# Patient Record
Sex: Female | Born: 1945 | Race: Black or African American | Hispanic: No | Marital: Married | Smoking: Former smoker
Health system: Southern US, Community
[De-identification: ages and names within clinical notes are randomized; demographics above are authoritative.]

## PROBLEM LIST (undated history)

## (undated) DIAGNOSIS — K2970 Gastritis, unspecified, without bleeding: Secondary | ICD-10-CM

## (undated) DIAGNOSIS — D649 Anemia, unspecified: Secondary | ICD-10-CM

## (undated) DIAGNOSIS — D573 Sickle-cell trait: Secondary | ICD-10-CM

## (undated) DIAGNOSIS — B029 Zoster without complications: Secondary | ICD-10-CM

## (undated) DIAGNOSIS — T7840XA Allergy, unspecified, initial encounter: Secondary | ICD-10-CM

## (undated) DIAGNOSIS — K449 Diaphragmatic hernia without obstruction or gangrene: Secondary | ICD-10-CM

## (undated) DIAGNOSIS — R519 Headache, unspecified: Secondary | ICD-10-CM

## (undated) DIAGNOSIS — Z8619 Personal history of other infectious and parasitic diseases: Secondary | ICD-10-CM

## (undated) DIAGNOSIS — N12 Tubulo-interstitial nephritis, not specified as acute or chronic: Secondary | ICD-10-CM

## (undated) DIAGNOSIS — N189 Chronic kidney disease, unspecified: Secondary | ICD-10-CM

## (undated) DIAGNOSIS — E785 Hyperlipidemia, unspecified: Secondary | ICD-10-CM

## (undated) DIAGNOSIS — R51 Headache: Secondary | ICD-10-CM

## (undated) DIAGNOSIS — Z87440 Personal history of urinary (tract) infections: Secondary | ICD-10-CM

## (undated) HISTORY — DX: Personal history of urinary (tract) infections: Z87.440

## (undated) HISTORY — DX: Hyperlipidemia, unspecified: E78.5

## (undated) HISTORY — DX: Allergy, unspecified, initial encounter: T78.40XA

## (undated) HISTORY — DX: Gastritis, unspecified, without bleeding: K29.70

## (undated) HISTORY — DX: Zoster without complications: B02.9

## (undated) HISTORY — DX: Personal history of other infectious and parasitic diseases: Z86.19

## (undated) HISTORY — DX: Chronic kidney disease, unspecified: N18.9

## (undated) HISTORY — PX: COLONOSCOPY: SHX174

## (undated) HISTORY — DX: Sickle-cell trait: D57.3

## (undated) HISTORY — PX: WISDOM TOOTH EXTRACTION: SHX21

## (undated) HISTORY — PX: UPPER GASTROINTESTINAL ENDOSCOPY: SHX188

## (undated) HISTORY — DX: Tubulo-interstitial nephritis, not specified as acute or chronic: N12

## (undated) HISTORY — DX: Diaphragmatic hernia without obstruction or gangrene: K44.9

## (undated) HISTORY — DX: Anemia, unspecified: D64.9

---

## 2002-12-11 ENCOUNTER — Emergency Department (HOSPITAL_COMMUNITY): Admission: EM | Admit: 2002-12-11 | Discharge: 2002-12-11 | Payer: Self-pay | Admitting: Emergency Medicine

## 2003-03-24 ENCOUNTER — Encounter: Admission: RE | Admit: 2003-03-24 | Discharge: 2003-03-24 | Payer: Self-pay | Admitting: Internal Medicine

## 2004-06-21 ENCOUNTER — Emergency Department (HOSPITAL_COMMUNITY): Admission: EM | Admit: 2004-06-21 | Discharge: 2004-06-21 | Payer: Self-pay | Admitting: Emergency Medicine

## 2004-10-12 ENCOUNTER — Encounter: Admission: RE | Admit: 2004-10-12 | Discharge: 2004-10-12 | Payer: Self-pay | Admitting: Internal Medicine

## 2005-12-05 ENCOUNTER — Ambulatory Visit: Payer: Self-pay | Admitting: Internal Medicine

## 2005-12-21 ENCOUNTER — Ambulatory Visit: Payer: Self-pay | Admitting: Internal Medicine

## 2005-12-21 LAB — CONVERTED CEMR LAB
Alkaline Phosphatase: 90 units/L (ref 39–117)
Basophils Relative: 0.1 % (ref 0.0–1.0)
CO2: 29 meq/L (ref 19–32)
Calcium: 9.8 mg/dL (ref 8.4–10.5)
Chol/HDL Ratio, serum: 2.9
Cholesterol: 185 mg/dL (ref 0–200)
Creatinine, Ser: 0.9 mg/dL (ref 0.4–1.2)
Eosinophil percent: 1 % (ref 0.0–5.0)
Glomerular Filtration Rate, Af Am: 82 mL/min/{1.73_m2}
Glucose, Bld: 88 mg/dL (ref 70–99)
LDL Cholesterol: 110 mg/dL — ABNORMAL HIGH (ref 0–99)
MCHC: 32.4 g/dL (ref 30.0–36.0)
MCV: 91.9 fL (ref 78.0–100.0)
Neutrophils Relative %: 57.1 % (ref 43.0–77.0)
Potassium: 3.8 meq/L (ref 3.5–5.1)
RBC: 4.21 M/uL (ref 3.87–5.11)
RDW: 12.5 % (ref 11.5–14.6)
Sodium: 146 meq/L — ABNORMAL HIGH (ref 135–145)
Triglyceride fasting, serum: 60 mg/dL (ref 0–149)
WBC: 5.5 10*3/uL (ref 4.5–10.5)

## 2005-12-29 ENCOUNTER — Encounter: Payer: Self-pay | Admitting: Internal Medicine

## 2005-12-29 ENCOUNTER — Ambulatory Visit: Payer: Self-pay | Admitting: Internal Medicine

## 2005-12-29 ENCOUNTER — Other Ambulatory Visit: Admission: RE | Admit: 2005-12-29 | Discharge: 2005-12-29 | Payer: Self-pay | Admitting: Internal Medicine

## 2007-02-11 ENCOUNTER — Ambulatory Visit: Payer: Self-pay | Admitting: Internal Medicine

## 2007-02-11 LAB — CONVERTED CEMR LAB
Glucose, Urine, Semiquant: NEGATIVE
Ketones, urine, test strip: NEGATIVE
Specific Gravity, Urine: 1.02
Urobilinogen, UA: 0.2
pH: 6

## 2007-02-12 LAB — CONVERTED CEMR LAB
ALT: 20 units/L (ref 0–35)
AST: 27 units/L (ref 0–37)
BUN: 8 mg/dL (ref 6–23)
Basophils Absolute: 0 10*3/uL (ref 0.0–0.1)
Bilirubin, Direct: 0.2 mg/dL (ref 0.0–0.3)
CO2: 27 meq/L (ref 19–32)
Calcium: 9.5 mg/dL (ref 8.4–10.5)
Creatinine, Ser: 1 mg/dL (ref 0.4–1.2)
Eosinophils Absolute: 0.1 10*3/uL (ref 0.0–0.6)
Eosinophils Relative: 1.6 % (ref 0.0–5.0)
Hemoglobin: 11.9 g/dL — ABNORMAL LOW (ref 12.0–15.0)
Monocytes Relative: 9.8 % (ref 3.0–11.0)
Neutrophils Relative %: 58.3 % (ref 43.0–77.0)
RDW: 13.3 % (ref 11.5–14.6)
Sodium: 138 meq/L (ref 135–145)
TSH: 1.93 microintl units/mL (ref 0.35–5.50)
Total CHOL/HDL Ratio: 2.5
Triglycerides: 43 mg/dL (ref 0–149)
VLDL: 9 mg/dL (ref 0–40)

## 2007-02-18 ENCOUNTER — Ambulatory Visit: Payer: Self-pay | Admitting: Internal Medicine

## 2007-02-18 ENCOUNTER — Other Ambulatory Visit: Admission: RE | Admit: 2007-02-18 | Discharge: 2007-02-18 | Payer: Self-pay | Admitting: Internal Medicine

## 2007-02-18 ENCOUNTER — Encounter: Payer: Self-pay | Admitting: Internal Medicine

## 2007-02-18 DIAGNOSIS — M25569 Pain in unspecified knee: Secondary | ICD-10-CM

## 2007-02-18 DIAGNOSIS — D649 Anemia, unspecified: Secondary | ICD-10-CM | POA: Insufficient documentation

## 2007-02-18 LAB — CONVERTED CEMR LAB: Pap Smear: NORMAL

## 2007-02-21 LAB — CONVERTED CEMR LAB
Basophils Absolute: 0 10*3/uL (ref 0.0–0.1)
Basophils Relative: 0.3 % (ref 0.0–1.0)
Eosinophils Absolute: 0.1 10*3/uL (ref 0.0–0.6)
HCT: 35.4 % — ABNORMAL LOW (ref 36.0–46.0)
Hemoglobin: 11.7 g/dL — ABNORMAL LOW (ref 12.0–15.0)
MCHC: 33.1 g/dL (ref 30.0–36.0)
Neutro Abs: 3.4 10*3/uL (ref 1.4–7.7)
Neutrophils Relative %: 60.3 % (ref 43.0–77.0)
RBC: 3.87 M/uL (ref 3.87–5.11)
WBC: 5.6 10*3/uL (ref 4.5–10.5)

## 2007-02-26 ENCOUNTER — Ambulatory Visit: Payer: Self-pay | Admitting: Gastroenterology

## 2007-03-07 ENCOUNTER — Encounter: Payer: Self-pay | Admitting: Internal Medicine

## 2007-03-21 ENCOUNTER — Encounter: Admission: RE | Admit: 2007-03-21 | Discharge: 2007-03-21 | Payer: Self-pay | Admitting: Internal Medicine

## 2007-03-21 LAB — HM MAMMOGRAPHY: HM Mammogram: NORMAL

## 2007-04-05 ENCOUNTER — Encounter: Payer: Self-pay | Admitting: Internal Medicine

## 2007-04-11 ENCOUNTER — Ambulatory Visit: Payer: Self-pay | Admitting: Gastroenterology

## 2007-04-11 ENCOUNTER — Encounter: Payer: Self-pay | Admitting: Internal Medicine

## 2007-04-11 LAB — HM COLONOSCOPY: HM Colonoscopy: NORMAL

## 2007-06-12 ENCOUNTER — Ambulatory Visit: Payer: Self-pay | Admitting: Internal Medicine

## 2009-04-05 ENCOUNTER — Ambulatory Visit: Payer: Self-pay | Admitting: Internal Medicine

## 2009-04-05 LAB — CONVERTED CEMR LAB
ALT: 12 units/L (ref 0–35)
AST: 21 units/L (ref 0–37)
Albumin: 3.6 g/dL (ref 3.5–5.2)
Bilirubin Urine: NEGATIVE
Blood in Urine, dipstick: NEGATIVE
Cholesterol: 197 mg/dL (ref 0–200)
Eosinophils Absolute: 0.1 10*3/uL (ref 0.0–0.7)
GFR calc non Af Amer: 93.26 mL/min (ref >60)
Glucose, Bld: 78 mg/dL (ref 70–99)
HCT: 34.2 % — ABNORMAL LOW (ref 36.0–46.0)
HDL: 76.6 mg/dL (ref >39.00)
Ketones, urine, test strip: NEGATIVE
Lymphocytes Relative: 32.3 % (ref 12.0–46.0)
MCHC: 32.7 g/dL (ref 30.0–36.0)
MCV: 94.8 fL (ref 78.0–100.0)
Monocytes Relative: 8.3 % (ref 3.0–12.0)
Nitrite: NEGATIVE
Platelets: 224 10*3/uL (ref 150.0–400.0)
RBC: 3.6 M/uL — ABNORMAL LOW (ref 3.87–5.11)
RDW: 13.2 % (ref 11.5–14.6)
Specific Gravity, Urine: 1.02
TSH: 1.26 microintl units/mL (ref 0.35–5.50)
Total CHOL/HDL Ratio: 3
Total Protein: 6.9 g/dL (ref 6.0–8.3)
Triglycerides: 55 mg/dL (ref 0.0–149.0)
Urobilinogen, UA: 0.2
WBC: 5.4 10*3/uL (ref 4.5–10.5)
pH: 5.5

## 2009-04-12 ENCOUNTER — Ambulatory Visit: Payer: Self-pay | Admitting: Internal Medicine

## 2009-04-12 ENCOUNTER — Other Ambulatory Visit
Admission: RE | Admit: 2009-04-12 | Discharge: 2009-04-12 | Payer: Self-pay | Source: Home / Self Care | Admitting: Internal Medicine

## 2009-04-12 DIAGNOSIS — J309 Allergic rhinitis, unspecified: Secondary | ICD-10-CM | POA: Insufficient documentation

## 2009-04-12 LAB — CONVERTED CEMR LAB
Bilirubin Urine: NEGATIVE
Blood in Urine, dipstick: NEGATIVE
Glucose, Urine, Semiquant: NEGATIVE
Ketones, urine, test strip: NEGATIVE
Protein, U semiquant: NEGATIVE

## 2009-04-12 LAB — HM PAP SMEAR

## 2009-04-13 ENCOUNTER — Telehealth: Payer: Self-pay | Admitting: Internal Medicine

## 2009-04-14 LAB — CONVERTED CEMR LAB: Pap Smear: NEGATIVE

## 2009-06-08 ENCOUNTER — Ambulatory Visit: Payer: Self-pay | Admitting: Internal Medicine

## 2009-06-08 DIAGNOSIS — D573 Sickle-cell trait: Secondary | ICD-10-CM

## 2009-06-08 LAB — CONVERTED CEMR LAB
Basophils Absolute: 0 10*3/uL (ref 0.0–0.1)
IgA: 183 mg/dL (ref 68–378)
Iron: 57 ug/dL (ref 42–145)
Lymphocytes Relative: 25.2 % (ref 12.0–46.0)
Lymphs Abs: 1.7 10*3/uL (ref 0.7–4.0)
MCV: 93 fL (ref 78.0–100.0)
Monocytes Absolute: 0.6 10*3/uL (ref 0.1–1.0)
RBC: 3.69 M/uL — ABNORMAL LOW (ref 3.87–5.11)
Transferrin: 245.8 mg/dL (ref 212.0–360.0)

## 2009-06-16 LAB — CONVERTED CEMR LAB
Hgb A2 Quant: 3.2 % (ref 2.2–3.2)
Hgb A: 60.1 % — ABNORMAL LOW (ref 96.8–97.8)

## 2009-06-25 ENCOUNTER — Encounter (INDEPENDENT_AMBULATORY_CARE_PROVIDER_SITE_OTHER): Payer: Self-pay | Admitting: *Deleted

## 2009-09-13 ENCOUNTER — Ambulatory Visit: Payer: Self-pay | Admitting: Gastroenterology

## 2009-09-13 DIAGNOSIS — D509 Iron deficiency anemia, unspecified: Secondary | ICD-10-CM

## 2009-10-06 ENCOUNTER — Ambulatory Visit: Payer: Self-pay | Admitting: Gastroenterology

## 2009-10-07 ENCOUNTER — Ambulatory Visit: Payer: Self-pay | Admitting: Gastroenterology

## 2009-10-08 ENCOUNTER — Encounter: Payer: Self-pay | Admitting: Gastroenterology

## 2009-10-08 LAB — CONVERTED CEMR LAB
Fecal Occult Blood: NEGATIVE
OCCULT 3: NEGATIVE

## 2010-02-13 ENCOUNTER — Encounter: Payer: Self-pay | Admitting: Internal Medicine

## 2010-02-22 NOTE — Assessment & Plan Note (Signed)
Summary: anemia...em   History of Present Illness Visit Type: Initial Consult Primary GI MD: Elie Goody MD [facility] Primary Provider: Berniece Andreas, MD Requesting Provider: Berniece Andreas, MD Chief Complaint: Iron Def Anemia, patient states that this has been an ongoing problem most of her adult life. History of Present Illness:   This is a 65 year old female who has a history of recurrent iron deficiency anemia and sickle cell trait. She states she was placed on iron earlier this year. Despite taking iron for several months, she remains iron deficient with a recent hemoglobin of 11.6. She underwent colonoscopy in March 2009, which was normal. She has no ongoing gastrointestinal complaints. Celiac antibodies were recently negative.  Iron                            57 ug/dL                    11-914 Transferrin               245.8 mg/dL                 782.9-562.1 Iron Saturation      [L]  16.6 %                      20.0-50.0   GI Review of Systems      Denies abdominal pain, acid reflux, belching, bloating, chest pain, dysphagia with liquids, dysphagia with solids, heartburn, loss of appetite, nausea, vomiting, vomiting blood, weight loss, and  weight gain.        Denies anal fissure, black tarry stools, change in bowel habit, constipation, diarrhea, diverticulosis, fecal incontinence, heme positive stool, hemorrhoids, irritable bowel syndrome, jaundice, light color stool, liver problems, rectal bleeding, and  rectal pain.   Current Medications (verified): 1)  Vitamin D 1000 Unit  Tabs (Cholecalciferol) 2)  Multivitamins   Tabs (Multiple Vitamin) 3)  Nasonex 50 Mcg/act Susp (Mometasone Furoate) .... 2 Sprays in Each Nostril Once Daily 4)  Iron .Marland Kitchen.. 65 Mg 1 X A Day  Allergies (verified): 1)  ! Penicillin  Past History:  Past Medical History: G3P3 chickenpox Allergic rhinitis Hyperlipidemia Urinary Tract Infection Sickle cell trait Fe def anemia  Past Surgical  History: Reviewed history from 02/18/2007 and no changes required. Denies surgical history  Family History: Reviewed history from 06/08/2009 and no changes required. Family History Hypertension mom chf   Raised  not by biologic     Social History: Reviewed history from 04/12/2009 and no changes required. divorced  Former Smoker Alcohol use-yes Drug use-no Regular exercise-yes Occupation:cma   night shift.   Household of 1 no pets [place] Raised by family member not mom. Divorced  Review of Systems       The patient complains of allergy/sinus, change in vision, and headaches-new.  The patient denies anemia, anxiety-new, arthritis/joint pain, back pain, blood in urine, breast changes/lumps, confusion, cough, coughing up blood, depression-new, fainting, fatigue, fever, hearing problems, heart murmur, heart rhythm changes, itching, menstrual pain, muscle pains/cramps, night sweats, nosebleeds, pregnancy symptoms, shortness of breath, skin rash, sleeping problems, sore throat, swelling of feet/legs, swollen lymph glands, thirst - excessive, urination - excessive, urination changes/pain, urine leakage, vision changes, and voice change.    Vital Signs:  Patient profile:   65 year old female Menstrual status:  postmenopausal Height:      66 inches Weight:  182.25 pounds BMI:     29.52 Pulse rate:   64 / minute Pulse rhythm:   regular BP sitting:   110 / 68  (left arm) Cuff size:   regular  Vitals Entered By: June McMurray [name] Duncan Dull) (September 13, 2009 9:47 AM)  Physical Exam  General:  Well developed, well nourished, no acute distress. Head:  Normocephalic and atraumatic. Eyes:  PERRLA, no icterus. Ears:  Normal auditory acuity. Mouth:  No deformity or lesions, dentition normal. Neck:  Supple; no masses or thyromegaly. Lungs:  Clear throughout to auscultation. Heart:  Regular rate and rhythm; no murmurs, rubs,  or bruits. Abdomen:  Soft, nontender and nondistended. No  masses, hepatosplenomegaly or hernias noted. Normal bowel sounds. Rectal:  deferred until time of colonoscopy.   Msk:  Symmetrical with no gross deformities. Normal posture. Pulses:  Normal pulses noted. Extremities:  No clubbing, cyanosis, edema or deformities noted. Neurologic:  Alert and  oriented x4;  grossly normal neurologically. Cervical Nodes:  No significant cervical adenopathy. Inguinal Nodes:  No significant inguinal adenopathy. Psych:  Alert and cooperative. Normal mood and affect.  Impression & Recommendations:  Problem # 1:  ANEMIA, IRON DEFICIENCY (ICD-280.9) Persistent iron deficiency anemia. Increased iron to TID with meals. Obtain stool Hemoccults. Rule out occult gastrointestinal losses. The risks, benefits and alternatives to endoscopy with possible biopsy and possible dilation were discussed with the patient and they consent to proceed. The procedure will be scheduled electively. Orders: EGD (EGD) [facility] Hemoccult Cards #3 (take home) (Hem cards #3)  Patient Instructions: 1)  Follow instructions on Hemoccult cards off iron x 5 days and mail them back to [place] when finished.  2)  Restart iron after you have finished the Hemoccult cards, taking one tablet by mouth three times a day with meals.  3)  Upper Endoscopy brochure given.  4)  Copy sent to : Berniece Andreas, MD 5)  The medication list was reviewed and reconciled.  All changed / newly prescribed medications were explained.  A complete medication list was provided to the patient / caregiver.

## 2010-02-22 NOTE — Letter (Signed)
Summary: EGD Instructions  [facility]  [address] [place], [place] [address]   Phone: [phone]  Fax: [phone]       Cathy Meyers    30-Aug-1945    MRN: [id number]       Procedure Day /Date: Thursday September 19th, 2011     Arrival Time:  9:30am     Procedure Time: 10:30am     Location of Procedure:                    _x  _ [facility] (4th Floor)    PREPARATION FOR ENDOSCOPY   On 10/07/09 THE DAY OF THE PROCEDURE:  1.   No solid foods, milk or milk products are allowed after midnight the night before your procedure.  2.   Do not drink anything colored red or purple.  Avoid juices with pulp.  No orange juice.  3.  You may drink clear liquids until 8:30am, which is 2 hours before your procedure.                                                                                                CLEAR LIQUIDS INCLUDE: Water Jello Ice Popsicles Tea (sugar ok, no milk/cream) Powdered fruit flavored drinks Coffee (sugar ok, no milk/cream) Gatorade Juice: apple, white grape, white cranberry  Lemonade Clear bullion, consomm, broth Carbonated beverages (any kind) Strained chicken noodle soup Hard Candy   MEDICATION INSTRUCTIONS  Unless otherwise instructed, you should take regular prescription medications with a small sip of water as early as possible the morning of your procedure.        OTHER INSTRUCTIONS  You will need a responsible adult at least 64 years of age to accompany you and drive you home.   This person must remain in the waiting room during your procedure.  Wear loose fitting clothing that is easily removed.  Leave jewelry and other valuables at home.  However, you may wish to bring a book to read or an iPod/MP3 player to listen to music as you wait for your procedure to start.  Remove all body piercing jewelry and leave at home.  Total time from sign-in until discharge is approximately 2-3 hours.  You should go home  directly after your procedure and rest.  You can resume normal activities the day after your procedure.  The day of your procedure you should not:   Drive   Make legal decisions   Operate machinery   Drink alcohol   Return to work  You will receive specific instructions about eating, activities and medications before you leave.    The above instructions have been reviewed and explained to me by   _______________________    I fully understand and can verbalize these instructions _____________________________ Date _________

## 2010-02-22 NOTE — Miscellaneous (Signed)
Summary: rx for omeprazole  Clinical Lists Changes  Medications: Added new medication of OMEPRAZOLE 20 MG  CPDR (OMEPRAZOLE) 1 each day 30 minutes before meal - Signed Rx of OMEPRAZOLE 20 MG  CPDR (OMEPRAZOLE) 1 each day 30 minutes before meal;  #30 x 2;  Signed;  Entered by: Oda Cogan RN;  Authorized by: Meryl Dare MD [facility];  Method used: Electronically to [id number] [address], [place], [place]  [address], Ph: [phone] or [phone], Fax: [phone]    Prescriptions: OMEPRAZOLE 20 MG  CPDR (OMEPRAZOLE) 1 each day 30 minutes before meal  #30 x 2   Entered by:   Oda Cogan RN   Authorized by:   Meryl Dare MD [facility]   Signed by:   Oda Cogan RN on 10/07/2009   Method used:   Electronically to        [id number] (retail)       [address]       [place], [place]  [address]       Ph: [phone] or [phone]       Fax: [phone]   RxID:   [id number]

## 2010-02-22 NOTE — Letter (Signed)
Summary: New Patient letter  [facility]  [address] [place], [place] [address]   Phone: [phone]  Fax: [phone]       06/25/2009 MRN: [id number]  [facility] [address] [place], [place]  [address]  Dear [name],  Welcome to the Gastroenterology Division at [facility].    You are scheduled to see Dr.  Claudette Head on September 13, 2009 at 10:00am on the 3rd floor at [facility], [address]  We ask that you try to arrive at our office 15 minutes prior to your appointment time to allow for check-in.  We would like you to complete the enclosed self-administered evaluation form prior to your visit and bring it with you on the day of your appointment.  We will review it with you.  Also, please bring a complete list of all your medications or, if you prefer, bring the medication bottles and we will list them.  Please bring your insurance card so that we may make a copy of it.  If your insurance requires a referral to see a specialist, please bring your referral form from your primary care physician.  Co-payments are due at the time of your visit and may be paid by cash, check or credit card.     Your office visit will consist of a consult with your physician (includes a physical exam), any laboratory testing he/she may order, scheduling of any necessary diagnostic testing (e.g. x-ray, ultrasound, CT-scan), and scheduling of a procedure (e.g. Endoscopy, Colonoscopy) if required.  Please allow enough time on your schedule to allow for any/all of these possibilities.    If you cannot keep your appointment, please call [phone] to cancel or reschedule prior to your appointment date.  This allows [place] the opportunity to schedule an appointment for another patient in need of care.  If you do not cancel or reschedule by 5 p.m. the business day prior to your appointment date, you will be charged a $50.00 late cancellation/no-show fee.    Thank  you for choosing [facility] for your medical needs.  We appreciate the opportunity to care for you.  Please visit [place] at our website  to learn more about our practice.                     Sincerely,                                                             The Gastroenterology Division

## 2010-02-22 NOTE — Assessment & Plan Note (Signed)
Summary: 2 month fup//ccm   Vital Signs:  Patient profile:   65 year old female Menstrual status:  postmenopausal Height:      63.5 inches Weight:      179 pounds BMI:     31.32 Temp:     98.2 degrees F oral Pulse rate:   80 / minute BP sitting:   120 / 80  (right arm) CC: ReCheck hemo globin   History of Present Illness: Cathy Meyers comes  in comes in today  for   follow up of anemia  taking 1 iron tab per day 325 ferrous sulfate and no se 65 elemental iron.  NO bleeding and no other change in status   .     No fam hx of anemia . says had sickel trait   and anemia when younger but levels would get to normal when took iron. No easy bleeding and had colonoscopy by report  in past 2 years . No bleeding and heels ok otherwise.   No fam hx of sig gi disease. no transfusion. No blood donation and ois post menopausal  Preventive Screening-Counseling & Management  Alcohol-Tobacco     Alcohol drinks/day: <1     Alcohol type: mixed drinks     Smoking Status: quit     Year Quit: 25 years ago  Caffeine-Diet-Exercise     Caffeine use/day: 1 large      Does Patient Exercise: yes     Type of exercise: treadmill and leg lifts, wts     Times/week: 1  Current Medications (verified): 1)  Vitamin D 1000 Unit  Tabs (Cholecalciferol) 2)  Multivitamins   Tabs (Multiple Vitamin) 3)  Nasonex 50 Mcg/act Susp (Mometasone Furoate) .... 2 Sprays in Each Nostril Once Daily 4)  Iron .Marland Kitchen.. 65 Mg 1 X A Day  Allergies (verified): 1)  ! Penicillin  Past History:  Past medical, surgical, family and social histories (including risk factors) reviewed for relevance to current acute and chronic problems.  Past Medical History: Unremarkable G3P3 chickenpox Allergic rhinitis Hx of anemia  has Sickle trait  ? if iron deficint in the past  Past Surgical History: Reviewed history from 02/18/2007 and no changes required. Denies surgical history  Family History: Reviewed history from 02/18/2007 and no  changes required. Family History Hypertension mom chf   Raised  not by biologic     Social History: Reviewed history from 04/12/2009 and no changes required. divorced  Former Smoker Alcohol use-yes Drug use-no Regular exercise-yes Occupation:cma   night shift.   Household of 1 no pets [place] Raised by family member not mom. Divorced  Review of Systems  The patient denies anorexia, fever, weight loss, weight gain, melena, hematochezia, abnormal bleeding, enlarged lymph nodes, chest pain, syncope, dyspnea on exertion, abdominal pain, hematuria, difficulty walking, and angioedema.    Physical Exam  General:  alert, well-developed, well-nourished, and well-hydrated.   Skin:  turgor normal, color normal, no ecchymoses, and no petechiae.     Impression & Recommendations:  Problem # 1:  UNSPECIFIED ANEMIA (ICD-285.9) hx of anemia iron in past ?    also says she has sickle trait .    she is  post menopausal     and will follow up . says had colon in 2009 at [facility]  no report in EMR.  She has a hx of sickle trait  .  No other familial  anemia . consider celiac  malabsorption etc.  Orders: T- * Misc. Laboratory test [phone])  T-Celiac Disease Ab Evaluation (8002) T-Hemoglobin Electrophoresis ([address]) Venipuncture ([address]) TLB-CBC Platelet - w/Differential (85025-CBCD) TLB-IBC Pnl (Iron/FE;Transferrin) (83550-IBC) TLB-IgA (Immunoglobulin A) (82784-IGA)  Complete Medication List: 1)  Vitamin D 1000 Unit Tabs (Cholecalciferol) 2)  Multivitamins Tabs (Multiple vitamin) 3)  Nasonex 50 Mcg/act Susp (Mometasone furoate) .... 2 sprays in each nostril once daily 4)  Iron  .Marland Kitchen.. 65 mg 1 x a day  Patient Instructions: 1)  continue the iron for now 2)  You will be informed of lab results when available. and then plan follow up . 3)  Call in meantime if you have concerns.  Prevention & Chronic Care Immunizations   Influenza vaccine: Not documented    Tetanus booster: 01/24/2004:  Historical    Pneumococcal vaccine: Not documented    H. zoster vaccine: Not documented  Colorectal Screening   Hemoccult: Not documented    Colonoscopy: normal  (04/11/2007)  Other Screening   Pap smear: NEGATIVE FOR INTRAEPITHELIAL LESIONS OR MALIGNANCY.  (04/12/2009)    Mammogram: normal  (03/21/2007)    DXA bone density scan: Not documented   Smoking status: quit  (06/08/2009)  Lipids   Total Cholesterol: 197  (04/05/2009)   LDL: 109  (04/05/2009)   LDL Direct: 104.4  (02/11/2007)   HDL: 76.60  (04/05/2009)   Triglycerides: 55.0  (04/05/2009)   Preventive Care Screening  Prior Values:    Pap Smear:  NEGATIVE FOR INTRAEPITHELIAL LESIONS OR MALIGNANCY. (04/12/2009)    Mammogram:  normal (03/21/2007)    Colonoscopy:  normal (04/11/2007)    Last Tetanus Booster:  Historical (01/24/2004)  review of records .

## 2010-02-22 NOTE — Assessment & Plan Note (Signed)
Summary: cpx Cira Servant see other mrn for history  pt rsc/njr   Vital Signs:  Patient profile:   65 year old female Menstrual status:  postmenopausal Height:      63.5 inches Weight:      179 pounds BMI:     31.32 Pulse rate:   72 / minute BP sitting:   130 / 80  (right arm) Cuff size:   regular  Vitals Entered By: Romualdo Bolk, [name] Duncan Dull) (April 12, 2009 9:35 AM) CC: CPX-Discuss doing a pap     Menstrual Status postmenopausal Last PAP Result normal   History of Present Illness: Cathy Meyers comes in today for   for preventive visit  her last visit here was at the end of 2009  since that time  no major change in health status . Needs refill for allergy med. Nose spray helps.   Preventive Care Screening  Colonoscopy:    Date:  04/11/2007    Results:  normal   Mammogram:    Date:  03/21/2007    Results:  normal   Pap Smear:    Date:  02/18/2007    Results:  normal   Last Tetanus Booster:    Date:  01/24/2004    Results:  Historical    Preventive Screening-Counseling & Management  Alcohol-Tobacco     Alcohol drinks/day: <1     Alcohol type: mixed drinks     Smoking Status: quit     Year Quit: 25 years ago  Caffeine-Diet-Exercise     Caffeine use/day: 1 large      Does Patient Exercise: yes     Type of exercise: treadmill and leg lifts, wts     Times/week: 1  Hep-HIV-STD-Contraception     Dental Visit-last 6 months yes  Safety-Violence-Falls     Seat Belt Use: yes     Firearms in the Home: no firearms in the home     Smoke Detectors: yes      Blood Transfusions:  no.    Current Medications (verified): 1)  Vitamin D 1000 Unit  Tabs (Cholecalciferol) 2)  Multivitamins   Tabs (Multiple Vitamin)  Allergies (verified): 1)  ! Penicillin  Past History:  Past medical, surgical, family and social histories (including risk factors) reviewed, and no changes noted (except as noted below).  Past Medical History: Unremarkable G3P3 chickenpox Allergic  rhinitis Hx of anemia  Past Surgical History: Reviewed history from 02/18/2007 and no changes required. Denies surgical history  Past History:  Care Management: Orthopedics: Unsure of name  Family History: Reviewed history from 02/18/2007 and no changes required. Family History Hypertension mom chf   Social History: Reviewed history from 02/18/2007 and no changes required. divorced  Former Smoker Alcohol use-yes Drug use-no Regular exercise-yes Occupation:cma   night shift.   Household of 1 no pets [place] Raised by family member not mom. Divorced Caffeine use/day:  1 large  Seat Belt Use:  yes Dental Care w/in 6 mos.:  yes Blood Transfusions:  no  Review of Systems  The patient denies anorexia, fever, weight loss, weight gain, vision loss, decreased hearing, hoarseness, chest pain, syncope, dyspnea on exertion, peripheral edema, prolonged cough, hemoptysis, abdominal pain, melena, hematochezia, severe indigestion/heartburn, hematuria, incontinence, genital sores, muscle weakness, transient blindness, difficulty walking, depression, unusual weight change, abnormal bleeding, enlarged lymph nodes, angioedema, and breast masses.   Physical Exam General Appearance: well developed, well nourished, no acute distress Eyes: conjunctiva and lids normal, PERRLA, EOMI, wnl.  Ears, Nose, Mouth, Throat:  TM clear, nares clear, oral exam WNL Neck: supple, no lymphadenopathy, no thyromegaly, no JVD Respiratory: clear to auscultation and percussion, respiratory effort normal Cardiovascular: regular rate and rhythm, S1-S2, no murmur, rub or gallop, no bruits, peripheral pulses normal and symmetric, no cyanosis, clubbing, edema or varicosities Chest: no scars, masses, tenderness; no asymmetry, skin changes, nipple discharge   Gastrointestinal: soft, non-tender; no hepatosplenomegaly, masses; active bowel sounds all quadrants, guaiac negative stool; no masses, tenderness, hemorrhoids    Genitourinary: no vaginal discharge, lesions; no masses or tenderness   Lymphatic: no cervical, axillary or inguinal adenopathy Musculoskeletal: gait normal, muscle tone and strength WNL, no joint swelling, effusions, discoloration, crepitus  Skin: clear, good turgor, color WNL, no rashes, lesions, or ulcerations Neurologic: normal mental status, normal reflexes, normal strength, sensation, and motion Psychiatric: alert; oriented to person, place and time Other Exam: see labs nol excep hg 11.2     Impression & Recommendations:  Problem # 1:  PREVENTIVE HEALTH CARE (ICD-V70.0) Discussed nutrition,exercise,diet,healthy weight, vitamin D and calcium.   Problem # 2:  ROUTINE GYNECOLOGICAL EXAMINATION (ICD-V72.31)  Orders: Pap Smear, Thin Prep ( Collection of) (Z6109)  Problem # 3:  ALLERGIC RHINITIS (ICD-477.9) rx nasacort but insurance   may have her change med. Her updated medication list for this problem includes:    Nasonex 50 Mcg/act Susp (Mometasone furoate) .Marland Kitchen... 2 sprays in each nostril once daily  Orders: Prescription Created Electronically [phone])  Problem # 4:  UNSPECIFIED ANEMIA (ICD-285.9) will review your record   take vitamins with iron and  recheck ov   to check hg in   patient says she has alway had this problem  since a youngster but is not aware of familial anemia.  Had Gi consult  a year ago and colonoscopy    but dont see consult in the record.       Patient Instructions: 1)  try  nasal steroid  in season.   2)  Some "sinus HAs " are   migraines.  3)  Get eye  exam.  4)  take Iron pill  once daily  and then rov in 2 months so we can recheck your hemoglobin. Prescriptions: NASACORT AQ 55 MCG/ACT AERS (TRIAMCINOLONE ACETONIDE(NASAL)) 2 spray s each nostril once a day for allergy control  #1 x 12   Entered and Authorized by:   Madelin Headings MD   Signed by:   Madelin Headings MD on 04/12/2009   Method used:   Electronically to        [facility]ail)       [address]       [place], [place]  [address]       Ph: [phone] or [phone]       Fax: [phone]   RxID:   [id number]   Laboratory Results   Urine Tests    Routine Urinalysis   Color: yellow Appearance: Clear Glucose: negative   (Normal Range: Negative) Bilirubin: negative   (Normal Range: Negative) Ketone: negative   (Normal Range: Negative) Spec. Gravity: 1.015   (Normal Range: 1.003-1.035) Blood: negative   (Normal Range: Negative) pH: 6.0   (Normal Range: 5.0-8.0) Protein: negative   (Normal Range: Negative) Urobilinogen: 0.2   (Normal Range: 0-1) Nitrite: negative   (Normal Range: Negative) Leukocyte Esterace: negative   (Normal Range: Negative)    Comments: Rita Ohara  April 12, 2009 11:58 AM     reveiwed record and colon nl .

## 2010-02-22 NOTE — Progress Notes (Signed)
Summary: prior auth  Phone Note Outgoing Call   Call placed by: Romualdo Bolk, [name] Duncan Dull),  April 13, 2009 3:10 PM Call placed to: Patient Summary of Call: Pt needs a prior auth on nasacort aq. Per Dr. Fabian Sharp- have pt try nasonex 2 sprays in each nostril daily since ins will not pay for nasacort aq Initial call taken by: Romualdo Bolk, [name] Duncan Dull),  April 13, 2009 3:13 PM  Follow-up for Phone Call        Pt aware and rx sent to pharmacy. Follow-up by: Romualdo Bolk, [name] (AAMA),  April 13, 2009 4:30 PM    New/Updated Medications: NASONEX 50 MCG/ACT SUSP (MOMETASONE FUROATE) 2 sprays in each nostril once daily Prescriptions: NASONEX 50 MCG/ACT SUSP (MOMETASONE FUROATE) 2 sprays in each nostril once daily  #1 x 12   Entered by:   Romualdo Bolk, [name] (AAMA)   Authorized by:   Madelin Headings MD   Signed by:   Romualdo Bolk, [name] (AAMA) on 04/13/2009   Method used:   Electronically to        [facility] (retail[facility]       [address]       [place], [place]  [address]       Ph: [phone] or [phone]       Fax: [phone]   RxID:   [id number]

## 2010-02-22 NOTE — Procedures (Signed)
Summary: Upper Endoscopy  Patient: Cathy Meyers Note: All result statuses are Final unless otherwise noted.  Tests: (1) Upper Endoscopy (EGD)   EGD Upper Endoscopy       DONE     [facility]     [address]     [place], [place]  [address]           ENDOSCOPY PROCEDURE REPORT     PATIENT:  Cathy, Meyers  MR#:  [id number]     BIRTHDATE:  1945-04-24, 64 yrs. old  GENDER:  female     ENDOSCOPIST:  Judie Petit T. Russella Dar, MD, [facility]           PROCEDURE DATE:  10/07/2009     PROCEDURE:  EGD with biopsy     ASA CLASS:  Class II     INDICATIONS:  iron deficiency anemia     MEDICATIONS:  Fentanyl 50 mcg IV, Versed 8 mg IV     TOPICAL ANESTHETIC:  Exactacain Spray     DESCRIPTION OF PROCEDURE:   After the risks benefits and     alternatives of the procedure were thoroughly explained, informed     consent was obtained.  The LB GIF-H180 [id number] endoscope was     introduced through the mouth and advanced to the second portion of     the duodenum, without limitations.  The instrument was slowly     withdrawn as the mucosa was fully examined.     <<PROCEDUREIMAGES>>     Moderate gastritis was found in the antrum. It was erosive.     Multiple biopsies were obtained and sent to pathology. The     remainder of the stomach was normal in appearance. The esophagus     and gastroesophageal junction were completely normal in     appearance.  The duodenal bulb was normal in appearance, as was     the postbulbar duodenum. Multiple biopsies were obtained and sent     to pathology.   Retroflexed views revealed a hiatal hernia, small.     The scope was then withdrawn from the patient and the procedure     completed.           COMPLICATIONS:  None           ENDOSCOPIC IMPRESSION:     1) Moderate gastritis in the antrum     2) Small hiatal hernia           RECOMMENDATIONS:     1) avoid ASA/NSAIDS     2) omeprazole 20mg  po qam, #30, 2 refills     3) await pathology     4) F/U with Dr. Fabian Sharp as  planned           Venita Lick. Russella Dar, MD, [facility]           CC:  Madelin Headings, MD           n.     Rosalie DoctorVenita Lick. Stark at 10/07/2009 11:23 AM           Aron Baba, [id number]  Note: An exclamation mark (!) indicates a result that was not dispersed into the flowsheet. Document Creation Date: 10/07/2009 11:23 AM _______________________________________________________________________  (1) Order result status: Final Collection or observation date-time: 10/07/2009 11:10 Requested date-time:  Receipt date-time:  Reported date-time:  Referring Physician:   Ordering Physician: Claudette Head [phone]) Specimen Source:  Source: Launa Grill Order Number: [id number] Lab site:   Appended Document: Upper  Endoscopy reviewed endo  she is du for check up with labs in Feb  march 2012 and we can check her  blood count then with cpx labs.   add  ibc panel dx anemia.  Appended Document: Upper Endoscopy Pt aware and appts made

## 2010-02-22 NOTE — Letter (Signed)
Summary: Patient [facility] Biopsy Results  [facility]  [address] [place], [place] [address]   Phone: [phone]  Fax: [phone]        October 08, 2009 MRN: [id number]    [facility] [address] [place], [place]  [address]    Dear Ms. HENRY,  I am pleased to inform you that the biopsies taken during your recent endoscopic examination did not show any evidence of cancer upon pathologic examination. The biopsies showed gastritis.  Continue with the treatment plan as outlined on the day of your      exam.  Please call us if you are having persistent problems or have questions about your condition that have not been fully answered at this time.  Sincerely,  Meryl Dare MD [facility]  This letter has been electronically signed by your physician.  Appended Document: Patient Notice-Endo Biopsy Results letter mailed

## 2010-04-08 ENCOUNTER — Other Ambulatory Visit: Payer: PRIVATE HEALTH INSURANCE | Admitting: Internal Medicine

## 2010-04-08 ENCOUNTER — Encounter: Payer: Self-pay | Admitting: Internal Medicine

## 2010-04-08 DIAGNOSIS — Z0000 Encounter for general adult medical examination without abnormal findings: Secondary | ICD-10-CM

## 2010-04-08 DIAGNOSIS — E785 Hyperlipidemia, unspecified: Secondary | ICD-10-CM

## 2010-04-08 DIAGNOSIS — D6489 Other specified anemias: Secondary | ICD-10-CM

## 2010-04-08 LAB — CBC WITH DIFFERENTIAL/PLATELET
Basophils Relative: 0.4 % (ref 0.0–3.0)
Eosinophils Relative: 1.4 % (ref 0.0–5.0)
Lymphs Abs: 2 10*3/uL (ref 0.7–4.0)
Neutrophils Relative %: 63.7 % (ref 43.0–77.0)
Platelets: 284 10*3/uL (ref 150.0–400.0)
RBC: 3.82 Mil/uL — ABNORMAL LOW (ref 3.87–5.11)
RDW: 14 % (ref 11.5–14.6)
WBC: 7.8 10*3/uL (ref 4.5–10.5)

## 2010-04-08 LAB — POCT URINALYSIS DIPSTICK
Bilirubin, UA: NEGATIVE
Glucose, UA: NEGATIVE
Ketones, UA: NEGATIVE
Spec Grav, UA: 1.01

## 2010-04-08 LAB — BASIC METABOLIC PANEL
BUN: 15 mg/dL (ref 6–23)
CO2: 29 mEq/L (ref 19–32)
Calcium: 9.7 mg/dL (ref 8.4–10.5)
Chloride: 109 mEq/L (ref 96–112)
GFR: 87.87 mL/min (ref >60.00)
Sodium: 145 mEq/L (ref 135–145)

## 2010-04-08 LAB — HEPATIC FUNCTION PANEL
Alkaline Phosphatase: 99 U/L (ref 39–117)
Total Bilirubin: 0.7 mg/dL (ref 0.3–1.2)
Total Protein: 7.3 g/dL (ref 6.0–8.3)

## 2010-04-08 LAB — LDL CHOLESTEROL, DIRECT: Direct LDL: 120.2 mg/dL

## 2010-04-08 LAB — LIPID PANEL
Cholesterol: 205 mg/dL — ABNORMAL HIGH (ref 0–200)
Triglycerides: 67 mg/dL (ref 0.0–149.0)
VLDL: 13.4 mg/dL (ref 0.0–40.0)

## 2010-04-08 LAB — IBC PANEL
Saturation Ratios: 20.8 % (ref 20.0–50.0)
Transferrin: 257.7 mg/dL (ref 212.0–360.0)

## 2010-04-15 ENCOUNTER — Ambulatory Visit: Payer: Self-pay | Admitting: Internal Medicine

## 2010-04-27 ENCOUNTER — Other Ambulatory Visit (HOSPITAL_COMMUNITY)
Admission: RE | Admit: 2010-04-27 | Discharge: 2010-04-27 | Disposition: A | Discharge status: Home / Self Care | Payer: PRIVATE HEALTH INSURANCE | Source: Ambulatory Visit | Attending: Internal Medicine | Admitting: Internal Medicine

## 2010-04-27 ENCOUNTER — Ambulatory Visit (INDEPENDENT_AMBULATORY_CARE_PROVIDER_SITE_OTHER): Payer: PRIVATE HEALTH INSURANCE | Admitting: Internal Medicine

## 2010-04-27 ENCOUNTER — Encounter: Payer: Self-pay | Admitting: Internal Medicine

## 2010-04-27 VITALS — BP 120/80 | HR 66 | Ht 63.5 in | Wt 187.0 lb

## 2010-04-27 DIAGNOSIS — J309 Allergic rhinitis, unspecified: Secondary | ICD-10-CM

## 2010-04-27 DIAGNOSIS — D509 Iron deficiency anemia, unspecified: Secondary | ICD-10-CM

## 2010-04-27 DIAGNOSIS — K2970 Gastritis, unspecified, without bleeding: Secondary | ICD-10-CM

## 2010-04-27 DIAGNOSIS — Z0000 Encounter for general adult medical examination without abnormal findings: Secondary | ICD-10-CM

## 2010-04-27 DIAGNOSIS — Z136 Encounter for screening for cardiovascular disorders: Secondary | ICD-10-CM

## 2010-04-27 DIAGNOSIS — K2990 Gastroduodenitis, unspecified, without bleeding: Secondary | ICD-10-CM

## 2010-04-27 DIAGNOSIS — Z01419 Encounter for gynecological examination (general) (routine) without abnormal findings: Secondary | ICD-10-CM

## 2010-04-27 DIAGNOSIS — K449 Diaphragmatic hernia without obstruction or gangrene: Secondary | ICD-10-CM | POA: Insufficient documentation

## 2010-04-27 MED ORDER — MOMETASONE FUROATE 50 MCG/ACT NA SUSP: 2.0000 | Freq: Every day | NASAL | Status: DC

## 2010-04-27 MED ORDER — OMEPRAZOLE 20 MG PO CPDR: DELAYED_RELEASE_CAPSULE | ORAL | Status: DC

## 2010-04-27 NOTE — Assessment & Plan Note (Signed)
Anemia improved but not totally normal   we'll use Prilosec as needed and decrease the iron to once a day for about a month and recheck her CBC iron studies in about 6 months.  If she drifts back down again will make a different treatment plan.

## 2010-04-27 NOTE — Patient Instructions (Addendum)
Continue iron once a day for the next months and then can stop. Take prilosec  When or if you nee to take advil or aleve. If having to take medication a lot then call for Office visit. Recheck cbcdiff and IC in 6 months  Then OV  Will let you know about pap results.

## 2010-04-27 NOTE — Progress Notes (Signed)
Subjective:    Patient ID: Cathy Meyers, female    DOB: [birth year], 65 y.o.   MRN: [id number]  HPI Patient comes in today for wellness visit. Since her last visit she has had no major changes in her health status.  In the process of her evaluation for her mild iron deficiency anemia she had seen Dr. Russella Dar who did an endoscopy which showed mild antral gastritis negative biopsies. Small HH She was put on Prilosec every day for 3 months she is now out of the medication.  No history of bleeding nausea vomiting change in bowel habits.   Review of Systems Negative for chest pain shortness of breath  no UTI symptoms. She is taking iron twice a day. She works night shifts seems to sleep okay during the day. No major changes in vision and hearing she does wear glasses rest of ROS negative or noncontributory. No history of abnormal Pap smears. She is menopausal.   has occasional headaches and has taken Tylenol doesn't work as well as Advil Aleve. No current heartburn nausea vomiting.  Past Medical History  Diagnosis Date  . Allergy   . Hyperlipidemia   . Anemia   . Sickle cell trait   . History of varicella   . Gastritis     EGD neg bx 2011   . Hiatal hernia     small on egd   History reviewed. No pertinent past surgical history.  reports that she has quit smoking. She does not have any smokeless tobacco history on file. She reports that she drinks alcohol. She reports that she does not use illicit drugs. family history includes Hypertension in an unspecified family member.  She is adopted. Allergies  Allergen Reactions  . Penicillins     Objective:   Physical Exam Physical Exam: Vital signs reviewed XLK:GMWN is a well-developed well-nourished alert cooperative A A female who appears her stated age in no acute distress.  HEENT: normocephalic  traumatic , Eyes: PERRL EOM's full, conjunctiva clear, Nares: paten,t no deformity discharge or tenderness., Ears: no deformity EAC's clear TMs  with normal landmarks. Mouth: clear OP, no lesions, edema.  Moist mucous membranes. Dentition in adequate repair. NECK: supple without masses, thyromegaly or bruits. CHEST/PULM:  Clear to auscultation and percussion breath sounds equal no wheeze , rales or rhonchi. No chest wall deformities or tenderness. Breast: normal by inspection . No dimpling, discharge, masses, tenderness or discharge . LN: no cervical axillary inguinal adenopathy  CV: PMI is nondisplaced, S1 S2 no gallops, murmurs, rubs. Peripheral pulses are full without delay.No JVD .  ABDOMEN: Bowel sounds normal nontender  No guard or rebound, no hepato splenomegal no CVA tenderness.  No hernia. Pelvic: NL ext GU, labia clear without lesions or rash . Vagina no lesions .Cervix: clear  UTERUS: Neg CMT Adnexa:  clear no masses . PAP done Rectal no mass heme negative  Extremtities:  No clubbing cyanosis or edema, no acute joint swelling or redness no focal atrophy NEURO:  Oriented x3, cranial nerves 3-12 appear to be intact, no obvious focal weakness,gait within normal limits no abnormal reflexes or asymmetrical SKIN: No acute rashes normal turgor, color, no bruising or petechiae. PSYCH: Oriented, good eye contact, no obvious depression anxiety, cognition and judgment appear normal.  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 Gastritis HH small  Disc  Results of endo . She has no sx currently. Anemia better  Iron defic eval done  If has to take nsaid take with prilosec.    Allergic rhinitis    Refill meds

## 2010-05-02 ENCOUNTER — Encounter: Payer: Self-pay | Admitting: *Deleted

## 2010-05-31 ENCOUNTER — Encounter: Payer: Self-pay | Admitting: Internal Medicine

## 2010-05-31 ENCOUNTER — Ambulatory Visit (INDEPENDENT_AMBULATORY_CARE_PROVIDER_SITE_OTHER): Payer: PRIVATE HEALTH INSURANCE | Admitting: Internal Medicine

## 2010-05-31 VITALS — BP 110/70 | HR 72 | Temp 98.1°F | Wt 186.0 lb

## 2010-05-31 DIAGNOSIS — R300 Dysuria: Secondary | ICD-10-CM

## 2010-05-31 DIAGNOSIS — R509 Fever, unspecified: Secondary | ICD-10-CM

## 2010-05-31 DIAGNOSIS — D573 Sickle-cell trait: Secondary | ICD-10-CM

## 2010-05-31 DIAGNOSIS — N12 Tubulo-interstitial nephritis, not specified as acute or chronic: Secondary | ICD-10-CM

## 2010-05-31 LAB — POCT URINALYSIS DIPSTICK
Glucose, UA: NEGATIVE
Spec Grav, UA: 1.02

## 2010-05-31 MED ORDER — CIPROFLOXACIN HCL 500 MG PO TABS: 500.0000 mg | ORAL_TABLET | Freq: Two times a day (BID) | ORAL | Status: AC

## 2010-05-31 NOTE — Patient Instructions (Signed)
I think you may have a kidney infection Take antibiotic and call if fever chills not gone by Thursday of Friday. Can go back t work on Monday. Avoid sodas caffeine for now .Ok to take tylenol if needed.

## 2010-05-31 NOTE — Progress Notes (Signed)
  Subjective:    Patient ID: Cathy Meyers, female    DOB: 07-01-1945, 65 y.o.   MRN: [id number]  HPI Patient comes in today For acute visit.  She had onset  about two days ago of low back And  body aches and then  Developed Chills fever of over 101 ; took tylenol  .   Had chill last pm and then burning urination.   She denies any significant nausea vomiting costs headache unusual rashes tick bite or joint swelling.. Pattern is persistent and contiuous. Works in [employer] but no exposures. Last Kidney infection.   About 10 Years ago.   Review of Systems No NVD or cough.  No hematuria no significant abdominal pain. No change in bowel habits.     Objective:   Physical Exam Well developed well-nourished in no acute distress looks mildly ill HEENT: Normocephalic ;atraumatic , Eyes;  PERRL, EOMs  Full, lids and conjunctiva clear,,Ears: no deformities, canals nl, TM landmarks normal, Nose: no deformity or discharge  Mouth : OP clear without lesion or edema . Neck SUpple without adenopathy Chest:  Clear to A&P without wheezes rales or rhonchi CV:  S1-S2 no gallops or murmurs peripheral perfusion is normal Abdomen:  Sof,t normal bowel sounds without hepatosplenomegaly, no guarding rebound or masses no CVA tenderness But does have some low back pain bilaterally no point tenderness MS no acute joint swelling redness or limitation Neuro: nonfocal oriented time person place gait within normal limits Skin: normal capillary refill no acute rashes or petechiae UA with leukocytes and blood      Assessment & Plan:  Fever chills back pain most likely pyelonephritis. No history of complications or obstruction or kidney stones.  Discussion about treatment plan will do culture begin on antibiotics ensure hydration and rest and then plan follow-up depending on clinical status and results .

## 2010-06-03 ENCOUNTER — Telehealth: Payer: Self-pay | Admitting: Internal Medicine

## 2010-06-03 LAB — URINE CULTURE

## 2010-06-03 NOTE — Telephone Encounter (Signed)
Pt aware of results 

## 2010-06-03 NOTE — Telephone Encounter (Signed)
Her urinary tract infection is Escherichia coli and Cipro should kill the germs. I want her to continue on the medication through the weekend. Schedule followup appointment in the middle of next week. Call if worse in the meantime.

## 2010-06-03 NOTE — Telephone Encounter (Signed)
Pt needs urine results and pt is still having back pain she is somewhat better. Pt did not have a fever last night however she had a fever ?101.3 on wed night.

## 2010-06-04 ENCOUNTER — Encounter: Payer: Self-pay | Admitting: Internal Medicine

## 2010-06-04 DIAGNOSIS — D573 Sickle-cell trait: Secondary | ICD-10-CM | POA: Insufficient documentation

## 2010-06-04 DIAGNOSIS — N12 Tubulo-interstitial nephritis, not specified as acute or chronic: Secondary | ICD-10-CM | POA: Insufficient documentation

## 2010-06-04 HISTORY — DX: Tubulo-interstitial nephritis, not specified as acute or chronic: N12

## 2010-06-07 ENCOUNTER — Telehealth: Payer: Self-pay | Admitting: *Deleted

## 2010-06-07 DIAGNOSIS — R300 Dysuria: Secondary | ICD-10-CM

## 2010-06-07 NOTE — Telephone Encounter (Signed)
Per Dr. Fabian Sharp- If okay have pt come back in for a repeat ua in 2-3 days after being off the antibiotic.  Spoke to pt and she states that she is doing better. She will call back to make a ua appt. Order placed in EPIC.

## 2010-06-10 NOTE — Assessment & Plan Note (Signed)
[facility]                            [facility] OFFICE NOTE   NAME:HENRYRenea Meyers                         MRN:          [id number]  DATE:12/05/2005                            DOB:          January 10, 1946    CHIEF COMPLAINT:  New patient to get established.   HISTORY OF PRESENT ILLNESS:  [name] is a 65 year old nonsmoking,  divorced African American female who comes in today for a first time  visit.  Previous care was through [facility]_ [facility] and previously [facility] 4-5 years ago.  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  She has had some  problems with migraine headaches and allergies with rhinorrhea and  stuffy nose since she moved from [facility] a few years ago.  At one  point, she was on a sample of Nasacort AQ which was significantly  helpful but has had to be on antibiotics for sinus headaches.  No recent  flare.  At one point she had gone to the emergency room and had had  vomiting with one of these episodes.  She does have a history of  headaches in the remote past.   PAST MEDICAL HISTORY:  See database.  Chickenpox as a child.   PAST SURGICAL HISTORY:  None.   Gravida 3, para 3.  Last Pap September 2006.  Menopausal.  Mammogram  September 2006.  Tetanus shot 2006.  Has not had a colonoscopy, was  planning on it but for various reasons did not go through.  Dexascan was  in 2005.   MEDICATIONS:  None.   ALLERGIES:  PENICILLIN.   FAMILY HISTORY:  Mom died of complications of CHF and had hypertension.  She does not know her father, does not know that history.  No family  history of diabetes that she knows of.   SOCIAL HISTORY:  Works as a Lawyer, divorced.  Rare to occasional alcohol.  No tobacco.  Tries to exercise.  Eight hours of sleep when she is at  home.  Lives with her daughter and herself.  No pets.  For the rest of  social history, see data base.   REVIEW OF  SYSTEMS:  Negative for chest pain, shortness of breath.  She  is concerned about her weight and is trying to lose six pounds already  just by cutting back.  MUSCULOSKELETAL:  Noncontributory.  Rest of  review of systems negative.   OBJECTIVE:  GENERAL APPEARANCE:  Well-developed, well-nourished, healthy-  appearing, middle-aged lady in no acute distress.  VITAL SIGNS:  Height 5 feet 3-1/2, weight 181 pounds, pulse 60 and  regular, blood pressure 130/82.  HEENT:  Grossly normal.  NECK:  Supple without masses.  Thyroid is easily palpable with no  obvious nodules.  CHEST:  Clear to auscultation and percussion, breath sounds equal.  CARDIOVASCULAR:  S1 and S2, no gallops or murmurs.  Peripheral pulses  present without delay.  ABDOMEN:  Soft without organomegaly, guarding or rebound.  NEUROLOGIC:  Grossly intact.  IMPRESSION:  1. History of seasonal rhinitis and secondary headaches which sound      like migraine as opposed to sinus headaches.  Will try Nasacort      Aqua samples two sprays each nares every day plus prescription for      same and will follow up if having recurrent problems there.  2. Family history of congestive heart failure.  3. Shift work, although appears to be doing well.  4. Slightly elevated body mass index.  Will schedule CPX, lab and 30-      minute check-up as needed.  I did discuss dietary lifestyle      approach.  Suggest joining Weight Watcher or something similar to      such in regard to healthy lifestyles and weight control.     Neta Mends. Panosh, MD  Electronically Signed    WKP/MedQ  DD: 12/05/2005  DT: 12/06/2005  Job #: [id number]

## 2010-06-13 ENCOUNTER — Encounter: Payer: Self-pay | Admitting: Internal Medicine

## 2010-06-13 ENCOUNTER — Ambulatory Visit (INDEPENDENT_AMBULATORY_CARE_PROVIDER_SITE_OTHER): Payer: PRIVATE HEALTH INSURANCE | Admitting: Internal Medicine

## 2010-06-13 VITALS — BP 100/62 | HR 78 | Wt 188.0 lb

## 2010-06-13 DIAGNOSIS — R8290 Unspecified abnormal findings in urine: Secondary | ICD-10-CM

## 2010-06-13 DIAGNOSIS — N12 Tubulo-interstitial nephritis, not specified as acute or chronic: Secondary | ICD-10-CM

## 2010-06-13 DIAGNOSIS — R82998 Other abnormal findings in urine: Secondary | ICD-10-CM

## 2010-06-13 LAB — POCT URINALYSIS DIPSTICK
Bilirubin, UA: NEGATIVE
Blood, UA: NEGATIVE
Glucose, UA: NEGATIVE
Nitrite, UA: NEGATIVE
Urobilinogen, UA: 0.2

## 2010-06-13 NOTE — Patient Instructions (Signed)
Avoid as much caffiene and carbonation as you can for reasons discussed. If  Any bladder sx of back pain comes back call early for [place] to recheck you for infection. You had a kidney infection  that caused fever and back pain.

## 2010-06-13 NOTE — Progress Notes (Signed)
  Subjective:    Patient ID: Cathy Meyers, female    DOB: 1945-11-02, 65 y.o.   MRN: [id number]  HPI Patient comes in today for followup as directed after being treated for pyelonephritis on the right. She has completed her 10 days of Cipro as of 4 days ago. She states she feels back to baseline with no back pain fever chills nausea vomiting or fatigue.    She has backed off on the caffeine and carbonation in the meantime. Her last history of a significant UTI with fever and kidney infection was in 2002 or thereabouts. She does not have frequent UTIs otherwise. She does have questions about the diagnosis and prevention.   Review of Systems Negative for chest pain shortness of breath current flank pain radiating back pain leg pain or dysuria.    Objective:   Physical Exam Well-developed well-nourished in no acute distress looks well today. Abdomen:  Sof,t normal bowel sounds without hepatosplenomegaly, no guarding rebound or masses no CVA tenderness Chest:  Clear to A&P without wheezes rales or rhonchi CV:  S1-S2 no gallops or murmurs peripheral perfusion is normal UA 1+ leuk on dip rest negative .        Assessment & Plan:  S/p Pyleonephritis    E coli  pansensitive    1+ leuk prob insig and feels  Well and no relapsing sx.  Counseled. And disc about above  .    Call with returning sx early  If needed.   Otherwise prn or when due for reg check  Total visit > 50% spent counseling and coordinating care

## 2010-10-25 ENCOUNTER — Other Ambulatory Visit (INDEPENDENT_AMBULATORY_CARE_PROVIDER_SITE_OTHER): Payer: PRIVATE HEALTH INSURANCE

## 2010-10-25 DIAGNOSIS — D649 Anemia, unspecified: Secondary | ICD-10-CM

## 2010-10-25 LAB — CBC WITH DIFFERENTIAL/PLATELET
Basophils Relative: 0.7 % (ref 0.0–3.0)
Eosinophils Relative: 1.9 % (ref 0.0–5.0)
Hemoglobin: 11.7 g/dL — ABNORMAL LOW (ref 12.0–15.0)
Lymphocytes Relative: 29.1 % (ref 12.0–46.0)
MCHC: 33.1 g/dL (ref 30.0–36.0)
Monocytes Relative: 8.6 % (ref 3.0–12.0)
Neutro Abs: 3.7 10*3/uL (ref 1.4–7.7)
Neutrophils Relative %: 59.7 % (ref 43.0–77.0)
RBC: 3.8 Mil/uL — ABNORMAL LOW (ref 3.87–5.11)
WBC: 6.2 10*3/uL (ref 4.5–10.5)

## 2010-10-25 LAB — IBC PANEL
Saturation Ratios: 20.1 % (ref 20.0–50.0)
Transferrin: 242.1 mg/dL (ref 212.0–360.0)

## 2010-11-01 ENCOUNTER — Ambulatory Visit (INDEPENDENT_AMBULATORY_CARE_PROVIDER_SITE_OTHER): Payer: PRIVATE HEALTH INSURANCE | Admitting: Internal Medicine

## 2010-11-01 ENCOUNTER — Encounter: Payer: Self-pay | Admitting: Internal Medicine

## 2010-11-01 VITALS — BP 120/80 | HR 72 | Wt 196.0 lb

## 2010-11-01 DIAGNOSIS — D649 Anemia, unspecified: Secondary | ICD-10-CM

## 2010-11-01 DIAGNOSIS — M25559 Pain in unspecified hip: Secondary | ICD-10-CM

## 2010-11-01 DIAGNOSIS — E669 Obesity, unspecified: Secondary | ICD-10-CM

## 2010-11-01 DIAGNOSIS — D509 Iron deficiency anemia, unspecified: Secondary | ICD-10-CM

## 2010-11-01 DIAGNOSIS — D573 Sickle-cell trait: Secondary | ICD-10-CM

## 2010-11-01 NOTE — Progress Notes (Signed)
  Subjective:    Patient ID: Cathy Meyers, female    DOB: [birth year], 65 y.o.   MRN: [id number]  HPI Pt comes in today  fo fu of anemia. And other medical concerns Since last visit no bleeding. Taking iron some GI on prilosec now with some continuing MS pain Want to lose weight asks advice.  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  Taking  ocass ha also No fever visioin change vomiting bleeding  Falls  Past Medical History  Diagnosis Date  . Allergy   . Hyperlipidemia   . Anemia   . Sickle cell trait   . History of varicella   . Gastritis     EGD neg bx 2011   . Hiatal hernia     small on egd  . Hx: UTI (urinary tract infection)     pyelo   No past surgical history on file.  reports that she has quit smoking. She does not have any smokeless tobacco history on file. She reports that she drinks alcohol. She reports that she does not use illicit drugs. family history includes Heart failure in her mother and Hypertension in her mother.  She is adopted. Allergies  Allergen Reactions  . Penicillins        Objective:   Physical Exam WDWN in nad Neck: Supple without adenopathy or masses or bruits Chest:  Clear to A&P without wheezes rales or rhonchi CV:  S1-S2 no gallops or murmurs peripheral perfusion is normal Abdomen:  Sof,t normal bowel sounds without hepatosplenomegaly, no guarding rebound or masses no CVA tenderness Skin: normal capillary refill ,turgor , color: No acute rashes ,petechiae or bruising Lab Results  Component Value Date   WBC 6.2 10/25/2010   HGB 11.7* 10/25/2010   HCT 35.3* 10/25/2010   PLT 266.0 10/25/2010   GLUCOSE 75 04/08/2010   CHOL 205* 04/08/2010   TRIG 67.0 04/08/2010   HDL 72.60 04/08/2010   LDLDIRECT 120.2 04/08/2010   LDLCALC 109* 04/05/2009   ALT 18 04/08/2010   AST 25 04/08/2010   NA 145 04/08/2010   K 4.7 04/08/2010   CL 109 04/08/2010   CREATININE 0.8 04/08/2010   BUN 15 04/08/2010   CO2 29 04/08/2010   TSH  2.74 04/08/2010        Assessment & Plan:  Anemia  About the same  Hx of trait not sig .  MS hip pain  Need gi protection with  Taking nsaid HH  Hx of gastritis. Weight concerns Counseled.   Total visit > 50% spent counseling and coordinating care

## 2010-11-01 NOTE — Patient Instructions (Signed)
At this point stay on the iron.  Suggest weight watchers . Take prilosec  When you take ibuprofen   For headache or hip pain.  Check up and labs in 6 months.  3500 calories is the energy content of a pound of body weight .Must have a 3500 cal deficit to lose one pound . Thus decrease 500 calorie equivalent per day in food or drink intake / or exercise  for 7 days to lose one pound.

## 2010-11-14 ENCOUNTER — Telehealth: Payer: Self-pay | Admitting: Internal Medicine

## 2010-11-14 MED ORDER — OMEPRAZOLE 20 MG PO CPDR: DELAYED_RELEASE_CAPSULE | ORAL | Status: DC

## 2010-11-14 NOTE — Telephone Encounter (Signed)
rx sent into pharmacy

## 2010-11-14 NOTE — Telephone Encounter (Signed)
Pt requesting refill on    omeprazole (PRILOSEC) 20 MG capsule   [facility] [address]

## 2010-11-27 DIAGNOSIS — E669 Obesity, unspecified: Secondary | ICD-10-CM | POA: Insufficient documentation

## 2010-11-27 DIAGNOSIS — M25559 Pain in unspecified hip: Secondary | ICD-10-CM | POA: Insufficient documentation

## 2011-04-25 ENCOUNTER — Other Ambulatory Visit (INDEPENDENT_AMBULATORY_CARE_PROVIDER_SITE_OTHER): Payer: PRIVATE HEALTH INSURANCE

## 2011-04-25 DIAGNOSIS — Z0000 Encounter for general adult medical examination without abnormal findings: Secondary | ICD-10-CM

## 2011-04-25 LAB — POCT URINALYSIS DIPSTICK
Bilirubin, UA: NEGATIVE
Blood, UA: NEGATIVE
Glucose, UA: NEGATIVE
Ketones, UA: NEGATIVE
Nitrite, UA: NEGATIVE

## 2011-04-25 LAB — LIPID PANEL
HDL: 77.9 mg/dL (ref >39.00)
Triglycerides: 82 mg/dL (ref 0.0–149.0)
VLDL: 16.4 mg/dL (ref 0.0–40.0)

## 2011-04-25 LAB — CBC WITH DIFFERENTIAL/PLATELET
Basophils Relative: 0.6 % (ref 0.0–3.0)
Eosinophils Absolute: 0.4 10*3/uL (ref 0.0–0.7)
Eosinophils Relative: 4.8 % (ref 0.0–5.0)
Hemoglobin: 12.1 g/dL (ref 12.0–15.0)
Lymphocytes Relative: 25 % (ref 12.0–46.0)
MCHC: 33 g/dL (ref 30.0–36.0)
MCV: 93.2 fl (ref 78.0–100.0)
Monocytes Absolute: 0.6 10*3/uL (ref 0.1–1.0)
Neutro Abs: 4.5 10*3/uL (ref 1.4–7.7)
Neutrophils Relative %: 60.9 % (ref 43.0–77.0)
RBC: 3.92 Mil/uL (ref 3.87–5.11)
WBC: 7.4 10*3/uL (ref 4.5–10.5)

## 2011-04-25 LAB — BASIC METABOLIC PANEL
CO2: 27 mEq/L (ref 19–32)
Chloride: 107 mEq/L (ref 96–112)
Creatinine, Ser: 0.8 mg/dL (ref 0.4–1.2)
Potassium: 4.3 mEq/L (ref 3.5–5.1)
Sodium: 144 mEq/L (ref 135–145)

## 2011-04-25 LAB — HEPATIC FUNCTION PANEL
ALT: 11 U/L (ref 0–35)
AST: 18 U/L (ref 0–37)
Albumin: 4 g/dL (ref 3.5–5.2)
Alkaline Phosphatase: 93 U/L (ref 39–117)

## 2011-04-25 LAB — TSH: TSH: 2.07 u[IU]/mL (ref 0.35–5.50)

## 2011-05-02 ENCOUNTER — Encounter: Payer: Self-pay | Admitting: Internal Medicine

## 2011-05-02 ENCOUNTER — Ambulatory Visit (INDEPENDENT_AMBULATORY_CARE_PROVIDER_SITE_OTHER): Payer: PRIVATE HEALTH INSURANCE | Admitting: Internal Medicine

## 2011-05-02 VITALS — BP 108/70 | HR 78 | Temp 98.9°F | Ht 63.0 in | Wt 178.0 lb

## 2011-05-02 DIAGNOSIS — D509 Iron deficiency anemia, unspecified: Secondary | ICD-10-CM

## 2011-05-02 DIAGNOSIS — N941 Unspecified dyspareunia: Secondary | ICD-10-CM

## 2011-05-02 DIAGNOSIS — E611 Iron deficiency: Secondary | ICD-10-CM

## 2011-05-02 DIAGNOSIS — Z23 Encounter for immunization: Secondary | ICD-10-CM

## 2011-05-02 DIAGNOSIS — Z0000 Encounter for general adult medical examination without abnormal findings: Secondary | ICD-10-CM

## 2011-05-02 DIAGNOSIS — Z2911 Encounter for prophylactic immunotherapy for respiratory syncytial virus (RSV): Secondary | ICD-10-CM

## 2011-05-02 DIAGNOSIS — N952 Postmenopausal atrophic vaginitis: Secondary | ICD-10-CM

## 2011-05-02 DIAGNOSIS — K2970 Gastritis, unspecified, without bleeding: Secondary | ICD-10-CM

## 2011-05-02 MED ORDER — ESTROGENS, CONJUGATED 0.625 MG/GM VA CREA: TOPICAL_CREAM | Freq: Every day | VAGINAL | Status: DC

## 2011-05-02 NOTE — Progress Notes (Signed)
Subjective:    Patient ID: Cathy Meyers, female    DOB: 17-Dec-1945, 66 y.o.   MRN: [id number]  HPI Patient comes in today for preventive visit and follow-up of medical issues. Update  history since  last visit: Still taking iron ;no bleeding ;had eval per gi dx with gastritis  New concern:   Having vagina dryness some dyspareunia  with ic for months otcs not that helpful. no dc and no uti sx.   No injuries  New dc  Works shifts 4-6 hours    Review of Systems ROS:  GEN/ HEENT: No fever, significant weight changes sweats headaches vision problems hearing changes, CV/ PULM; No chest pain shortness of breath cough, syncope,edema  change in exercise tolerance. GI /GU: No adominal pain, vomiting, change in bowel habits. No blood in the stool. No significant GU symptoms. SKIN/HEME: ,no acute skin rashes suspicious lesions or bleeding. No lymphadenopathy, nodules, masses.  NEURO/ PSYCH:  No neurologic signs such as weakness numbness. No depression anxiety. IMM/ Allergy: No unusual infections.  Allergy .   REST of 12 system review negative except as per HPI Past history family history social history reviewed in the electronic medical record. Outpatient Prescriptions Prior to Visit  Medication Sig Dispense Refill  . Cholecalciferol (VITAMIN D) 1000 UNITS capsule Take 1,000 Units by mouth daily.        . ferrous sulfate 325 (65 FE) MG tablet Take 325 mg by mouth daily with breakfast.        . mometasone (NASONEX) 50 MCG/ACT nasal spray 2 sprays by Nasal route daily.  17 g  12  . Multiple Vitamin (MULTIVITAMIN) tablet Take 1 tablet by mouth daily.        Marland Kitchen omeprazole (PRILOSEC) 20 MG capsule As directed one a day  30 capsule  1        Objective:   Physical Exam  BP 108/70  Pulse 78  Temp(Src) 98.9 F (37.2 C) (Oral)  Ht 5\' 3"  (1.6 m)  Wt 178 lb (80.74 kg)  BMI 31.53 kg/m2  SpO2 98%  Physical Exam: Vital signs reviewed [id number] is a well-developed well-nourished alert cooperative   white female who appears her stated age in no acute distress.  HEENT: normocephalic atraumatic , Eyes: PERRL EOM's full, conjunctiva clear, Nares: paten,t no deformity discharge or tenderness., Ears: no deformity EAC's clear TMs with normal landmarks. Mouth: clear OP, no lesions, edema.  Moist mucous membranes. Dentition in adequate repair. NECK: supple without masses, thyromegaly or bruits. CHEST/PULM:  Clear to auscultation and percussion breath sounds equal no wheeze , rales or rhonchi. No chest wall deformities or tenderness. Breast: normal by inspection . No dimpling, discharge, masses, tenderness or discharge .  CV: PMI is nondisplaced, S1 S2 no gallops, murmurs, rubs. Peripheral pulses are full without delay.No JVD .  ABDOMEN: Bowel sounds normal nontender  No guard or rebound, no hepato splenomegal no CVA tenderness.  No hernia. Extremtities:  No clubbing cyanosis or edema, no acute joint swelling or redness no focal atrophy NEURO:  Oriented x3, cranial nerves 3-12 appear to be intact, no obvious focal weakness,gait within normal limits no abnormal reflexes or asymmetrical SKIN: No acute rashes normal turgor, color, no bruising or petechiae. PSYCH: Oriented, good eye contact, no obvious depression anxiety, cognition and judgment appear normal. LN: no cervical axillary inguinal adenopathy GU mild irritation no abnormality ureterocele minimal no masses on bimanual or dc       Lab Results  Component Value Date  WBC 7.4 04/25/2011   HGB 12.1 04/25/2011   HCT 36.6 04/25/2011   PLT 258.0 04/25/2011   GLUCOSE 77 04/25/2011   CHOL 202* 04/25/2011   TRIG 82.0 04/25/2011   HDL 77.90 04/25/2011   LDLDIRECT 122.4 04/25/2011   LDLCALC 109* 04/05/2009   ALT 11 04/25/2011   AST 18 04/25/2011   NA 144 04/25/2011   K 4.3 04/25/2011   CL 107 04/25/2011   CREATININE 0.8 04/25/2011   BUN 10 04/25/2011   CO2 27 04/25/2011   TSH 2.07 04/25/2011        Assessment & Plan:  Preventive Health Care Counseled regarding healthy  nutrition, exercise, sleep, injury prevention, calcium vit d and healthy weight . zostavax today Atrophic vaginitis   Disc      Risk benefit of medication discussed. Add estrogen creams.  Monitor sx .   Iron.  defic  Better     But still slightly low ibc  Cont iron for a few months and then dc   Allergy stable

## 2011-05-02 NOTE — Patient Instructions (Addendum)
Continue lifestyle intervention healthy eating and exercise . Attend to sleep needs . Your anemia is better but still low iron. Take the iron another month or so and then can stop.   Can begin estrogen cream as we discussed   Due for pap in 2015   Recheck in a year or as needed  For check up.   Atrophic Vaginitis Atrophic vaginitis is a problem of low levels of estrogen in women. This problem can happen at any age. It is most common in women who have gone through menopause ("the change").  HOW WILL I KNOW IF I HAVE THIS PROBLEM? You may have:  Trouble with peeing (urinating), such as:   Going to the bathroom often.   A hard time holding your pee until you reach a bathroom.   Leaking pee.   Having pain when you pee.   Itching or a burning feeling.   Vaginal bleeding and spotting.   Pain during sex.   Dryness of the vagina.   A yellow, bad-smelling fluid (discharge) coming from the vagina.  HOW WILL MY DOCTOR CHECK FOR THIS PROBLEM?  During your exam, your doctor will likely find the problem.   If there is a vaginal fluid, it may be checked for infection.  HOW WILL THIS PROBLEM BE TREATED? Keep the vulvar skin as clean as possible. Moisturizers and lubricants can help with some of the symptoms. Estrogen replacement can help. There are 2 ways to take estrogen:  Systemic estrogen gets estrogen to your whole body. It takes many weeks or months before the symptoms get better.   You take an estrogen pill.   You use a skin patch. This is a patch that you put on your skin.   If you still have your uterus, your doctor may ask you to take a hormone. Talk to your doctor about the right medicine for you.   Estrogen cream.  This puts estrogen only at the part of your body where you apply it. The cream is put into the vagina or put on the vulvar skin. For some women, estrogen cream works faster than pills or the patch.  CAN ALL WOMEN WITH THIS PROBLEM USE ESTROGEN? No. Women with  certain types of cancer, liver problems, or problems with blood clots should not take estrogen. Your doctor can help you decide the best treatment for your symptoms. Document Released: 06/28/2007 Document Revised: 12/29/2010 Document Reviewed: 06/28/2007 [facility] Patient Information 2012 [place], [place].

## 2011-05-07 ENCOUNTER — Encounter: Payer: Self-pay | Admitting: Internal Medicine

## 2011-05-11 ENCOUNTER — Other Ambulatory Visit: Payer: Self-pay | Admitting: Internal Medicine

## 2011-05-11 DIAGNOSIS — Z1231 Encounter for screening mammogram for malignant neoplasm of breast: Secondary | ICD-10-CM

## 2011-06-09 ENCOUNTER — Ambulatory Visit: Payer: PRIVATE HEALTH INSURANCE

## 2011-10-04 ENCOUNTER — Ambulatory Visit: Payer: PRIVATE HEALTH INSURANCE | Admitting: Internal Medicine

## 2011-11-21 ENCOUNTER — Ambulatory Visit (INDEPENDENT_AMBULATORY_CARE_PROVIDER_SITE_OTHER): Payer: Medicare Other | Admitting: Internal Medicine

## 2011-11-21 ENCOUNTER — Encounter: Payer: Self-pay | Admitting: Internal Medicine

## 2011-11-21 ENCOUNTER — Ambulatory Visit: Payer: PRIVATE HEALTH INSURANCE

## 2011-11-21 VITALS — BP 116/78 | HR 97 | Temp 98.5°F | Wt 181.0 lb

## 2011-11-21 DIAGNOSIS — Z7189 Other specified counseling: Secondary | ICD-10-CM

## 2011-11-21 DIAGNOSIS — B029 Zoster without complications: Secondary | ICD-10-CM

## 2011-11-21 DIAGNOSIS — Z23 Encounter for immunization: Secondary | ICD-10-CM

## 2011-11-21 MED ORDER — VALACYCLOVIR HCL 1 G PO TABS: 1000.0000 mg | ORAL_TABLET | Freq: Three times a day (TID) | ORAL | Status: DC

## 2011-11-21 NOTE — Progress Notes (Signed)
Chief Complaint  Patient presents with  . Rash    Itching and burning on her back.  Noticed the rash on Thursday.    HPI: Patient comes in today for an acute visit because of an itchy burning rash onset 4-5 days ago and is becoming more and more uncomfortable. and small bumps then began to burn over the weekend. Called yesterday. It got appointment for today.No fever  . No specific treatment has never had shingles but did have the vaccine. Has ? About risk and exposures to others Seems to be progressive and more sensitive on skin not to wear a bra.   ROS: See pertinent positives and negatives per HPI. No unusual fever respiratory problem chest pain joint swelling. New rashes  . Vision change  NVD.  Past Medical History  Diagnosis Date  . Allergy   . Hyperlipidemia   . Anemia   . Sickle cell trait   . History of varicella   . Gastritis     EGD neg bx 2011   . Hiatal hernia     small on egd  . Hx: UTI (urinary tract infection)     pyelo    Family History  Problem Relation Age of Onset  . Adopted: Yes  . Hypertension Mother     family hx  . Heart failure Mother     History   Social History  . Marital Status: Married    Spouse Name: N/A    Number of Children: N/A  . Years of Education: N/A   Social History Main Topics  . Smoking status: Former Games developer  . Smokeless tobacco: None  . Alcohol Use: Yes     socially  . Drug Use: No  . Sexually Active:    Other Topics Concern  . 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  , Disp: , Rfl: ;  conjugated estrogens (PREMARIN) vaginal cream, Place vaginally daily. 0. 5- 1 gram intravaginally for 2 week .then decrease to . 5 gm 2-3 x per week, Disp: 42.5 g, Rfl:  5;  mometasone (NASONEX) 50 MCG/ACT nasal spray, 2 sprays by Nasal route daily., Disp: 17 g, Rfl: 12 Multiple Vitamin (MULTIVITAMIN) tablet, Take 1 tablet by mouth daily.  , Disp: , Rfl: ;  omeprazole (PRILOSEC) 20 MG capsule, As directed one a day, Disp: 30 capsule, Rfl: 1;  valACYclovir (VALTREX) 1000 MG tablet, Take 1 tablet (1,000 mg total) by mouth 3 (three) times daily., Disp: 21 tablet, Rfl: 0  EXAM: BP 116/78  Pulse 97  Temp 98.5 F (36.9 C) (Oral)  Wt 181 lb (82.101 kg)  SpO2 98% GENERAL: vitals reviewed and listed above, alert, oriented, appears well hydrated and in no acute distress HEENT: atraumatic, conjunttiva clear, no obvious abnormalities on inspection of external nose and ears  NECK: no obvious masses on inspection palpation  LUNGS: nl respirations  SKIN: Linear vesicular crops left thorax lower dermatome with some redness underneath her left breast. No pustules noted. No significant adenopathy. Does not go past midline. CV: HRRR, no clubbing cyanosis or  peripheral edema nl cap refill  MS: moves all extremities without noticeable focal  abnormality  PSYCH: pleasant and cooperative, no obvious depression or anxiety  ASSESSMENT AND PLAN:  Discussed the following assessment and plan:  1. Shingles    Classic typical symptoms expectant management and treatment begin antiviral. Discussed pain control options; had zostavax   2. Need for prophylactic vaccination and inoculation against influenza   3. Counseling and coordination of care    disc about vaccine and risk benefit today .   pt asks for rx to be printed out .  Discussed flu vaccine at this time because it is not a live virus there is no contraindication to this. So given today  -Patient advised to return or notify a doctor immediately if symptoms worsen or persist or new concerns arise.  Patient Instructions  This is  Shingles  I agree Begin antiviral medication asap.  Can add lyrica  o neurontin for nerve  pain if becomes severe.  Can make our drowsi.  Shingles Shingles is caused by the same virus that causes chickenpox (varicella zoster virus or VZV). Shingles often occurs many years or decades after having chickenpox. That is why it is more common in adults older than 50 years. The virus reactivates and breaks out as an infection in a nerve root. SYMPTOMS   The initial feeling (sensations) may be pain. This pain is usually described as:  Burning.  Stabbing.  Throbbing.  Tingling in the nerve root.  A red rash will follow in a couple days. The rash may occur in any area of the body and is usually on one side (unilateral) of the body in a band or belt-like pattern. The rash usually starts out as very small blisters (vesicles). They will dry up after 7 to 10 days. This is not usually a significant problem except for the pain it causes.  Long-lasting (chronic) pain is more likely in an elderly person. It can last months to years. This condition is called postherpetic neuralgia. Shingles can be an extremely severe infection in someone with AIDS, a weakened immune system, or with forms of leukemia. It can also be severe if you are taking transplant medicines or other medicines that weaken the immune system. TREATMENT  Your caregiver will often treat you with:  Antiviral drugs.  Anti-inflammatory drugs.  Pain medicines. Bed rest is very important in preventing the pain associated with herpes zoster (postherpetic neuralgia). Application of heat in the form of a hot water bottle or electric heating pad or gentle pressure with the hand is recommended to help with the pain or discomfort. PREVENTION  A varicella zoster vaccine is available to help protect against the virus. The Food and Drug Administration approved the varicella zoster vaccine for individuals 76 years of age and older. HOME CARE INSTRUCTIONS   Cool compresses to the area of rash may be helpful.  Only take over-the-counter or  prescription medicines for pain, discomfort, or fever as directed by your caregiver.  Avoid contact with:  Babies.  Pregnant women.  Children with eczema.  Elderly people with transplants.  People with chronic illnesses, such as leukemia and AIDS.  If the area involved is on your face, you may receive a referral for follow-up to a specialist. It is very important to keep all follow-up appointments. This will help avoid eye complications, chronic pain, or disability. SEEK IMMEDIATE MEDICAL CARE IF:   You develop any pain (headache) in the area of the face or eye. This must be followed carefully by your caregiver or ophthalmologist. An infection in part of your eye (cornea) can be very serious. It could lead to blindness.  You do not have pain relief from prescribed medicines.  Your redness or swelling spreads.  The area involved becomes very swollen and painful.  You have a fever.  You notice any red or painful lines extending away from the affected area toward your heart (lymphangitis).  Your condition is worsening or has changed. Document Released: 01/09/2005 Document Revised: 04/03/2011 Document Reviewed: 12/14/2008 [facility] Patient Information 2013 [place], [place].      Lorretta Harp

## 2011-11-21 NOTE — Patient Instructions (Signed)
This is  Shingles  I agree Begin antiviral medication asap.  Can add lyrica  o neurontin for nerve pain if becomes severe.  Can make our drowsi.  Shingles Shingles is caused by the same virus that causes chickenpox (varicella zoster virus or VZV). Shingles often occurs many years or decades after having chickenpox. That is why it is more common in adults older than 50 years. The virus reactivates and breaks out as an infection in a nerve root. SYMPTOMS   The initial feeling (sensations) may be pain. This pain is usually described as:  Burning.  Stabbing.  Throbbing.  Tingling in the nerve root.  A red rash will follow in a couple days. The rash may occur in any area of the body and is usually on one side (unilateral) of the body in a band or belt-like pattern. The rash usually starts out as very small blisters (vesicles). They will dry up after 7 to 10 days. This is not usually a significant problem except for the pain it causes.  Long-lasting (chronic) pain is more likely in an elderly person. It can last months to years. This condition is called postherpetic neuralgia. Shingles can be an extremely severe infection in someone with AIDS, a weakened immune system, or with forms of leukemia. It can also be severe if you are taking transplant medicines or other medicines that weaken the immune system. TREATMENT  Your caregiver will often treat you with:  Antiviral drugs.  Anti-inflammatory drugs.  Pain medicines. Bed rest is very important in preventing the pain associated with herpes zoster (postherpetic neuralgia). Application of heat in the form of a hot water bottle or electric heating pad or gentle pressure with the hand is recommended to help with the pain or discomfort. PREVENTION  A varicella zoster vaccine is available to help protect against the virus. The Food and Drug Administration approved the varicella zoster vaccine for individuals 11 years of age and older. HOME CARE  INSTRUCTIONS   Cool compresses to the area of rash may be helpful.  Only take over-the-counter or prescription medicines for pain, discomfort, or fever as directed by your caregiver.  Avoid contact with:  Babies.  Pregnant women.  Children with eczema.  Elderly people with transplants.  People with chronic illnesses, such as leukemia and AIDS.  If the area involved is on your face, you may receive a referral for follow-up to a specialist. It is very important to keep all follow-up appointments. This will help avoid eye complications, chronic pain, or disability. SEEK IMMEDIATE MEDICAL CARE IF:   You develop any pain (headache) in the area of the face or eye. This must be followed carefully by your caregiver or ophthalmologist. An infection in part of your eye (cornea) can be very serious. It could lead to blindness.  You do not have pain relief from prescribed medicines.  Your redness or swelling spreads.  The area involved becomes very swollen and painful.  You have a fever.  You notice any red or painful lines extending away from the affected area toward your heart (lymphangitis).  Your condition is worsening or has changed. Document Released: 01/09/2005 Document Revised: 04/03/2011 Document Reviewed: 12/14/2008 [facility] Patient Information 2013 [place], [place].

## 2011-11-24 ENCOUNTER — Encounter: Payer: Self-pay | Admitting: Internal Medicine

## 2011-11-24 DIAGNOSIS — B029 Zoster without complications: Secondary | ICD-10-CM

## 2011-11-24 DIAGNOSIS — Z7189 Other specified counseling: Secondary | ICD-10-CM | POA: Insufficient documentation

## 2011-11-24 HISTORY — DX: Zoster without complications: B02.9

## 2011-11-29 ENCOUNTER — Other Ambulatory Visit: Payer: Self-pay | Admitting: Family Medicine

## 2011-11-29 ENCOUNTER — Telehealth: Payer: Self-pay | Admitting: Internal Medicine

## 2011-11-29 ENCOUNTER — Other Ambulatory Visit: Payer: Self-pay | Admitting: Internal Medicine

## 2011-11-29 MED ORDER — VALACYCLOVIR HCL 1 G PO TABS: 1000.0000 mg | ORAL_TABLET | Freq: Three times a day (TID) | ORAL | Status: DC

## 2011-11-29 NOTE — Telephone Encounter (Signed)
Pt called and is req refill of valACYclovir (VALTREX) 1000 MG tablet to [facility] on [address]. Pt still having some symptoms of shingles. Pt req to be notified when done.

## 2011-11-29 NOTE — Telephone Encounter (Signed)
Sent to the pharmacy by e-scribe. 

## 2011-11-30 ENCOUNTER — Other Ambulatory Visit: Payer: Self-pay | Admitting: Internal Medicine

## 2012-05-31 ENCOUNTER — Other Ambulatory Visit: Payer: Self-pay

## 2012-05-31 DIAGNOSIS — Z1231 Encounter for screening mammogram for malignant neoplasm of breast: Secondary | ICD-10-CM

## 2012-06-26 ENCOUNTER — Other Ambulatory Visit (INDEPENDENT_AMBULATORY_CARE_PROVIDER_SITE_OTHER): Payer: Medicare Other

## 2012-06-26 DIAGNOSIS — D573 Sickle-cell trait: Secondary | ICD-10-CM

## 2012-06-26 DIAGNOSIS — Z0000 Encounter for general adult medical examination without abnormal findings: Secondary | ICD-10-CM

## 2012-06-26 DIAGNOSIS — D649 Anemia, unspecified: Secondary | ICD-10-CM

## 2012-06-26 LAB — CBC WITH DIFFERENTIAL/PLATELET
Basophils Relative: 0.4 % (ref 0.0–3.0)
Eosinophils Absolute: 0.1 10*3/uL (ref 0.0–0.7)
MCHC: 33.7 g/dL (ref 30.0–36.0)
MCV: 91.2 fl (ref 78.0–100.0)
Monocytes Absolute: 0.6 10*3/uL (ref 0.1–1.0)
Neutro Abs: 4 10*3/uL (ref 1.4–7.7)
Neutrophils Relative %: 62.5 % (ref 43.0–77.0)
RBC: 3.97 Mil/uL (ref 3.87–5.11)

## 2012-06-26 LAB — BASIC METABOLIC PANEL
BUN: 10 mg/dL (ref 6–23)
CO2: 25 mEq/L (ref 19–32)
Chloride: 111 mEq/L (ref 96–112)
Creatinine, Ser: 0.8 mg/dL (ref 0.4–1.2)

## 2012-06-26 LAB — LIPID PANEL
Cholesterol: 187 mg/dL (ref 0–200)
Triglycerides: 54 mg/dL (ref 0.0–149.0)

## 2012-06-26 LAB — TSH: TSH: 2.74 u[IU]/mL (ref 0.35–5.50)

## 2012-06-26 LAB — HEPATIC FUNCTION PANEL
Albumin: 3.5 g/dL (ref 3.5–5.2)
Bilirubin, Direct: 0.1 mg/dL (ref 0.0–0.3)
Total Protein: 7.2 g/dL (ref 6.0–8.3)

## 2012-07-03 ENCOUNTER — Encounter: Payer: Medicare Other | Admitting: Internal Medicine

## 2012-07-22 ENCOUNTER — Ambulatory Visit
Admission: RE | Admit: 2012-07-22 | Discharge: 2012-07-22 | Disposition: A | Discharge status: Home / Self Care | Payer: Medicare Other | Source: Ambulatory Visit

## 2012-07-22 ENCOUNTER — Ambulatory Visit: Payer: Medicare Other

## 2012-07-22 DIAGNOSIS — Z1231 Encounter for screening mammogram for malignant neoplasm of breast: Secondary | ICD-10-CM

## 2012-07-25 ENCOUNTER — Other Ambulatory Visit: Payer: Self-pay | Admitting: Internal Medicine

## 2012-07-25 DIAGNOSIS — R928 Other abnormal and inconclusive findings on diagnostic imaging of breast: Secondary | ICD-10-CM

## 2012-08-09 ENCOUNTER — Ambulatory Visit
Admission: RE | Admit: 2012-08-09 | Discharge: 2012-08-09 | Disposition: A | Discharge status: Home / Self Care | Payer: Medicare Other | Source: Ambulatory Visit | Attending: Internal Medicine | Admitting: Internal Medicine

## 2012-08-09 DIAGNOSIS — R928 Other abnormal and inconclusive findings on diagnostic imaging of breast: Secondary | ICD-10-CM

## 2012-08-14 ENCOUNTER — Ambulatory Visit (INDEPENDENT_AMBULATORY_CARE_PROVIDER_SITE_OTHER): Payer: Medicare Other | Admitting: Internal Medicine

## 2012-08-14 ENCOUNTER — Encounter: Payer: Self-pay | Admitting: Internal Medicine

## 2012-08-14 VITALS — BP 112/80 | HR 64 | Temp 97.8°F | Ht 63.5 in | Wt 186.0 lb

## 2012-08-14 DIAGNOSIS — Z0000 Encounter for general adult medical examination without abnormal findings: Secondary | ICD-10-CM

## 2012-08-14 DIAGNOSIS — M25569 Pain in unspecified knee: Secondary | ICD-10-CM

## 2012-08-14 DIAGNOSIS — Z23 Encounter for immunization: Secondary | ICD-10-CM

## 2012-08-14 DIAGNOSIS — J309 Allergic rhinitis, unspecified: Secondary | ICD-10-CM

## 2012-08-14 DIAGNOSIS — M25561 Pain in right knee: Secondary | ICD-10-CM

## 2012-08-14 DIAGNOSIS — E2839 Other primary ovarian failure: Secondary | ICD-10-CM

## 2012-08-14 MED ORDER — OMEPRAZOLE 20 MG PO CPDR: DELAYED_RELEASE_CAPSULE | ORAL | Status: DC

## 2012-08-14 MED ORDER — MOMETASONE FUROATE 50 MCG/ACT NA SUSP: NASAL | Status: DC

## 2012-08-14 NOTE — Progress Notes (Signed)
Chief Complaint  Patient presents with  . Medicare Wellness    HPI: Patient comes in today for Preventive Medicare wellness visit . No major injuries, ed visits ,hospitalizations , new medications since last visit. Exercise 2-3 x per week.    Allergy  Ok nasonsex helps.   ocass burning   For ha  prilosec   With HA  Pain meds     Hearing:  Ok   Vision:  No limitations at present . Last eye check UTD glasses  Has cataracts  ? Removal in the next few years.   Safety:  Has smoke detector and wears seat belts.  No firearms. No excess sun exposure. Sees dentist regularly.  Falls:  no  Advance directive :  Reviewed  Has one.  Daughter .  Melina Modena?  Memory: Felt to be good  , no concern from her or her family.  Depression: No anhedonia unusual crying or depressive symptoms  Nutrition: Eats well balanced diet; adequate calcium and vitamin D. No swallowing chewing problems. Hard to lose weight.   Injury: no major injuries in the last six months.  Other healthcare providers:  Reviewed today .  Social:  Lives with spouse married.    Preventive parameters: up-to-date  Reviewed   ADLS:   There are no problems or need for assistance  driving, feeding, obtaining food, dressing, toileting and bathing, managing money using phone. She is independent.  EXERCISE/ HABITS  Per week  When can    No tobacco    no etoh ocass coffee     ROS:  GEN/ HEENT: No fever, significant weight changes sweats headaches vision problems hearing changes, CV/ PULM; No chest pain shortness of breath cough, syncope,edema  change in exercise tolerance. GI /GU: No adominal pain, vomiting, change in bowel habits. No blood in the stool. No significant GU symptoms. SKIN/HEME: ,no acute skin rashes suspicious lesions or bleeding. No lymphadenopathy, nodules, masses.  NEURO/ PSYCH:  No neurologic signs such as weakness numbness. No depression anxiety. IMM/ Allergy: No unusual infections.  Allergy .   REST of 12 system  review negative except as per HPI   Past Medical History  Diagnosis Date  . Allergy   . Hyperlipidemia   . Anemia   . Sickle cell trait   . History of varicella   . Gastritis     EGD neg bx 2011   . Hiatal hernia     small on egd  . Hx: UTI (urinary tract infection)     pyelo  . Pyelonephritis 06/04/2010    Family History  Problem Relation Age of Onset  . Adopted: Yes  . Hypertension Mother     family hx  . Heart failure Mother     History   Social History  . Marital Status: Married    Spouse Name: N/A    Number of Children: N/A  . Years of Education: N/A   Social History Main Topics  . Smoking status: Former Games developer  . Smokeless tobacco: None  . Alcohol Use: Yes     Comment: socially  . Drug Use: No  . Sexually Active:    Other Topics Concern  . 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  At age 28    4.5 - 6 hours of sleep     Outpatient Encounter Prescriptions as of 08/14/2012  Medication Sig Dispense Refill  . Cholecalciferol (VITAMIN D) 1000 UNITS capsule Take 1,000 Units by mouth daily.        Marland Kitchen conjugated estrogens (PREMARIN) vaginal cream Place vaginally daily. 0. 5- 1 gram intravaginally for 2 week .then decrease to . 5 gm 2-3 x per week  42.5 g  5  . mometasone (NASONEX) 50 MCG/ACT nasal spray 2 spray each nostril each day.  17 g  12  . Multiple Vitamin (MULTIVITAMIN) tablet Take 1 tablet by mouth daily.        Marland Kitchen omeprazole (PRILOSEC) 20 MG capsule As directed one a day  30 capsule  3  . valACYclovir (VALTREX) 1000 MG tablet TAKE 1 TABLET 3 TIMES A DAY  21 tablet  2  . [DISCONTINUED] mometasone (NASONEX) 50 MCG/ACT nasal spray 2 sprays by Nasal route daily.  17 g  12  . [DISCONTINUED] omeprazole (PRILOSEC) 20 MG capsule As directed one a day  30 capsule  1   No facility-administered  encounter medications on file as of 08/14/2012.    EXAM:  BP 112/80  Pulse 64  Temp(Src) 97.8 F (36.6 C) (Oral)  Ht 5' 3.5" (1.613 m)  Wt 186 lb (84.369 kg)  BMI 32.43 kg/m2  SpO2 98%  Body mass index is 32.43 kg/(m^2).  Physical Exam: Vital signs reviewed WUJ:WJXB is a well-developed well-nourished alert cooperative   who appears stated age in no acute distress.  HEENT: normocephalic atraumatic , Eyes: PERRL EOM's full, conjunctiva clear, Nares: paten,t no deformity discharge or tenderness., Ears: no deformity EAC's clear TMs with normal landmarks. Mouth: clear OP, no lesions, edema.  Moist mucous membranes. Dentition in adequate repair. NECK: supple without masses, thyromegaly or bruits. CHEST/PULM:  Clear to auscultation and percussion breath sounds equal no wheeze , rales or rhonchi. No chest wall deformities or tenderness. CV: PMI is nondisplaced, S1 S2 no gallops, murmurs, rubs. Peripheral pulses are full without delay.No JVD .  Breast: normal by inspection . No dimpling, discharge, masses, tenderness or discharge .  ABDOMEN: Bowel sounds normal nontender  No guard or rebound, no hepato splenomegal no CVA tenderness.  No hernia. Extremtities:  No clubbing cyanosis or edema, no acute joint swelling or redness no focal atrophy NEURO:  Oriented x3, cranial nerves 3-12 appear to be intact, no obvious focal weakness,gait within normal limits no abnormal reflexes or asymmetrical SKIN: No acute rashes normal turgor, color, no bruising or petechiae. PSYCH: Oriented, good eye contact, no obvious depression anxiety, cognition and judgment appear normal. LN: no cervical axillary inguinal adenopathy No noted deficits in memory, attention, and speech.   Lab Results  Component Value Date   WBC 6.4 06/26/2012   HGB 12.2 06/26/2012   HCT 36.2 06/26/2012   PLT 279.0 06/26/2012   GLUCOSE 89 06/26/2012   CHOL 187 06/26/2012   TRIG 54.0 06/26/2012   HDL 63.90 06/26/2012   LDLDIRECT 122.4 04/25/2011    LDLCALC 112* 06/26/2012   ALT 19 06/26/2012   AST 27 06/26/2012   NA 144 06/26/2012   K 4.4 06/26/2012   CL 111 06/26/2012   CREATININE 0.8 06/26/2012   BUN 10 06/26/2012   CO2 25 06/26/2012   TSH 2.74 06/26/2012    ASSESSMENT AND PLAN:  Discussed the following assessment and plan:  Medicare annual wellness visit, subsequent - Plan: Pneumococcal polysaccharide vaccine 23-valent greater than  or equal to 2yo subcutaneous/IM  Estrogen deficiency - Plan: DG Bone Density  Need for prophylactic vaccination against Streptococcus pneumoniae (pneumococcus) - Plan: Pneumococcal polysaccharide vaccine 23-valent greater than or equal to 2yo subcutaneous/IM  ALLERGIC RHINITIS  Pain in joint, lower leg, right Takes  prilosec  When takes HA med to protect from gastritis and gerd.  Takes nasosnex when nasal and sinus sx flare up.  Ok to   Refill. each of them  Patient Care Team: Madelin Headings, MD as PCP - General Meryl Dare, MD (Gastroenterology)  Patient Instructions  Continue lifestyle intervention healthy eating and exercise . Schedule dexa at [address]  [phone] Wellness exam in 1 year or as needed   Preventive Care for Adults, Female A healthy lifestyle and preventive care can promote health and wellness. Preventive health guidelines for women include the following key practices.  A routine yearly physical is a good way to check with your caregiver about your health and preventive screening. It is a chance to share any concerns and updates on your health, and to receive a thorough exam.  Visit your dentist for a routine exam and preventive care every 6 months. Brush your teeth twice a day and floss once a day. Good oral hygiene prevents tooth decay and gum disease.  The frequency of eye exams is based on your age, health, family medical history, use of contact lenses, and other factors. Follow your caregiver's recommendations for frequency of eye exams.  Eat a healthy diet. Foods like  vegetables, fruits, whole grains, low-fat dairy products, and lean protein foods contain the nutrients you need without too many calories. Decrease your intake of foods high in solid fats, added sugars, and salt. Eat the right amount of calories for you.Get information about a proper diet from your caregiver, if necessary.  Regular physical exercise is one of the most important things you can do for your health. Most adults should get at least 150 minutes of moderate-intensity exercise (any activity that increases your heart rate and causes you to sweat) each week. In addition, most adults need muscle-strengthening exercises on 2 or more days a week.  Maintain a healthy weight. The body mass index (BMI) is a screening tool to identify possible weight problems. It provides an estimate of body fat based on height and weight. Your caregiver can help determine your BMI, and can help you achieve or maintain a healthy weight.For adults 20 years and older:  A BMI below 18.5 is considered underweight.  A BMI of 18.5 to 24.9 is normal.  A BMI of 25 to 29.9 is considered overweight.  A BMI of 30 and above is considered obese.  Maintain normal blood lipids and cholesterol levels by exercising and minimizing your intake of saturated fat. Eat a balanced diet with plenty of fruit and vegetables. Blood tests for lipids and cholesterol should begin at age 11 and be repeated every 5 years. If your lipid or cholesterol levels are high, you are over 50, or you are at high risk for heart disease, you may need your cholesterol levels checked more frequently.Ongoing high lipid and cholesterol levels should be treated with medicines if diet and exercise are not effective.  If you smoke, find out from your caregiver how to quit. If you do not use tobacco, do not start.  If you are pregnant, do not drink alcohol. If you are breastfeeding, be very cautious about drinking alcohol. If you are not pregnant and choose to  drink alcohol, do not exceed 1 drink per day. One drink is considered to be 12 ounces (355 mL) of beer, 5 ounces (148 mL) of wine, or 1.5 ounces (44 mL) of liquor.  Avoid use of street drugs. Do not share needles with anyone. Ask for help if you need support or instructions about stopping the use of drugs.  High blood pressure causes heart disease and increases the risk of stroke. Your blood pressure should be checked at least every 1 to 2 years. Ongoing high blood pressure should be treated with medicines if weight loss and exercise are not effective.  If you are 36 to 66 years old, ask your caregiver if you should take aspirin to prevent strokes.  Diabetes screening involves taking a blood sample to check your fasting blood sugar level. This should be done once every 3 years, after age 46, if you are within normal weight and without risk factors for diabetes. Testing should be considered at a younger age or be carried out more frequently if you are overweight and have at least 1 risk factor for diabetes.  Breast cancer screening is essential preventive care for women. You should practice "breast self-awareness." This means understanding the normal appearance and feel of your breasts and may include breast self-examination. Any changes detected, no matter how small, should be reported to a caregiver. Women in their 14s and 30s should have a clinical breast exam (CBE) by a caregiver as part of a regular health exam every 1 to 3 years. After age 9, women should have a CBE every year. Starting at age 66, women should consider having a mammography (breast X-ray test) every year. Women who have a family history of breast cancer should talk to their caregiver about genetic screening. Women at a high risk of breast cancer should talk to their caregivers about having magnetic resonance imaging (MRI) and a mammography every year.  The Pap test is a screening test for cervical cancer. A Pap test can show cell  changes on the cervix that might become cervical cancer if left untreated. A Pap test is a procedure in which cells are obtained and examined from the lower end of the uterus (cervix).  Women should have a Pap test starting at age 71.  Between ages 32 and 65, Pap tests should be repeated every 2 years.  Beginning at age 70, you should have a Pap test every 3 years as long as the past 3 Pap tests have been normal.  Some women have medical problems that increase the chance of getting cervical cancer. Talk to your caregiver about these problems. It is especially important to talk to your caregiver if a new problem develops soon after your last Pap test. In these cases, your caregiver may recommend more frequent screening and Pap tests.  The above recommendations are the same for women who have or have not gotten the vaccine for human papillomavirus (HPV).  If you had a hysterectomy for a problem that was not cancer or a condition that could lead to cancer, then you no longer need Pap tests. Even if you no longer need a Pap test, a regular exam is a good idea to make sure no other problems are starting.  If you are between ages 35 and 17, and you have had normal Pap tests going back 10 years, you no longer need Pap tests. Even if you no longer need a Pap test, a regular exam is a good idea to make sure  no other problems are starting.  If you have had past treatment for cervical cancer or a condition that could lead to cancer, you need Pap tests and screening for cancer for at least 20 years after your treatment.  If Pap tests have been discontinued, risk factors (such as a new sexual partner) need to be reassessed to determine if screening should be resumed.  The HPV test is an additional test that may be used for cervical cancer screening. The HPV test looks for the virus that can cause the cell changes on the cervix. The cells collected during the Pap test can be tested for HPV. The HPV test could  be used to screen women aged 22 years and older, and should be used in women of any age who have unclear Pap test results. After the age of 35, women should have HPV testing at the same frequency as a Pap test.  Colorectal cancer can be detected and often prevented. Most routine colorectal cancer screening begins at the age of 37 and continues through age 52. However, your caregiver may recommend screening at an earlier age if you have risk factors for colon cancer. On a yearly basis, your caregiver may provide home test kits to check for hidden blood in the stool. Use of a small camera at the end of a tube, to directly examine the colon (sigmoidoscopy or colonoscopy), can detect the earliest forms of colorectal cancer. Talk to your caregiver about this at age 78, when routine screening begins. Direct examination of the colon should be repeated every 5 to 10 years through age 75, unless early forms of pre-cancerous polyps or small growths are found.  Hepatitis C blood testing is recommended for all people born from [birth year] through 1964 and any individual with known risks for hepatitis C.  Practice safe sex. Use condoms and avoid high-risk sexual practices to reduce the spread of sexually transmitted infections (STIs). STIs include gonorrhea, chlamydia, syphilis, trichomonas, herpes, HPV, and human immunodeficiency virus (HIV). Herpes, HIV, and HPV are viral illnesses that have no cure. They can result in disability, cancer, and death. Sexually active women aged 32 and younger should be checked for chlamydia. Older women with new or multiple partners should also be tested for chlamydia. Testing for other STIs is recommended if you are sexually active and at increased risk.  Osteoporosis is a disease in which the bones lose minerals and strength with aging. This can result in serious bone fractures. The risk of osteoporosis can be identified using a bone density scan. Women ages 3 and over and women at risk for  fractures or osteoporosis should discuss screening with their caregivers. Ask your caregiver whether you should take a calcium supplement or vitamin D to reduce the rate of osteoporosis.  Menopause can be associated with physical symptoms and risks. Hormone replacement therapy is available to decrease symptoms and risks. You should talk to your caregiver about whether hormone replacement therapy is right for you.  Use sunscreen with sun protection factor (SPF) of 30 or more. Apply sunscreen liberally and repeatedly throughout the day. You should seek shade when your shadow is shorter than you. Protect yourself by wearing long sleeves, pants, a wide-brimmed hat, and sunglasses year round, whenever you are outdoors.  Once a month, do a whole body skin exam, using a mirror to look at the skin on your back. Notify your caregiver of new moles, moles that have irregular borders, moles that are larger than a pencil eraser, or  moles that have changed in shape or color.  Stay current with required immunizations.  Influenza. You need a dose every fall (or winter). The composition of the flu vaccine changes each year, so being vaccinated once is not enough.  Pneumococcal polysaccharide. You need 1 to 2 doses if you smoke cigarettes or if you have certain chronic medical conditions. You need 1 dose at age 63 (or older) if you have never been vaccinated.  Tetanus, diphtheria, pertussis (Tdap, Td). Get 1 dose of Tdap vaccine if you are younger than age 31, are over 45 and have contact with an infant, are a Research scientist (physical sciences), are pregnant, or simply want to be protected from whooping cough. After that, you need a Td booster dose every 10 years. Consult your caregiver if you have not had at least 3 tetanus and diphtheria-containing shots sometime in your life or have a deep or dirty wound.  HPV. You need this vaccine if you are a woman age 30 or younger. The vaccine is given in 3 doses over 6 months.  Measles,  mumps, rubella (MMR). You need at least 1 dose of MMR if you were born in 1957 or later. You may also need a second dose.  Meningococcal. If you are age 69 to 34 and a first-year college student living in a residence hall, or have one of several medical conditions, you need to get vaccinated against meningococcal disease. You may also need additional booster doses.  Zoster (shingles). If you are age 22 or older, you should get this vaccine.  Varicella (chickenpox). If you have never had chickenpox or you were vaccinated but received only 1 dose, talk to your caregiver to find out if you need this vaccine.  Hepatitis A. You need this vaccine if you have a specific risk factor for hepatitis A virus infection or you simply wish to be protected from this disease. The vaccine is usually given as 2 doses, 6 to 18 months apart.  Hepatitis B. You need this vaccine if you have a specific risk factor for hepatitis B virus infection or you simply wish to be protected from this disease. The vaccine is given in 3 doses, usually over 6 months.     Neta Mends. Panosh M.D.  Health Maintenance  Topic Date Due  . Influenza Vaccine  09/23/2012  . Tetanus/tdap  01/23/2014  . Mammogram  08/10/2014  . Colonoscopy  04/10/2017  . Pneumococcal Polysaccharide Vaccine Age 65 And Over  Completed  . Zostavax  Completed   Health Maintenance Review

## 2012-08-14 NOTE — Patient Instructions (Signed)
Continue lifestyle intervention healthy eating and exercise . Schedule dexa at [address]  [phone] Wellness exam in 1 year or as needed   Preventive Care for Adults, Female A healthy lifestyle and preventive care can promote health and wellness. Preventive health guidelines for women include the following key practices.  A routine yearly physical is a good way to check with your caregiver about your health and preventive screening. It is a chance to share any concerns and updates on your health, and to receive a thorough exam.  Visit your dentist for a routine exam and preventive care every 6 months. Brush your teeth twice a day and floss once a day. Good oral hygiene prevents tooth decay and gum disease.  The frequency of eye exams is based on your age, health, family medical history, use of contact lenses, and other factors. Follow your caregiver's recommendations for frequency of eye exams.  Eat a healthy diet. Foods like vegetables, fruits, whole grains, low-fat dairy products, and lean protein foods contain the nutrients you need without too many calories. Decrease your intake of foods high in solid fats, added sugars, and salt. Eat the right amount of calories for you.Get information about a proper diet from your caregiver, if necessary.  Regular physical exercise is one of the most important things you can do for your health. Most adults should get at least 150 minutes of moderate-intensity exercise (any activity that increases your heart rate and causes you to sweat) each week. In addition, most adults need muscle-strengthening exercises on 2 or more days a week.  Maintain a healthy weight. The body mass index (BMI) is a screening tool to identify possible weight problems. It provides an estimate of body fat based on height and weight. Your caregiver can help determine your BMI, and can help you achieve or maintain a healthy weight.For adults 20 years and older:  A BMI below  18.5 is considered underweight.  A BMI of 18.5 to 24.9 is normal.  A BMI of 25 to 29.9 is considered overweight.  A BMI of 30 and above is considered obese.  Maintain normal blood lipids and cholesterol levels by exercising and minimizing your intake of saturated fat. Eat a balanced diet with plenty of fruit and vegetables. Blood tests for lipids and cholesterol should begin at age 15 and be repeated every 5 years. If your lipid or cholesterol levels are high, you are over 50, or you are at high risk for heart disease, you may need your cholesterol levels checked more frequently.Ongoing high lipid and cholesterol levels should be treated with medicines if diet and exercise are not effective.  If you smoke, find out from your caregiver how to quit. If you do not use tobacco, do not start.  If you are pregnant, do not drink alcohol. If you are breastfeeding, be very cautious about drinking alcohol. If you are not pregnant and choose to drink alcohol, do not exceed 1 drink per day. One drink is considered to be 12 ounces (355 mL) of beer, 5 ounces (148 mL) of wine, or 1.5 ounces (44 mL) of liquor.  Avoid use of street drugs. Do not share needles with anyone. Ask for help if you need support or instructions about stopping the use of drugs.  High blood pressure causes heart disease and increases the risk of stroke. Your blood pressure should be checked at least every 1 to 2 years. Ongoing high blood pressure should be treated with medicines if weight loss  and exercise are not effective.  If you are 38 to 66 years old, ask your caregiver if you should take aspirin to prevent strokes.  Diabetes screening involves taking a blood sample to check your fasting blood sugar level. This should be done once every 3 years, after age 58, if you are within normal weight and without risk factors for diabetes. Testing should be considered at a younger age or be carried out more frequently if you are overweight and  have at least 1 risk factor for diabetes.  Breast cancer screening is essential preventive care for women. You should practice "breast self-awareness." This means understanding the normal appearance and feel of your breasts and may include breast self-examination. Any changes detected, no matter how small, should be reported to a caregiver. Women in their 76s and 30s should have a clinical breast exam (CBE) by a caregiver as part of a regular health exam every 1 to 3 years. After age 38, women should have a CBE every year. Starting at age 52, women should consider having a mammography (breast X-ray test) every year. Women who have a family history of breast cancer should talk to their caregiver about genetic screening. Women at a high risk of breast cancer should talk to their caregivers about having magnetic resonance imaging (MRI) and a mammography every year.  The Pap test is a screening test for cervical cancer. A Pap test can show cell changes on the cervix that might become cervical cancer if left untreated. A Pap test is a procedure in which cells are obtained and examined from the lower end of the uterus (cervix).  Women should have a Pap test starting at age 84.  Between ages 97 and 44, Pap tests should be repeated every 2 years.  Beginning at age 105, you should have a Pap test every 3 years as long as the past 3 Pap tests have been normal.  Some women have medical problems that increase the chance of getting cervical cancer. Talk to your caregiver about these problems. It is especially important to talk to your caregiver if a new problem develops soon after your last Pap test. In these cases, your caregiver may recommend more frequent screening and Pap tests.  The above recommendations are the same for women who have or have not gotten the vaccine for human papillomavirus (HPV).  If you had a hysterectomy for a problem that was not cancer or a condition that could lead to cancer, then you  no longer need Pap tests. Even if you no longer need a Pap test, a regular exam is a good idea to make sure no other problems are starting.  If you are between ages 29 and 32, and you have had normal Pap tests going back 10 years, you no longer need Pap tests. Even if you no longer need a Pap test, a regular exam is a good idea to make sure no other problems are starting.  If you have had past treatment for cervical cancer or a condition that could lead to cancer, you need Pap tests and screening for cancer for at least 20 years after your treatment.  If Pap tests have been discontinued, risk factors (such as a new sexual partner) need to be reassessed to determine if screening should be resumed.  The HPV test is an additional test that may be used for cervical cancer screening. The HPV test looks for the virus that can cause the cell changes on the cervix. The  cells collected during the Pap test can be tested for HPV. The HPV test could be used to screen women aged 50 years and older, and should be used in women of any age who have unclear Pap test results. After the age of 52, women should have HPV testing at the same frequency as a Pap test.  Colorectal cancer can be detected and often prevented. Most routine colorectal cancer screening begins at the age of 42 and continues through age 20. However, your caregiver may recommend screening at an earlier age if you have risk factors for colon cancer. On a yearly basis, your caregiver may provide home test kits to check for hidden blood in the stool. Use of a small camera at the end of a tube, to directly examine the colon (sigmoidoscopy or colonoscopy), can detect the earliest forms of colorectal cancer. Talk to your caregiver about this at age 12, when routine screening begins. Direct examination of the colon should be repeated every 5 to 10 years through age 27, unless early forms of pre-cancerous polyps or small growths are found.  Hepatitis C blood  testing is recommended for all people born from [birth year] through 1964 and any individual with known risks for hepatitis C.  Practice safe sex. Use condoms and avoid high-risk sexual practices to reduce the spread of sexually transmitted infections (STIs). STIs include gonorrhea, chlamydia, syphilis, trichomonas, herpes, HPV, and human immunodeficiency virus (HIV). Herpes, HIV, and HPV are viral illnesses that have no cure. They can result in disability, cancer, and death. Sexually active women aged 85 and younger should be checked for chlamydia. Older women with new or multiple partners should also be tested for chlamydia. Testing for other STIs is recommended if you are sexually active and at increased risk.  Osteoporosis is a disease in which the bones lose minerals and strength with aging. This can result in serious bone fractures. The risk of osteoporosis can be identified using a bone density scan. Women ages 57 and over and women at risk for fractures or osteoporosis should discuss screening with their caregivers. Ask your caregiver whether you should take a calcium supplement or vitamin D to reduce the rate of osteoporosis.  Menopause can be associated with physical symptoms and risks. Hormone replacement therapy is available to decrease symptoms and risks. You should talk to your caregiver about whether hormone replacement therapy is right for you.  Use sunscreen with sun protection factor (SPF) of 30 or more. Apply sunscreen liberally and repeatedly throughout the day. You should seek shade when your shadow is shorter than you. Protect yourself by wearing long sleeves, pants, a wide-brimmed hat, and sunglasses year round, whenever you are outdoors.  Once a month, do a whole body skin exam, using a mirror to look at the skin on your back. Notify your caregiver of new moles, moles that have irregular borders, moles that are larger than a pencil eraser, or moles that have changed in shape or  color.  Stay current with required immunizations.  Influenza. You need a dose every fall (or winter). The composition of the flu vaccine changes each year, so being vaccinated once is not enough.  Pneumococcal polysaccharide. You need 1 to 2 doses if you smoke cigarettes or if you have certain chronic medical conditions. You need 1 dose at age 63 (or older) if you have never been vaccinated.  Tetanus, diphtheria, pertussis (Tdap, Td). Get 1 dose of Tdap vaccine if you are younger than age 56, are over 50  and have contact with an infant, are a Research scientist (physical sciences), are pregnant, or simply want to be protected from whooping cough. After that, you need a Td booster dose every 10 years. Consult your caregiver if you have not had at least 3 tetanus and diphtheria-containing shots sometime in your life or have a deep or dirty wound.  HPV. You need this vaccine if you are a woman age 38 or younger. The vaccine is given in 3 doses over 6 months.  Measles, mumps, rubella (MMR). You need at least 1 dose of MMR if you were born in 1957 or later. You may also need a second dose.  Meningococcal. If you are age 98 to 41 and a first-year college student living in a residence hall, or have one of several medical conditions, you need to get vaccinated against meningococcal disease. You may also need additional booster doses.  Zoster (shingles). If you are age 23 or older, you should get this vaccine.  Varicella (chickenpox). If you have never had chickenpox or you were vaccinated but received only 1 dose, talk to your caregiver to find out if you need this vaccine.  Hepatitis A. You need this vaccine if you have a specific risk factor for hepatitis A virus infection or you simply wish to be protected from this disease. The vaccine is usually given as 2 doses, 6 to 18 months apart.  Hepatitis B. You need this vaccine if you have a specific risk factor for hepatitis B virus infection or you simply wish to be  protected from this disease. The vaccine is given in 3 doses, usually over 6 months.

## 2012-08-21 ENCOUNTER — Ambulatory Visit (INDEPENDENT_AMBULATORY_CARE_PROVIDER_SITE_OTHER)
Admission: RE | Admit: 2012-08-21 | Discharge: 2012-08-21 | Disposition: A | Discharge status: Home / Self Care | Payer: Medicare Other | Source: Ambulatory Visit | Attending: Internal Medicine | Admitting: Internal Medicine

## 2012-08-21 DIAGNOSIS — E2839 Other primary ovarian failure: Secondary | ICD-10-CM

## 2012-08-26 ENCOUNTER — Encounter: Payer: Self-pay | Admitting: Family Medicine

## 2012-10-25 ENCOUNTER — Ambulatory Visit (INDEPENDENT_AMBULATORY_CARE_PROVIDER_SITE_OTHER): Payer: Medicare Other

## 2012-10-25 DIAGNOSIS — Z23 Encounter for immunization: Secondary | ICD-10-CM

## 2013-01-10 ENCOUNTER — Telehealth: Payer: Self-pay | Admitting: Family Medicine

## 2013-01-10 NOTE — Telephone Encounter (Signed)
Received a fax from [employer].  Starting January 23, 2013, Nasonex will no longer be covered.  It will need to be changed to flunisolide, fluticasone, azelastine, patanase or levocetirizine. I spoke to the pt.  She is fine with any choice.  Please advise.  Thanks!

## 2013-01-13 NOTE — Telephone Encounter (Signed)
cahnge to fluticasone 2 sprays each nostril qd

## 2013-01-15 ENCOUNTER — Other Ambulatory Visit: Payer: Self-pay | Admitting: *Deleted

## 2013-01-15 NOTE — Telephone Encounter (Signed)
Left message on machine for pt that it had bveen changed to fluticason and if she needs it refilled to let us know

## 2013-06-30 ENCOUNTER — Other Ambulatory Visit: Payer: Self-pay

## 2013-06-30 DIAGNOSIS — Z1231 Encounter for screening mammogram for malignant neoplasm of breast: Secondary | ICD-10-CM

## 2013-07-30 ENCOUNTER — Encounter (INDEPENDENT_AMBULATORY_CARE_PROVIDER_SITE_OTHER): Payer: Self-pay

## 2013-07-30 ENCOUNTER — Ambulatory Visit
Admission: RE | Admit: 2013-07-30 | Discharge: 2013-07-30 | Disposition: A | Discharge status: Home / Self Care | Payer: Medicare Other | Source: Ambulatory Visit

## 2013-07-30 DIAGNOSIS — Z1231 Encounter for screening mammogram for malignant neoplasm of breast: Secondary | ICD-10-CM

## 2013-07-31 ENCOUNTER — Other Ambulatory Visit: Payer: Self-pay | Admitting: Internal Medicine

## 2013-07-31 DIAGNOSIS — R928 Other abnormal and inconclusive findings on diagnostic imaging of breast: Secondary | ICD-10-CM

## 2013-08-05 ENCOUNTER — Other Ambulatory Visit: Payer: Self-pay | Admitting: Internal Medicine

## 2013-08-05 ENCOUNTER — Other Ambulatory Visit: Payer: Self-pay

## 2013-08-05 DIAGNOSIS — R928 Other abnormal and inconclusive findings on diagnostic imaging of breast: Secondary | ICD-10-CM

## 2013-08-11 ENCOUNTER — Ambulatory Visit
Admission: RE | Admit: 2013-08-11 | Discharge: 2013-08-11 | Disposition: A | Discharge status: Home / Self Care | Payer: Medicare Other | Source: Ambulatory Visit | Attending: Internal Medicine | Admitting: Internal Medicine

## 2013-08-11 DIAGNOSIS — R928 Other abnormal and inconclusive findings on diagnostic imaging of breast: Secondary | ICD-10-CM

## 2013-08-22 ENCOUNTER — Other Ambulatory Visit: Payer: Medicare Other

## 2013-08-26 ENCOUNTER — Encounter: Payer: Medicare Other | Admitting: Internal Medicine

## 2013-10-03 ENCOUNTER — Other Ambulatory Visit (INDEPENDENT_AMBULATORY_CARE_PROVIDER_SITE_OTHER): Payer: Medicare Other

## 2013-10-03 DIAGNOSIS — E785 Hyperlipidemia, unspecified: Secondary | ICD-10-CM

## 2013-10-03 DIAGNOSIS — Z0000 Encounter for general adult medical examination without abnormal findings: Secondary | ICD-10-CM

## 2013-10-03 LAB — CBC WITH DIFFERENTIAL/PLATELET
BASOS ABS: 0 10*3/uL (ref 0.0–0.1)
Basophils Relative: 0.3 % (ref 0.0–3.0)
EOS PCT: 1.9 % (ref 0.0–5.0)
Eosinophils Absolute: 0.1 10*3/uL (ref 0.0–0.7)
HEMATOCRIT: 35.5 % — AB (ref 36.0–46.0)
Hemoglobin: 11.7 g/dL — ABNORMAL LOW (ref 12.0–15.0)
LYMPHS ABS: 1.6 10*3/uL (ref 0.7–4.0)
Lymphocytes Relative: 25.4 % (ref 12.0–46.0)
MCHC: 33 g/dL (ref 30.0–36.0)
MCV: 92 fl (ref 78.0–100.0)
MONO ABS: 0.6 10*3/uL (ref 0.1–1.0)
Monocytes Relative: 9.1 % (ref 3.0–12.0)
Neutro Abs: 4.1 10*3/uL (ref 1.4–7.7)
Neutrophils Relative %: 63.3 % (ref 43.0–77.0)
PLATELETS: 268 10*3/uL (ref 150.0–400.0)
RBC: 3.87 Mil/uL (ref 3.87–5.11)
RDW: 13.7 % (ref 11.5–15.5)
WBC: 6.4 10*3/uL (ref 4.0–10.5)

## 2013-10-03 LAB — HEPATIC FUNCTION PANEL
ALT: 13 U/L (ref 0–35)
AST: 22 U/L (ref 0–37)
Albumin: 3.4 g/dL — ABNORMAL LOW (ref 3.5–5.2)
Alkaline Phosphatase: 90 U/L (ref 39–117)
BILIRUBIN TOTAL: 0.6 mg/dL (ref 0.2–1.2)
Bilirubin, Direct: 0.1 mg/dL (ref 0.0–0.3)
Total Protein: 7.2 g/dL (ref 6.0–8.3)

## 2013-10-03 LAB — BASIC METABOLIC PANEL
BUN: 13 mg/dL (ref 6–23)
CHLORIDE: 110 meq/L (ref 96–112)
CO2: 23 mEq/L (ref 19–32)
Calcium: 9.1 mg/dL (ref 8.4–10.5)
Creatinine, Ser: 0.9 mg/dL (ref 0.4–1.2)
GFR: 85.74 mL/min (ref >60.00)
GLUCOSE: 87 mg/dL (ref 70–99)
POTASSIUM: 3.7 meq/L (ref 3.5–5.1)
Sodium: 141 mEq/L (ref 135–145)

## 2013-10-03 LAB — LIPID PANEL
CHOLESTEROL: 192 mg/dL (ref 0–200)
HDL: 61.2 mg/dL (ref >39.00)
LDL Cholesterol: 121 mg/dL — ABNORMAL HIGH (ref 0–99)
NonHDL: 130.8
Total CHOL/HDL Ratio: 3
Triglycerides: 47 mg/dL (ref 0.0–149.0)
VLDL: 9.4 mg/dL (ref 0.0–40.0)

## 2013-10-03 LAB — TSH: TSH: 1.97 u[IU]/mL (ref 0.35–4.50)

## 2013-10-08 ENCOUNTER — Encounter: Payer: Self-pay | Admitting: Internal Medicine

## 2013-10-08 ENCOUNTER — Ambulatory Visit (INDEPENDENT_AMBULATORY_CARE_PROVIDER_SITE_OTHER): Payer: Medicare Other | Admitting: Internal Medicine

## 2013-10-08 VITALS — BP 136/86 | Temp 98.0°F | Ht 63.0 in | Wt 176.0 lb

## 2013-10-08 DIAGNOSIS — Z23 Encounter for immunization: Secondary | ICD-10-CM

## 2013-10-08 DIAGNOSIS — D509 Iron deficiency anemia, unspecified: Secondary | ICD-10-CM

## 2013-10-08 DIAGNOSIS — J309 Allergic rhinitis, unspecified: Secondary | ICD-10-CM

## 2013-10-08 DIAGNOSIS — Z0000 Encounter for general adult medical examination without abnormal findings: Secondary | ICD-10-CM

## 2013-10-08 MED ORDER — FLUTICASONE FUROATE 27.5 MCG/SPRAY NA SUSP: 2.0000 | Freq: Every day | NASAL | Status: DC

## 2013-10-08 NOTE — Progress Notes (Signed)
Pre visit review using our clinic review tool, if applicable. No additional management support is needed unless otherwise documented below in the visit note.  Chief Complaint  Patient presents with  . Medicare Wellness    HPI: Patient comes in today for Preventive Medicare wellness visit . No major injuries, ed visits ,hospitalizations , new medications since last visit.  Needs refill for allergy nose med No bleeding not on iron at this time no blood donation had anemia ain past and evaluation n No cp sob gi bleeding   Health Maintenance  Topic Date Due  . Influenza Vaccine  08/23/2013  . Tetanus/tdap  01/23/2014  . Mammogram  08/12/2015  . Colonoscopy  04/10/2017  . Pneumococcal Polysaccharide Vaccine Age 20 And Over  Completed  . Zostavax  Completed   Health Maintenance Review LIFESTYLE:  Exercise:  Walking   Tobacco/ETS: no Alcohol: ocass  Sugar beverages: sugar sub  Husband  Diabetic  Sleep:  6 hours   Drug use: no Bone density:  dexa nl 2014    Colonoscopy: ? 2009 [facility]    Hearing: ok  Vision:  No limitations at present . Last eye check UTDglasses  Safety:  Has smoke detector and wears seat belts.  No firearms. No excess sun exposure. Sees dentist regularly.  Falls: no  Advance directive :  Reviewed    Memory: Felt to be good  , no concern from her or her family.  Depression: No anhedonia unusual crying or depressive symptoms  Nutrition: Eats well balanced diet; adequate calcium and vitamin D. No swallowing chewing problems.  Injury: no major injuries in the last six months.  Other healthcare providers:  Reviewed today .  Social:  Lives with spouse married. No pets.   Preventive parameters: up-to-date  Reviewed   ADLS:   There are no problems or need for assistance  driving, feeding, obtaining food, dressing, toileting and bathing, managing money using phone. She is independent.  EXERCISE/ HABITS  Per week   No tobacco     etoh   ROS:  GEN/ HEENT: No fever, significant weight changes sweats headaches vision problems hearing changes, CV/ PULM; No chest pain shortness of breath cough, syncope,edema  change in exercise tolerance. GI /GU: No adominal pain, vomiting, change in bowel habits. No blood in the stool. No significant GU symptoms. SKIN/HEME: ,no acute skin rashes suspicious lesions or bleeding. No lymphadenopathy, nodules, masses.  NEURO/ PSYCH:  No neurologic signs such as weakness numbness. No depression anxiety. IMM/ Allergy: No unusual infections.  Allergy .   REST of 12 system review negative except as per HPI   Past Medical History  Diagnosis Date  . Allergy   . Hyperlipidemia   . Anemia   . Sickle cell trait   . History of varicella   . Gastritis     EGD neg bx 2011   . Hiatal hernia     small on egd  . Hx: UTI (urinary tract infection)     pyelo  . Pyelonephritis 06/04/2010    Family History  Problem Relation Age of Onset  . Adopted: Yes  . Hypertension Mother     family hx  . Heart failure Mother     History   Social History  . Marital Status: Married    Spouse Name: N/A    Number of Children: N/A  . Years of Education: N/A   Social History Main Topics  . Smoking status: Former Games developer  . Smokeless  tobacco: None  . Alcohol Use: Yes     Comment: socially  . Drug Use: No  . Sexual Activity:    Other Topics Concern  . 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 Cholecalciferol (VITAMIN D) 1000 UNITS capsule Take 1,000 Units by mouth daily.    . fluticasone (VERAMYST) 27.5 MCG/SPRAY nasal spray Place 2 sprays into the nose daily.  . Multiple Vitamin (MULTIVITAMIN) tablet Take 1 tablet by mouth daily.      . [DISCONTINUED] fluticasone (VERAMYST) 27.5 MCG/SPRAY nasal spray Place 2 sprays into the nose daily.  . [DISCONTINUED] conjugated estrogens (PREMARIN) vaginal cream Place vaginally daily. 0. 5- 1 gram intravaginally for 2 week .then decrease to . 5 gm 2-3 x per week  . [DISCONTINUED] omeprazole (PRILOSEC) 20 MG capsule As directed one a day  . [DISCONTINUED] valACYclovir (VALTREX) 1000 MG tablet TAKE 1 TABLET 3 TIMES A DAY    EXAM:  BP 136/86  Temp(Src) 98 F (36.7 C) (Oral)  Ht  (1.6 m)  Wt 176 lb (79.833 kg)  BMI 31.18 kg/m2  Body mass index is 31.18 kg/(m^2).  Physical Exam: Vital signs reviewed UYQ:IHKV is a well-developed well-nourished alert cooperative   who appears stated age in no acute distress.  HEENT: normocephalic atraumatic , Eyes: PERRL EOM's full, conjunctiva clear, Nares: paten,t no deformity discharge or tenderness., Ears: no deformity EAC's clear TMs with normal landmarks. Mouth: clear OP, no lesions, edema.  Moist mucous membranes. Dentition in adequate repair. NECK: supple without masses, thyromegaly or bruits. CHEST/PULM:  Clear to auscultation and percussion breath sounds equal no wheeze , rales or rhonchi. No chest wall deformities or tenderness. CV: PMI is nondisplaced, S1 S2 no gallops, murmurs, rubs. Peripheral pulses are full without delay.No JVD .  Breast: normal by inspection . No dimpling, discharge, masses, tenderness or discharge . ABDOMEN: Bowel sounds normal nontender  No guard or rebound, no hepato splenomegal no CVA tenderness.  No hernia. Extremtities:  No clubbing cyanosis or edema, no acute joint swelling or redness no focal atrophy NEURO:  Oriented x3, cranial nerves 3-12 appear to be intact, no obvious focal weakness,gait within normal limits no abnormal reflexes or asymmetrical SKIN: No acute rashes normal turgor, color, no bruising or petechiae. PSYCH: Oriented, good eye contact, no obvious depression anxiety, cognition and judgment  appear normal. LN: no cervical axillary inguinal adenopathy No noted deficits in memory, attention, and speech.   Lab Results  Component Value Date   WBC 6.4 10/03/2013   HGB 11.7* 10/03/2013   HCT 35.5* 10/03/2013   PLT 268.0 10/03/2013   GLUCOSE 87 10/03/2013   CHOL 192 10/03/2013   TRIG 47.0 10/03/2013   HDL 61.20 10/03/2013   LDLDIRECT 122.4 04/25/2011   LDLCALC 121* 10/03/2013   ALT 13 10/03/2013   AST 22 10/03/2013   NA 141 10/03/2013   K 3.7 10/03/2013   CL 110 10/03/2013   CREATININE 0.9 10/03/2013   BUN 13 10/03/2013   CO2 23 10/03/2013   TSH 1.97 10/03/2013    ASSESSMENT AND PLAN:  Discussed the following assessment and plan:  Routine general medical examination at a health care facility - We'll give flu vaccine 1 high-dose is available otherwise up to date. Bone density in 3-5 years. Last one normal  ANEMIA, IRON DEFICIENCY - iron dutdies not done with lab as planned after last visit do stool cards fu and record review with labs   Medicare annual wellness visit, subsequent  Need for vaccination with 13-polyvalent pneumococcal conjugate vaccine - Plan: Pneumococcal conjugate vaccine 13-valent  Allergic rhinitis, cause unspecified - refill medication Stool cards given  Past had antral gastritis 2012 egd dr start and neg bx small h and rx with prilosec and iron  Says she tends to be anemia  When child and when pregnant also  Responds well to oral iron last time Patient Care Team: Madelin Headings, MD as PCP - General Meryl Dare, MD (Gastroenterology)  Patient Instructions  Continue lifestyle intervention healthy eating and exercise . Plan dexa scan in 3-4 years . Will review record about the borderline anemia. Plan stool tests for blood . Recheck cbc diff and ibc ferritin in 3 months and then ROV .  Healthy lifestyle includes : At least 150 minutes of exercise weeks  , weight at healthy levels, which is usually   BMI 19-25. Avoid trans fats and processed foods;   Increase fresh fruits and veges to 5 servings per day. And avoid sweet beverages including tea and juice. Mediterranean diet with olive oil and nuts have been noted to be heart and brain healthy . Avoid tobacco products . Limit  alcohol to  7 per week for women and 14 servings for men.  Get adequate sleep . Wear seat belts . Don't text and drive .       Neta Mends. Rigby Leonhardt M.D.

## 2013-10-08 NOTE — Patient Instructions (Signed)
Continue lifestyle intervention healthy eating and exercise . Plan dexa scan in 3-4 years . Will review record about the borderline anemia. Plan stool tests for blood . Recheck cbc diff and ibc ferritin in 3 months and then ROV .  Healthy lifestyle includes : At least 150 minutes of exercise weeks  , weight at healthy levels, which is usually   BMI 19-25. Avoid trans fats and processed foods;  Increase fresh fruits and veges to 5 servings per day. And avoid sweet beverages including tea and juice. Mediterranean diet with olive oil and nuts have been noted to be heart and brain healthy . Avoid tobacco products . Limit  alcohol to  7 per week for women and 14 servings for men.  Get adequate sleep . Wear seat belts . Don't text and drive .

## 2013-10-17 ENCOUNTER — Other Ambulatory Visit: Payer: Medicare Other

## 2013-10-23 ENCOUNTER — Other Ambulatory Visit: Payer: Self-pay | Admitting: Family Medicine

## 2013-10-23 MED ORDER — FLUTICASONE PROPIONATE 50 MCG/ACT NA SUSP: 2.0000 | Freq: Every day | NASAL | Status: DC

## 2013-10-29 ENCOUNTER — Encounter: Payer: Self-pay | Admitting: Gastroenterology

## 2014-01-07 ENCOUNTER — Other Ambulatory Visit (INDEPENDENT_AMBULATORY_CARE_PROVIDER_SITE_OTHER): Payer: Medicare Other

## 2014-01-07 ENCOUNTER — Other Ambulatory Visit: Payer: Self-pay | Admitting: Internal Medicine

## 2014-01-07 ENCOUNTER — Other Ambulatory Visit: Payer: Self-pay | Admitting: Family Medicine

## 2014-01-07 ENCOUNTER — Other Ambulatory Visit: Payer: Self-pay

## 2014-01-07 DIAGNOSIS — D649 Anemia, unspecified: Secondary | ICD-10-CM

## 2014-01-07 DIAGNOSIS — R921 Mammographic calcification found on diagnostic imaging of breast: Secondary | ICD-10-CM

## 2014-01-07 LAB — CBC WITH DIFFERENTIAL/PLATELET
BASOS PCT: 0.5 % (ref 0.0–3.0)
Basophils Absolute: 0 10*3/uL (ref 0.0–0.1)
EOS PCT: 1.7 % (ref 0.0–5.0)
Eosinophils Absolute: 0.1 10*3/uL (ref 0.0–0.7)
HEMATOCRIT: 37.3 % (ref 36.0–46.0)
Hemoglobin: 12.3 g/dL (ref 12.0–15.0)
LYMPHS ABS: 1.7 10*3/uL (ref 0.7–4.0)
Lymphocytes Relative: 27.5 % (ref 12.0–46.0)
MCHC: 32.9 g/dL (ref 30.0–36.0)
MCV: 91.3 fl (ref 78.0–100.0)
MONO ABS: 0.7 10*3/uL (ref 0.1–1.0)
Monocytes Relative: 11.9 % (ref 3.0–12.0)
Neutro Abs: 3.5 10*3/uL (ref 1.4–7.7)
Neutrophils Relative %: 58.4 % (ref 43.0–77.0)
PLATELETS: 291 10*3/uL (ref 150.0–400.0)
RBC: 4.09 Mil/uL (ref 3.87–5.11)
RDW: 13.7 % (ref 11.5–15.5)
WBC: 6 10*3/uL (ref 4.0–10.5)

## 2014-01-07 LAB — IBC PANEL
IRON: 73 ug/dL (ref 42–145)
Saturation Ratios: 23.4 % (ref 20.0–50.0)
Transferrin: 222.7 mg/dL (ref 212.0–360.0)

## 2014-01-07 LAB — FERRITIN: Ferritin: 76.1 ng/mL (ref 10.0–291.0)

## 2014-02-11 ENCOUNTER — Ambulatory Visit
Admission: RE | Admit: 2014-02-11 | Discharge: 2014-02-11 | Disposition: A | Discharge status: Home / Self Care | Payer: PPO | Source: Ambulatory Visit | Attending: Internal Medicine | Admitting: Internal Medicine

## 2014-02-11 DIAGNOSIS — R921 Mammographic calcification found on diagnostic imaging of breast: Secondary | ICD-10-CM

## 2014-07-29 ENCOUNTER — Other Ambulatory Visit: Payer: Self-pay

## 2014-07-29 ENCOUNTER — Other Ambulatory Visit: Payer: Self-pay | Admitting: Internal Medicine

## 2014-07-29 DIAGNOSIS — R921 Mammographic calcification found on diagnostic imaging of breast: Secondary | ICD-10-CM

## 2014-09-03 ENCOUNTER — Ambulatory Visit
Admission: RE | Admit: 2014-09-03 | Discharge: 2014-09-03 | Disposition: A | Discharge status: Home / Self Care | Payer: PPO | Source: Ambulatory Visit | Attending: Internal Medicine | Admitting: Internal Medicine

## 2014-09-03 DIAGNOSIS — R921 Mammographic calcification found on diagnostic imaging of breast: Secondary | ICD-10-CM

## 2014-10-09 ENCOUNTER — Ambulatory Visit (INDEPENDENT_AMBULATORY_CARE_PROVIDER_SITE_OTHER): Payer: PPO | Admitting: Family Medicine

## 2014-10-09 DIAGNOSIS — Z23 Encounter for immunization: Secondary | ICD-10-CM | POA: Diagnosis not present

## 2015-02-23 ENCOUNTER — Other Ambulatory Visit (INDEPENDENT_AMBULATORY_CARE_PROVIDER_SITE_OTHER): Payer: PPO

## 2015-02-23 DIAGNOSIS — Z0000 Encounter for general adult medical examination without abnormal findings: Secondary | ICD-10-CM

## 2015-02-23 LAB — CBC WITH DIFFERENTIAL/PLATELET
BASOS PCT: 0.4 % (ref 0.0–3.0)
Basophils Absolute: 0 10*3/uL (ref 0.0–0.1)
EOS ABS: 0.2 10*3/uL (ref 0.0–0.7)
EOS PCT: 2.3 % (ref 0.0–5.0)
HCT: 38.1 % (ref 36.0–46.0)
HEMOGLOBIN: 12.3 g/dL (ref 12.0–15.0)
Lymphocytes Relative: 22.9 % (ref 12.0–46.0)
Lymphs Abs: 1.6 10*3/uL (ref 0.7–4.0)
MCHC: 32.3 g/dL (ref 30.0–36.0)
MCV: 91.9 fl (ref 78.0–100.0)
MONO ABS: 0.7 10*3/uL (ref 0.1–1.0)
Monocytes Relative: 9.6 % (ref 3.0–12.0)
NEUTROS ABS: 4.6 10*3/uL (ref 1.4–7.7)
Neutrophils Relative %: 64.8 % (ref 43.0–77.0)
PLATELETS: 281 10*3/uL (ref 150.0–400.0)
RBC: 4.15 Mil/uL (ref 3.87–5.11)
RDW: 13.3 % (ref 11.5–15.5)
WBC: 7.1 10*3/uL (ref 4.0–10.5)

## 2015-02-23 LAB — HEPATIC FUNCTION PANEL
ALT: 12 U/L (ref 0–35)
AST: 17 U/L (ref 0–37)
Albumin: 4 g/dL (ref 3.5–5.2)
Alkaline Phosphatase: 99 U/L (ref 39–117)
BILIRUBIN TOTAL: 0.6 mg/dL (ref 0.2–1.2)
Bilirubin, Direct: 0.1 mg/dL (ref 0.0–0.3)
TOTAL PROTEIN: 7.4 g/dL (ref 6.0–8.3)

## 2015-02-23 LAB — LIPID PANEL
CHOL/HDL RATIO: 3
Cholesterol: 186 mg/dL (ref 0–200)
HDL: 63.5 mg/dL (ref >39.00)
LDL CALC: 109 mg/dL — AB (ref 0–99)
NonHDL: 122.55
TRIGLYCERIDES: 66 mg/dL (ref 0.0–149.0)
VLDL: 13.2 mg/dL (ref 0.0–40.0)

## 2015-02-23 LAB — BASIC METABOLIC PANEL
BUN: 10 mg/dL (ref 6–23)
CHLORIDE: 109 meq/L (ref 96–112)
CO2: 26 meq/L (ref 19–32)
CREATININE: 0.85 mg/dL (ref 0.40–1.20)
Calcium: 9.4 mg/dL (ref 8.4–10.5)
GFR: 85.39 mL/min (ref >60.00)
GLUCOSE: 96 mg/dL (ref 70–99)
POTASSIUM: 4.5 meq/L (ref 3.5–5.1)
Sodium: 143 mEq/L (ref 135–145)

## 2015-02-23 LAB — TSH: TSH: 2.85 u[IU]/mL (ref 0.35–4.50)

## 2015-03-02 ENCOUNTER — Ambulatory Visit (INDEPENDENT_AMBULATORY_CARE_PROVIDER_SITE_OTHER): Payer: PPO | Admitting: Internal Medicine

## 2015-03-02 ENCOUNTER — Encounter: Payer: Self-pay | Admitting: Internal Medicine

## 2015-03-02 VITALS — BP 130/84 | Temp 98.3°F | Ht 63.0 in | Wt 191.2 lb

## 2015-03-02 DIAGNOSIS — Z0000 Encounter for general adult medical examination without abnormal findings: Secondary | ICD-10-CM

## 2015-03-02 NOTE — Progress Notes (Signed)
Pre visit review using our clinic review tool, if applicable. No additional management support is needed unless otherwise documented below in the visit note.  Chief Complaint  Patient presents with  . Medicare Wellness    yearly exam labs    HPI: Cathy Meyers 70 y.o. comes in today for Preventive Medicare /wellness visit .Since last visit. Doing ok no change in health  Battling weight trying to get healthier  Health Maintenance  Topic Date Due  . Hepatitis C Screening  09-Jul-1945  . TETANUS/TDAP  01/23/2014  . INFLUENZA VACCINE  08/24/2015  . MAMMOGRAM  09/02/2016  . COLONOSCOPY  04/10/2017  . DEXA SCAN  Completed  . ZOSTAVAX  Completed  . PNA vac Low Risk Adult  Completed   Health Maintenance Review LIFESTYLE:  TAD:  No etoh seldom ,  Sugar beverages: green tea  Sleep:  6 hours  Exercise walks 6 miles      Hearing:  Good   Vision:  No limitations at present . Last eye check UTD  Safety:  Has smoke detector and wears seat belts.  No firearms. No excess sun exposure. Sees dentist regularly.  Falls: n  Memory: Felt to be good  , no concern from her or her family.  Depression: No anhedonia unusual crying or depressive symptoms  Nutrition: Eats well balanced diet; adequate calcium and vitamin D. No swallowing chewing problems.  Injury: no major injuries in the last six months.  Other healthcare providers:  Reviewed today .  Social:  Lives with spouse married. No pets.   Preventive parameters: up-to-date  Reviewed   ADLS:   There are no problems or need for assistance  driving, feeding, obtaining food, dressing, toileting and bathing, managing money using phone. She is independent.     ROS:  GEN/ HEENT: No fever, significant weight changes sweats headaches vision problems hearing changes, CV/ PULM; No chest pain shortness of breath cough, syncope,edema  change in exercise tolerance. GI /GU: No adominal pain, vomiting, change in bowel habits. No blood in  the stool. No significant GU symptoms. SKIN/HEME: ,no acute skin rashes suspicious lesions or bleeding. No lymphadenopathy, nodules, masses.  NEURO/ PSYCH:  No neurologic signs such as weakness numbness. No depression anxiety. IMM/ Allergy: No unusual infections.  Allergy .   REST of 12 system review negative except as per HPI   Past Medical History  Diagnosis Date  . Allergy   . Hyperlipidemia   . Anemia   . Sickle cell trait ([place])   . History of varicella   . Gastritis     EGD neg bx 2011   . Hiatal hernia     small on egd  . Hx: UTI (urinary tract infection)     pyelo  . Pyelonephritis 06/04/2010    Family History  Problem Relation Age of Onset  . Adopted: Yes  . Hypertension Mother     family hx  . Heart failure Mother     Social History   Social History  . Marital Status: Married    Spouse Name: N/A  . Number of Children: N/A  . Years of Education: N/A   Social History Main Topics  . Smoking status: Former Research scientist (life sciences)  . Smokeless tobacco: None  . Alcohol Use: Yes     Comment: socially  . Drug Use: No  . Sexual Activity: Not Asked   Other Topics Concern  . 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 Cholecalciferol (VITAMIN D) 1000 UNITS capsule Take 1,000 Units by mouth daily.    . fluticasone (FLONASE) 50 MCG/ACT nasal spray Place 2 sprays into both nostrils daily.  . Multiple Vitamin (MULTIVITAMIN) tablet Take 1 tablet by mouth daily.     No facility-administered encounter medications on file as of 03/02/2015.    EXAM:  BP 130/84 mmHg  Temp(Src) 98.3 F (36.8 C) (Oral)  Ht _0  (1.6 m)  Wt 191 lb 3.2 oz (86.728 kg)  BMI 33.88 kg/m2  Body mass index is 33.88 kg/(m^2).  Physical  Exam: Vital signs reviewed GEZ:MOQH is a well-developed well-nourished alert cooperative   who appears stated age in no acute distress.  HEENT: normocephalic atraumatic , Eyes: PERRL EOM's full, conjunctiva clear, Nares: paten,t no deformity discharge or tenderness., Ears: no deformity EAC's clear TMs with normal landmarks. Mouth: clear OP, no lesions, edema.  Moist mucous membranes. Dentition in adequate repair. NECK: supple without masses, thyromegaly or bruits. CHEST/PULM:  Clear to auscultation and percussion breath sounds equal no wheeze , rales or rhonchi. No chest wall deformities or tenderness.Breast: normal by inspection . No dimpling, discharge, masses, tenderness or discharge . CV: PMI is nondisplaced, S1 S2 no gallops, murmurs, rubs. Peripheral pulses are full without delay.No JVD .  ABDOMEN: Bowel sounds normal nontender  No guard or rebound, no hepato splenomegal no CVA tenderness.   Extremtities:  No clubbing cyanosis or edema, no acute joint swelling or redness no focal atrophy NEURO:  Oriented x3, cranial nerves 3-12 appear to be intact, no obvious focal weakness,gait within normal limits no abnormal reflexes or asymmetrical SKIN: No acute rashes normal turgor, color, no bruising or petechiae. PSYCH: Oriented, good eye contact, no obvious depression anxiety, cognition and judgment appear normal. LN: no cervical axillary inguinal adenopathy No noted deficits in memory, attention, and speech.   Lab Results  Component Value Date   WBC 7.1 02/23/2015   HGB 12.3 02/23/2015   HCT 38.1 02/23/2015   PLT 281.0 02/23/2015   GLUCOSE 96 02/23/2015   CHOL 186 02/23/2015   TRIG 66.0 02/23/2015   HDL 63.50 02/23/2015   LDLDIRECT 122.4 04/25/2011   LDLCALC 109* 02/23/2015   ALT 12 02/23/2015   AST 17 02/23/2015   NA 143 02/23/2015   K 4.5 02/23/2015   CL 109 02/23/2015   CREATININE 0.85 02/23/2015   BUN 10 02/23/2015   CO2 26 02/23/2015   TSH 2.85 02/23/2015    ASSESSMENT AND  PLAN:  Discussed the following assessment and plan:  Visit for preventive health examination counseling weight watcher  Mediterranean diet add resis ttraining to help   Weight control  Not high  risk can get  Hep c screen at next albs  Patient Care Team: Burnis Medin, MD as PCP - General Ladene Artist, MD (Gastroenterology)  Patient Instructions       Health Maintenance, Female Adopting a healthy lifestyle and getting preventive care can go a long way to promote health and wellness. Talk with your health care provider about what schedule of regular examinations is right for you. This is a good chance for you to check  in with your provider about disease prevention and staying healthy. In between checkups, there are plenty of things you can do on your own. Experts have done a lot of research about which lifestyle changes and preventive measures are most likely to keep you healthy. Ask your health care provider for more information. WEIGHT AND DIET  Eat a healthy diet  Be sure to include plenty of vegetables, fruits, low-fat dairy products, and lean protein.  Do not eat a lot of foods high in solid fats, added sugars, or salt.  Get regular exercise. This is one of the most important things you can do for your health.  Most adults should exercise for at least 150 minutes each week. The exercise should increase your heart rate and make you sweat (moderate-intensity exercise).  Most adults should also do strengthening exercises at least twice a week. This is in addition to the moderate-intensity exercise.  Maintain a healthy weight  Body mass index (BMI) is a measurement that can be used to identify possible weight problems. It estimates body fat based on height and weight. Your health care provider can help determine your BMI and help you achieve or maintain a healthy weight.  For females 29 years of age and older:   A BMI below 18.5 is considered underweight.  A BMI of 18.5  to 24.9 is normal.  A BMI of 25 to 29.9 is considered overweight.  A BMI of 30 and above is considered obese.  Watch levels of cholesterol and blood lipids  You should start having your blood tested for lipids and cholesterol at 69 years of age, then have this test every 5 years.  You may need to have your cholesterol levels checked more often if:  Your lipid or cholesterol levels are high.  You are older than 69 years of age.  You are at high risk for heart disease.  CANCER SCREENING   Lung Cancer  Lung cancer screening is recommended for adults 59-43 years old who are at high risk for lung cancer because of a history of smoking.  A yearly low-dose CT scan of the lungs is recommended for people who:  Currently smoke.  Have quit within the past 15 years.  Have at least a 30-pack-year history of smoking. A pack year is smoking an average of one pack of cigarettes a day for 1 year.  Yearly screening should continue until it has been 15 years since you quit.  Yearly screening should stop if you develop a health problem that would prevent you from having lung cancer treatment.  Breast Cancer  Practice breast self-awareness. This means understanding how your breasts normally appear and feel.  It also means doing regular breast self-exams. Let your health care provider know about any changes, no matter how small.  If you are in your 20s or 30s, you should have a clinical breast exam (CBE) by a health care provider every 1-3 years as part of a regular health exam.  If you are 46 or older, have a CBE every year. Also consider having a breast X-ray (mammogram) every year.  If you have a family history of breast cancer, talk to your health care provider about genetic screening.  If you are at high risk for breast cancer, talk to your health care provider about having an MRI and a mammogram every year.  Breast cancer gene (BRCA) assessment is recommended for women who have  family members with BRCA-related cancers. BRCA-related cancers include:  Breast.  Ovarian.  Tubal.  Peritoneal cancers.  Results of the assessment will determine the need for genetic counseling and BRCA1 and BRCA2 testing. Cervical Cancer Your health care provider may recommend that you be screened regularly for cancer of the pelvic organs (ovaries, uterus, and vagina). This screening involves a pelvic examination, including checking for microscopic changes to the surface of your cervix (Pap test). You may be encouraged to have this screening done every 3 years, beginning at age 48.  For women ages 36-65, health care providers may recommend pelvic exams and Pap testing every 3 years, or they may recommend the Pap and pelvic exam, combined with testing for human papilloma virus (HPV), every 5 years. Some types of HPV increase your risk of cervical cancer. Testing for HPV may also be done on women of any age with unclear Pap test results.  Other health care providers may not recommend any screening for nonpregnant women who are considered low risk for pelvic cancer and who do not have symptoms. Ask your health care provider if a screening pelvic exam is right for you.  If you have had past treatment for cervical cancer or a condition that could lead to cancer, you need Pap tests and screening for cancer for at least 20 years after your treatment. If Pap tests have been discontinued, your risk factors (such as having a new sexual partner) need to be reassessed to determine if screening should resume. Some women have medical problems that increase the chance of getting cervical cancer. In these cases, your health care provider may recommend more frequent screening and Pap tests. Colorectal Cancer  This type of cancer can be detected and often prevented.  Routine colorectal cancer screening usually begins at 69 years of age and continues through 69 years of age.  Your health care provider may  recommend screening at an earlier age if you have risk factors for colon cancer.  Your health care provider may also recommend using home test kits to check for hidden blood in the stool.  A small camera at the end of a tube can be used to examine your colon directly (sigmoidoscopy or colonoscopy). This is done to check for the earliest forms of colorectal cancer.  Routine screening usually begins at age 18.  Direct examination of the colon should be repeated every 5-10 years through 69 years of age. However, you may need to be screened more often if early forms of precancerous polyps or small growths are found. Skin Cancer  Check your skin from head to toe regularly.  Tell your health care provider about any new moles or changes in moles, especially if there is a change in a mole's shape or color.  Also tell your health care provider if you have a mole that is larger than the size of a pencil eraser.  Always use sunscreen. Apply sunscreen liberally and repeatedly throughout the day.  Protect yourself by wearing long sleeves, pants, a wide-brimmed hat, and sunglasses whenever you are outside. HEART DISEASE, DIABETES, AND HIGH BLOOD PRESSURE   High blood pressure causes heart disease and increases the risk of stroke. High blood pressure is more likely to develop in:  People who have blood pressure in the high end of the normal range (130-139/85-89 mm Hg).  People who are overweight or obese.  People who are African American.  If you are 21-54 years of age, have your blood pressure checked every 3-5 years. If you are 74 years of age or older, have  your blood pressure checked every year. You should have your blood pressure measured twice--once when you are at a hospital or clinic, and once when you are not at a hospital or clinic. Record the average of the two measurements. To check your blood pressure when you are not at a hospital or clinic, you can use:  An automated blood pressure  machine at a pharmacy.  A home blood pressure monitor.  If you are between 36 years and 37 years old, ask your health care provider if you should take aspirin to prevent strokes.  Have regular diabetes screenings. This involves taking a blood sample to check your fasting blood sugar level.  If you are at a normal weight and have a low risk for diabetes, have this test once every three years after 69 years of age.  If you are overweight and have a high risk for diabetes, consider being tested at a younger age or more often. PREVENTING INFECTION  Hepatitis B  If you have a higher risk for hepatitis B, you should be screened for this virus. You are considered at high risk for hepatitis B if:  You were born in a country where hepatitis B is common. Ask your health care provider which countries are considered high risk.  Your parents were born in a high-risk country, and you have not been immunized against hepatitis B (hepatitis B vaccine).  You have HIV or AIDS.  You use needles to inject street drugs.  You live with someone who has hepatitis B.  You have had sex with someone who has hepatitis B.  You get hemodialysis treatment.  You take certain medicines for conditions, including cancer, organ transplantation, and autoimmune conditions. Hepatitis C  Blood testing is recommended for:  Everyone born from [birth year] through 1964.  Anyone with known risk factors for hepatitis C. Sexually transmitted infections (STIs)  You should be screened for sexually transmitted infections (STIs) including gonorrhea and chlamydia if:  You are sexually active and are younger than 69 years of age.  You are older than 69 years of age and your health care provider tells you that you are at risk for this type of infection.  Your sexual activity has changed since you were last screened and you are at an increased risk for chlamydia or gonorrhea. Ask your health care provider if you are at risk.  If  you do not have HIV, but are at risk, it may be recommended that you take a prescription medicine daily to prevent HIV infection. This is called pre-exposure prophylaxis (PrEP). You are considered at risk if:  You are sexually active and do not regularly use condoms or know the HIV status of your partner(s).  You take drugs by injection.  You are sexually active with a partner who has HIV. Talk with your health care provider about whether you are at high risk of being infected with HIV. If you choose to begin PrEP, you should first be tested for HIV. You should then be tested every 3 months for as long as you are taking PrEP.  PREGNANCY   If you are premenopausal and you may become pregnant, ask your health care provider about preconception counseling.  If you may become pregnant, take 400 to 800 micrograms (mcg) of folic acid every day.  If you want to prevent pregnancy, talk to your health care provider about birth control (contraception). OSTEOPOROSIS AND MENOPAUSE   Osteoporosis is a disease in which the bones lose minerals and  strength with aging. This can result in serious bone fractures. Your risk for osteoporosis can be identified using a bone density scan.  If you are 96 years of age or older, or if you are at risk for osteoporosis and fractures, ask your health care provider if you should be screened.  Ask your health care provider whether you should take a calcium or vitamin D supplement to lower your risk for osteoporosis.  Menopause may have certain physical symptoms and risks.  Hormone replacement therapy may reduce some of these symptoms and risks. Talk to your health care provider about whether hormone replacement therapy is right for you.  HOME CARE INSTRUCTIONS   Schedule regular health, dental, and eye exams.  Stay current with your immunizations.   Do not use any tobacco products including cigarettes, chewing tobacco, or electronic cigarettes.  If you are  pregnant, do not drink alcohol.  If you are breastfeeding, limit how much and how often you drink alcohol.  Limit alcohol intake to no more than 1 drink per day for nonpregnant women. One drink equals 12 ounces of beer, 5 ounces of wine, or 1 ounces of hard liquor.  Do not use street drugs.  Do not share needles.  Ask your health care provider for help if you need support or information about quitting drugs.  Tell your health care provider if you often feel depressed.  Tell your health care provider if you have ever been abused or do not feel safe at home.   This information is not intended to replace advice given to you by your health care provider. Make sure you discuss any questions you have with your health care provider.   Document Released: 07/25/2010 Document Revised: 01/30/2014 Document Reviewed: 12/11/2012 Elsevier Interactive Patient Education [employer].      Why follow it? Research shows. . Those who follow the Mediterranean diet have a reduced risk of heart disease  . The diet is associated with a reduced incidence of Parkinson's and Alzheimer's diseases . People following the diet may have longer life expectancies and lower rates of chronic diseases  . The Dietary Guidelines for Americans recommends the Mediterranean diet as an eating plan to promote health and prevent disease  What Is the Mediterranean Diet?  . Healthy eating plan based on typical foods and recipes of Mediterranean-style cooking . The diet is primarily a plant based diet; these foods should make up a majority of meals   Starches - Plant based foods should make up a majority of meals - They are an important sources of vitamins, minerals, energy, antioxidants, and fiber - Choose whole grains, foods high in fiber and minimally processed items  - Typical grain sources include wheat, oats, barley, corn, brown rice, bulgar, farro, millet, polenta, couscous  - Various types of beans include  chickpeas, lentils, fava beans, black beans, white beans   Fruits  Veggies - Large quantities of antioxidant rich fruits & veggies; 6 or more servings  - Vegetables can be eaten raw or lightly drizzled with oil and cooked  - Vegetables common to the traditional Mediterranean Diet include: artichokes, arugula, beets, broccoli, brussel sprouts, cabbage, carrots, celery, collard greens, cucumbers, eggplant, kale, leeks, lemons, lettuce, mushrooms, okra, onions, peas, peppers, potatoes, pumpkin, radishes, rutabaga, shallots, spinach, sweet potatoes, turnips, zucchini - Fruits common to the Mediterranean Diet include: apples, apricots, avocados, cherries, clementines, dates, figs, grapefruits, grapes, melons, nectarines, oranges, peaches, pears, pomegranates, strawberries, tangerines  Fats - Replace butter and margarine with  healthy oils, such as olive oil, canola oil, and tahini  - Limit nuts to no more than a handful a day  - Nuts include walnuts, almonds, pecans, pistachios, pine nuts  - Limit or avoid candied, honey roasted or heavily salted nuts - Olives are central to the Mediterranean diet - can be eaten whole or used in a variety of dishes   Meats Protein - Limiting red meat: no more than a few times a month - When eating red meat: choose lean cuts and keep the portion to the size of deck of cards - Eggs: approx. 0 to 4 times a week  - Fish and lean poultry: at least 2 a week  - Healthy protein sources include, chicken, [place], lean beef, lamb - Increase intake of seafood such as tuna, salmon, trout, mackerel, shrimp, scallops - Avoid or limit high fat processed meats such as sausage and bacon  Dairy - Include moderate amounts of low fat dairy products  - Focus on healthy dairy such as fat free yogurt, skim milk, low or reduced fat cheese - Limit dairy products higher in fat such as whole or 2% milk, cheese, ice cream  Alcohol - Moderate amounts of red wine is ok  - No more than 5 oz daily  for women (all ages) and men older than age 93  - No more than 10 oz of wine daily for men younger than 11  Other - Limit sweets and other desserts  - Use herbs and spices instead of salt to flavor foods  - Herbs and spices common to the traditional Mediterranean Diet include: basil, bay leaves, chives, cloves, cumin, fennel, garlic, lavender, marjoram, mint, oregano, parsley, pepper, rosemary, sage, savory, sumac, tarragon, thyme   It's not just a diet, it's a lifestyle:  . The Mediterranean diet includes lifestyle factors typical of those in the region  . Foods, drinks and meals are best eaten with others and savored . Daily physical activity is important for overall good health . This could be strenuous exercise like running and aerobics . This could also be more leisurely activities such as walking, housework, yard-work, or taking the stairs . Moderation is the key; a balanced and healthy diet accommodates most foods and drinks . Consider portion sizes and frequency of consumption of certain foods   Meal Ideas & Options:  . Breakfast:  o Whole wheat toast or whole wheat English muffins with peanut butter & hard boiled egg o Steel cut oats topped with apples & cinnamon and skim milk  o Fresh fruit: banana, strawberries, melon, berries, peaches  o Smoothies: strawberries, bananas, greek yogurt, peanut butter o Low fat greek yogurt with blueberries and granola  o Egg white omelet with spinach and mushrooms o Breakfast couscous: whole wheat couscous, apricots, skim milk, cranberries  . Sandwiches:  o Hummus and grilled vegetables (peppers, zucchini, squash) on whole wheat bread   o Grilled chicken on whole wheat pita with lettuce, tomatoes, cucumbers or tzatziki  o Tuna salad on whole wheat bread: tuna salad made with greek yogurt, olives, red peppers, capers, green onions o Garlic rosemary lamb pita: lamb sauted with garlic, rosemary, salt & pepper; add lettuce, cucumber, greek yogurt  to pita - flavor with lemon juice and black pepper  . Seafood:  o Mediterranean grilled salmon, seasoned with garlic, basil, parsley, lemon juice and black pepper o Shrimp, lemon, and spinach whole-grain pasta salad made with low fat greek yogurt  o Seared scallops with lemon orzo  o Seared tuna steaks seasoned salt, pepper, coriander topped with tomato mixture of olives, tomatoes, olive oil, minced garlic, parsley, green onions and cappers  . Meats:  o Herbed greek chicken salad with kalamata olives, cucumber, feta  o Red bell peppers stuffed with spinach, bulgur, lean ground beef (or lentils) & topped with feta   o Kebabs: skewers of chicken, tomatoes, onions, zucchini, squash  o [place] burgers: made with red onions, mint, dill, lemon juice, feta cheese topped with roasted red peppers . Vegetarian o Cucumber salad: cucumbers, artichoke hearts, celery, red onion, feta cheese, tossed in olive oil & lemon juice  o Hummus and whole grain pita points with a greek salad (lettuce, tomato, feta, olives, cucumbers, red onion) o Lentil soup with celery, carrots made with vegetable broth, garlic, salt and pepper  o Tabouli salad: parsley, bulgur, mint, scallions, cucumbers, tomato, radishes, lemon juice, olive oil, salt and pepper.         Standley Brooking. Mirelle Biskup M.D.

## 2015-03-02 NOTE — Patient Instructions (Addendum)
Health Maintenance, Female Adopting a healthy lifestyle and getting preventive care can go a long way to promote health and wellness. Talk with your health care provider about what schedule of regular examinations is right for you. This is a good chance for you to check in with your provider about disease prevention and staying healthy. In between checkups, there are plenty of things you can do on your own. Experts have done a lot of research about which lifestyle changes and preventive measures are most likely to keep you healthy. Ask your health care provider for more information. WEIGHT AND DIET  Eat a healthy diet  Be sure to include plenty of vegetables, fruits, low-fat dairy products, and lean protein.  Do not eat a lot of foods high in solid fats, added sugars, or salt.  Get regular exercise. This is one of the most important things you can do for your health.  Most adults should exercise for at least 150 minutes each week. The exercise should increase your heart rate and make you sweat (moderate-intensity exercise).  Most adults should also do strengthening exercises at least twice a week. This is in addition to the moderate-intensity exercise.  Maintain a healthy weight  Body mass index (BMI) is a measurement that can be used to identify possible weight problems. It estimates body fat based on height and weight. Your health care provider can help determine your BMI and help you achieve or maintain a healthy weight.  For females 20 years of age and older:   A BMI below 18.5 is considered underweight.  A BMI of 18.5 to 24.9 is normal.  A BMI of 25 to 29.9 is considered overweight.  A BMI of 30 and above is considered obese.  Watch levels of cholesterol and blood lipids  You should start having your blood tested for lipids and cholesterol at 69 years of age, then have this test every 5 years.  You may need to have your cholesterol levels checked more often if:  Your lipid  or cholesterol levels are high.  You are older than 69 years of age.  You are at high risk for heart disease.  CANCER SCREENING   Lung Cancer  Lung cancer screening is recommended for adults 55-80 years old who are at high risk for lung cancer because of a history of smoking.  A yearly low-dose CT scan of the lungs is recommended for people who:  Currently smoke.  Have quit within the past 15 years.  Have at least a 30-pack-year history of smoking. A pack year is smoking an average of one pack of cigarettes a day for 1 year.  Yearly screening should continue until it has been 15 years since you quit.  Yearly screening should stop if you develop a health problem that would prevent you from having lung cancer treatment.  Breast Cancer  Practice breast self-awareness. This means understanding how your breasts normally appear and feel.  It also means doing regular breast self-exams. Let your health care provider know about any changes, no matter how small.  If you are in your 20s or 30s, you should have a clinical breast exam (CBE) by a health care provider every 1-3 years as part of a regular health exam.  If you are 40 or older, have a CBE every year. Also consider having a breast X-ray (mammogram) every year.  If you have a family history of breast cancer, talk to your health care provider about genetic screening.  If you   are at high risk for breast cancer, talk to your health care provider about having an MRI and a mammogram every year.  Breast cancer gene (BRCA) assessment is recommended for women who have family members with BRCA-related cancers. BRCA-related cancers include:  Breast.  Ovarian.  Tubal.  Peritoneal cancers.  Results of the assessment will determine the need for genetic counseling and BRCA1 and BRCA2 testing. Cervical Cancer Your health care provider may recommend that you be screened regularly for cancer of the pelvic organs (ovaries, uterus, and  vagina). This screening involves a pelvic examination, including checking for microscopic changes to the surface of your cervix (Pap test). You may be encouraged to have this screening done every 3 years, beginning at age 21.  For women ages 30-65, health care providers may recommend pelvic exams and Pap testing every 3 years, or they may recommend the Pap and pelvic exam, combined with testing for human papilloma virus (HPV), every 5 years. Some types of HPV increase your risk of cervical cancer. Testing for HPV may also be done on women of any age with unclear Pap test results.  Other health care providers may not recommend any screening for nonpregnant women who are considered low risk for pelvic cancer and who do not have symptoms. Ask your health care provider if a screening pelvic exam is right for you.  If you have had past treatment for cervical cancer or a condition that could lead to cancer, you need Pap tests and screening for cancer for at least 20 years after your treatment. If Pap tests have been discontinued, your risk factors (such as having a new sexual partner) need to be reassessed to determine if screening should resume. Some women have medical problems that increase the chance of getting cervical cancer. In these cases, your health care provider may recommend more frequent screening and Pap tests. Colorectal Cancer  This type of cancer can be detected and often prevented.  Routine colorectal cancer screening usually begins at 69 years of age and continues through 69 years of age.  Your health care provider may recommend screening at an earlier age if you have risk factors for colon cancer.  Your health care provider may also recommend using home test kits to check for hidden blood in the stool.  A small camera at the end of a tube can be used to examine your colon directly (sigmoidoscopy or colonoscopy). This is done to check for the earliest forms of colorectal  cancer.  Routine screening usually begins at age 50.  Direct examination of the colon should be repeated every 5-10 years through 69 years of age. However, you may need to be screened more often if early forms of precancerous polyps or small growths are found. Skin Cancer  Check your skin from head to toe regularly.  Tell your health care provider about any new moles or changes in moles, especially if there is a change in a mole's shape or color.  Also tell your health care provider if you have a mole that is larger than the size of a pencil eraser.  Always use sunscreen. Apply sunscreen liberally and repeatedly throughout the day.  Protect yourself by wearing long sleeves, pants, a wide-brimmed hat, and sunglasses whenever you are outside. HEART DISEASE, DIABETES, AND HIGH BLOOD PRESSURE   High blood pressure causes heart disease and increases the risk of stroke. High blood pressure is more likely to develop in:  People who have blood pressure in the high end   of the normal range (130-139/85-89 mm Hg).  People who are overweight or obese.  People who are African American.  If you are 18-39 years of age, have your blood pressure checked every 3-5 years. If you are 40 years of age or older, have your blood pressure checked every year. You should have your blood pressure measured twice--once when you are at a hospital or clinic, and once when you are not at a hospital or clinic. Record the average of the two measurements. To check your blood pressure when you are not at a hospital or clinic, you can use:  An automated blood pressure machine at a pharmacy.  A home blood pressure monitor.  If you are between 55 years and 79 years old, ask your health care provider if you should take aspirin to prevent strokes.  Have regular diabetes screenings. This involves taking a blood sample to check your fasting blood sugar level.  If you are at a normal weight and have a low risk for diabetes,  have this test once every three years after 69 years of age.  If you are overweight and have a high risk for diabetes, consider being tested at a younger age or more often. PREVENTING INFECTION  Hepatitis B  If you have a higher risk for hepatitis B, you should be screened for this virus. You are considered at high risk for hepatitis B if:  You were born in a country where hepatitis B is common. Ask your health care provider which countries are considered high risk.  Your parents were born in a high-risk country, and you have not been immunized against hepatitis B (hepatitis B vaccine).  You have HIV or AIDS.  You use needles to inject street drugs.  You live with someone who has hepatitis B.  You have had sex with someone who has hepatitis B.  You get hemodialysis treatment.  You take certain medicines for conditions, including cancer, organ transplantation, and autoimmune conditions. Hepatitis C  Blood testing is recommended for:  Everyone born from 1945 through 1965.  Anyone with known risk factors for hepatitis C. Sexually transmitted infections (STIs)  You should be screened for sexually transmitted infections (STIs) including gonorrhea and chlamydia if:  You are sexually active and are younger than 69 years of age.  You are older than 69 years of age and your health care provider tells you that you are at risk for this type of infection.  Your sexual activity has changed since you were last screened and you are at an increased risk for chlamydia or gonorrhea. Ask your health care provider if you are at risk.  If you do not have HIV, but are at risk, it may be recommended that you take a prescription medicine daily to prevent HIV infection. This is called pre-exposure prophylaxis (PrEP). You are considered at risk if:  You are sexually active and do not regularly use condoms or know the HIV status of your partner(s).  You take drugs by injection.  You are sexually  active with a partner who has HIV. Talk with your health care provider about whether you are at high risk of being infected with HIV. If you choose to begin PrEP, you should first be tested for HIV. You should then be tested every 3 months for as long as you are taking PrEP.  PREGNANCY   If you are premenopausal and you may become pregnant, ask your health care provider about preconception counseling.  If you may   you may become pregnant, take 400 to 800 micrograms (mcg) of folic acid every day.  If you want to prevent pregnancy, talk to your health care provider about birth control (contraception). OSTEOPOROSIS AND MENOPAUSE   Osteoporosis is a disease in which the bones lose minerals and strength with aging. This can result in serious bone fractures. Your risk for osteoporosis can be identified using a bone density scan.  If you are 65 years of age or older, or if you are at risk for osteoporosis and fractures, ask your health care provider if you should be screened.  Ask your health care provider whether you should take a calcium or vitamin D supplement to lower your risk for osteoporosis.  Menopause may have certain physical symptoms and risks.  Hormone replacement therapy may reduce some of these symptoms and risks. Talk to your health care provider about whether hormone replacement therapy is right for you.  HOME CARE INSTRUCTIONS   Schedule regular health, dental, and eye exams.  Stay current with your immunizations.   Do not use any tobacco products including cigarettes, chewing tobacco, or electronic cigarettes.  If you are pregnant, do not drink alcohol.  If you are breastfeeding, limit how much and how often you drink alcohol.  Limit alcohol intake to no more than 1 drink per day for nonpregnant women. One drink equals 12 ounces of beer, 5 ounces of wine, or 1 ounces of hard liquor.  Do not use street drugs.  Do not share needles.  Ask your health care provider for help if  you need support or information about quitting drugs.  Tell your health care provider if you often feel depressed.  Tell your health care provider if you have ever been abused or do not feel safe at home.   This information is not intended to replace advice given to you by your health care provider. Make sure you discuss any questions you have with your health care provider.   Document Released: 07/25/2010 Document Revised: 01/30/2014 Document Reviewed: 12/11/2012 Elsevier Interactive Patient Education 2016 [employer]    Why follow it? Research shows. . Those who follow the Mediterranean diet have a reduced risk of heart disease  . The diet is associated with a reduced incidence of Parkinson's and Alzheimer's diseases . People following the diet may have longer life expectancies and lower rates of chronic diseases  . The Dietary Guidelines for Americans recommends the Mediterranean diet as an eating plan to promote health and prevent disease  What Is the Mediterranean Diet?  . Healthy eating plan based on typical foods and recipes of Mediterranean-style cooking . The diet is primarily a plant based diet; these foods should make up a majority of meals   Starches - Plant based foods should make up a majority of meals - They are an important sources of vitamins, minerals, energy, antioxidants, and fiber - Choose whole grains, foods high in fiber and minimally processed items  - Typical grain sources include wheat, oats, barley, corn, brown rice, bulgar, farro, millet, polenta, couscous  - Various types of beans include chickpeas, lentils, fava beans, black beans, white beans   Fruits  Veggies - Large quantities of antioxidant rich fruits & veggies; 6 or more servings  - Vegetables can be eaten raw or lightly drizzled with oil and cooked  - Vegetables common to the traditional Mediterranean Diet include: artichokes, arugula, beets, broccoli, brussel sprouts, cabbage, carrots, celery,  collard greens, cucumbers, eggplant, kale, leeks, lemons, lettuce, mushrooms, okra,   onions, peas, peppers, potatoes, pumpkin, radishes, rutabaga, shallots, spinach, sweet potatoes, turnips, zucchini - Fruits common to the Mediterranean Diet include: apples, apricots, avocados, cherries, clementines, dates, figs, grapefruits, grapes, melons, nectarines, oranges, peaches, pears, pomegranates, strawberries, tangerines  Fats - Replace butter and margarine with healthy oils, such as olive oil, canola oil, and tahini  - Limit nuts to no more than a handful a day  - Nuts include walnuts, almonds, pecans, pistachios, pine nuts  - Limit or avoid candied, honey roasted or heavily salted nuts - Olives are central to the Mediterranean diet - can be eaten whole or used in a variety of dishes   Meats Protein - Limiting red meat: no more than a few times a month - When eating red meat: choose lean cuts and keep the portion to the size of deck of cards - Eggs: approx. 0 to 4 times a week  - Fish and lean poultry: at least 2 a week  - Healthy protein sources include, chicken, turkey, lean beef, lamb - Increase intake of seafood such as tuna, salmon, trout, mackerel, shrimp, scallops - Avoid or limit high fat processed meats such as sausage and bacon  Dairy - Include moderate amounts of low fat dairy products  - Focus on healthy dairy such as fat free yogurt, skim milk, low or reduced fat cheese - Limit dairy products higher in fat such as whole or 2% milk, cheese, ice cream  Alcohol - Moderate amounts of red wine is ok  - No more than 5 oz daily for women (all ages) and men older than age 65  - No more than 10 oz of wine daily for men younger than 65  Other - Limit sweets and other desserts  - Use herbs and spices instead of salt to flavor foods  - Herbs and spices common to the traditional Mediterranean Diet include: basil, bay leaves, chives, cloves, cumin, fennel, garlic, lavender, marjoram, mint, oregano,  parsley, pepper, rosemary, sage, savory, sumac, tarragon, thyme   It's not just a diet, it's a lifestyle:  . The Mediterranean diet includes lifestyle factors typical of those in the region  . Foods, drinks and meals are best eaten with others and savored . Daily physical activity is important for overall good health . This could be strenuous exercise like running and aerobics . This could also be more leisurely activities such as walking, housework, yard-work, or taking the stairs . Moderation is the key; a balanced and healthy diet accommodates most foods and drinks . Consider portion sizes and frequency of consumption of certain foods   Meal Ideas & Options:  . Breakfast:  o Whole wheat toast or whole wheat English muffins with peanut butter & hard boiled egg o Steel cut oats topped with apples & cinnamon and skim milk  o Fresh fruit: banana, strawberries, melon, berries, peaches  o Smoothies: strawberries, bananas, greek yogurt, peanut butter o Low fat greek yogurt with blueberries and granola  o Egg white omelet with spinach and mushrooms o Breakfast couscous: whole wheat couscous, apricots, skim milk, cranberries  . Sandwiches:  o Hummus and grilled vegetables (peppers, zucchini, squash) on whole wheat bread   o Grilled chicken on whole wheat pita with lettuce, tomatoes, cucumbers or tzatziki  o Tuna salad on whole wheat bread: tuna salad made with greek yogurt, olives, red peppers, capers, green onions o Garlic rosemary lamb pita: lamb sauted with garlic, rosemary, salt & pepper; add lettuce, cucumber, greek yogurt to pita - flavor   with lemon juice and black pepper  . Seafood:  o Mediterranean grilled salmon, seasoned with garlic, basil, parsley, lemon juice and black pepper o Shrimp, lemon, and spinach whole-grain pasta salad made with low fat greek yogurt  o Seared scallops with lemon orzo  o Seared tuna steaks seasoned salt, pepper, coriander topped with tomato mixture of  olives, tomatoes, olive oil, minced garlic, parsley, green onions and cappers  . Meats:  o Herbed greek chicken salad with kalamata olives, cucumber, feta  o Red bell peppers stuffed with spinach, bulgur, lean ground beef (or lentils) & topped with feta   o Kebabs: skewers of chicken, tomatoes, onions, zucchini, squash  o Turkey burgers: made with red onions, mint, dill, lemon juice, feta cheese topped with roasted red peppers . Vegetarian o Cucumber salad: cucumbers, artichoke hearts, celery, red onion, feta cheese, tossed in olive oil & lemon juice  o Hummus and whole grain pita points with a greek salad (lettuce, tomato, feta, olives, cucumbers, red onion) o Lentil soup with celery, carrots made with vegetable broth, garlic, salt and pepper  o Tabouli salad: parsley, bulgur, mint, scallions, cucumbers, tomato, radishes, lemon juice, olive oil, salt and pepper.      

## 2015-03-16 ENCOUNTER — Telehealth: Payer: Self-pay | Admitting: Internal Medicine

## 2015-03-18 NOTE — Telephone Encounter (Signed)
error 

## 2015-04-22 ENCOUNTER — Other Ambulatory Visit: Payer: Self-pay | Admitting: Internal Medicine

## 2015-04-23 NOTE — Telephone Encounter (Signed)
Sent to the pharmacy by e-scribe. 

## 2015-05-19 ENCOUNTER — Ambulatory Visit: Payer: PPO | Admitting: Internal Medicine

## 2015-06-23 ENCOUNTER — Ambulatory Visit: Payer: PPO | Admitting: Internal Medicine

## 2015-07-09 ENCOUNTER — Ambulatory Visit: Payer: PPO

## 2015-07-26 ENCOUNTER — Telehealth: Payer: Self-pay

## 2015-07-26 NOTE — Telephone Encounter (Signed)
Left VM to fup [facility]

## 2015-08-16 ENCOUNTER — Other Ambulatory Visit: Payer: Self-pay | Admitting: Internal Medicine

## 2015-08-16 DIAGNOSIS — R921 Mammographic calcification found on diagnostic imaging of breast: Secondary | ICD-10-CM

## 2015-09-09 ENCOUNTER — Ambulatory Visit
Admission: RE | Admit: 2015-09-09 | Discharge: 2015-09-09 | Disposition: A | Discharge status: Home / Self Care | Payer: PPO | Source: Ambulatory Visit | Attending: Internal Medicine | Admitting: Internal Medicine

## 2015-09-09 DIAGNOSIS — R921 Mammographic calcification found on diagnostic imaging of breast: Secondary | ICD-10-CM

## 2015-09-24 ENCOUNTER — Ambulatory Visit (INDEPENDENT_AMBULATORY_CARE_PROVIDER_SITE_OTHER): Payer: PPO

## 2015-09-24 DIAGNOSIS — Z23 Encounter for immunization: Secondary | ICD-10-CM | POA: Diagnosis not present

## 2015-11-18 ENCOUNTER — Other Ambulatory Visit: Payer: Self-pay | Admitting: *Deleted

## 2015-11-18 MED ORDER — FLUTICASONE PROPIONATE 50 MCG/ACT NA SUSP
NASAL | 5 refills | Status: DC
Start: 2015-11-18 — End: 2016-04-13

## 2016-02-22 NOTE — Progress Notes (Deleted)
Subjective:   Cathy Meyers is a 71 y.o. female who presents for an Initial Medicare Annual Wellness Visit.  The Patient was informed that the wellness visit is to identify future health risk and educate and initiate measures that can reduce risk for increased disease through the lifespan.    NO ROS; Medicare Wellness Visit sickle cell;  Hyperlipidemia w ratio of 3; hdl to chol; 63; 186; LDL 161109 and Trig 66 Glucose 96 Family HTN and HF   Psychosocial Works nights   Describes health as good, fair or great?   Patient-completed health risk assessment  - completed and reviewed, see scanned documentation  The following written screening schedule of preventive measures were reviewed with assessment and plan made per below and patient instructions:  Smoking history reviewed / former smoker  Educated regarding LDCT if applicable with a 30 year hx Also educated regarding AAA for female tobacco users Use of Smokeless tobacco  Second Hand Smoke status; No Smokers in the home Dental fup  ETOH drinks socially   RISK FACTORS Regular exercise- educated regarding active lifestyle according to personal preferences Recommendation per NIH reviewed and given options  Diet; incorporates fruits and vegetables;  Barriers noted and given weight loss strategies as indicated  Obesity and weight loss plan discussed  Lipids reviewed and reducing cholesterol discussed  Referral to Diabetes and Nutritional center if appropriate   Fall risk: presented mobile; get up and go WNL  Mobility of Functional changes this year? Safety at home and  Community reviewed; wears sunscreen if in the sun; Keeps firearms in a safe place if they exist Safe driving recommendations for older adults Motor vehicle accidents assessed in the last year   Depression Screen PhQ 2: negative  Activities of Daily Living - See functional screen   Cognitive testing; Ad8 score; 0 or less than 2  MMSE deferred or completed  if AD8 + 2 issues  Advanced Directives reviewed for completion; discussion with MD as well as supportive resources as needed  Patient care team reviewed   Preventives screens reviewed Colonoscopy 03/2007 Mammogram 08/2015 Dexa 07/2012  Normal and to repeat in 3 years DUE  Pap 04/2010    Immunization History  Administered Date(s) Administered  . Influenza Split 11/21/2011, 10/24/2013  . Influenza, High Dose Seasonal PF 09/24/2015  . Influenza,inj,Quad PF,36+ Mos 10/25/2012, 10/09/2014  . Pneumococcal Conjugate-13 10/08/2013  . Pneumococcal Polysaccharide-23 08/14/2012  . Td 01/24/2004  . Zoster 05/02/2011   Required Immunizations needed today  Screening test up to date or reviewed for plan of completion Health Maintenance Due  Topic Date Due  . Hepatitis C Screening  02-Dec-1945  . TETANUS/TDAP  01/23/2014             Objective:    There were no vitals filed for this visit. There is no height or weight on file to calculate BMI.   Current Medications (verified) Outpatient Encounter Prescriptions as of 02/23/2016  Medication Sig  . Cholecalciferol (VITAMIN D) 1000 UNITS capsule Take 1,000 Units by mouth daily.    . fluticasone (FLONASE) 50 MCG/ACT nasal spray PLACE 2 SPRAYS INTO BOTH NOSTRILS DAILY.  . Multiple Vitamin (MULTIVITAMIN) tablet Take 1 tablet by mouth daily.     No facility-administered encounter medications on file as of 02/23/2016.     Allergies (verified) Penicillins   History: Past Medical History:  Diagnosis Date  . Allergy   . Anemia   . Gastritis    EGD neg bx 2011   .  Hiatal hernia    small on egd  . History of varicella   . Hx: UTI (urinary tract infection)    pyelo  . Hyperlipidemia   . Pyelonephritis 06/04/2010  . Sickle cell trait (HCC)    No past surgical history on file. Family History  Problem Relation Age of Onset  . Adopted: Yes  . Hypertension Mother     family hx  . Heart failure Mother    Social History    Occupational History  . Not on file.   Social History Main Topics  . Smoking status: Former Games developer  . Smokeless tobacco: Not on file  . Alcohol use Yes     Comment: socially  . Drug use: No  . Sexual activity: Not on file    Tobacco Counseling Counseling given: Not Answered   Activities of Daily Living No flowsheet data found.  Immunizations and Health Maintenance Immunization History  Administered Date(s) Administered  . Influenza Split 11/21/2011, 10/24/2013  . Influenza, High Dose Seasonal PF 09/24/2015  . Influenza,inj,Quad PF,36+ Mos 10/25/2012, 10/09/2014  . Pneumococcal Conjugate-13 10/08/2013  . Pneumococcal Polysaccharide-23 08/14/2012  . Td 01/24/2004  . Zoster 05/02/2011   Health Maintenance Due  Topic Date Due  . Hepatitis C Screening  November 09, 1945  . TETANUS/TDAP  01/23/2014    Patient Care Team: Madelin Headings, MD as PCP - General Meryl Dare, MD (Gastroenterology)  Indicate any recent Medical Services you may have received from other than [facility] providers in the past year (date may be approximate).     Assessment:   This is a routine wellness examination for [place].   Hearing/Vision screen No exam data present  Dietary issues and exercise activities discussed:    Goals    None     Depression Screen PHQ 2/9 Scores 03/02/2015 10/08/2013 08/14/2012  PHQ - 2 Score 0 0 0    Fall Risk Fall Risk  03/02/2015 10/08/2013 08/14/2012  Falls in the past year? No No No    Cognitive Function:        Screening Tests Health Maintenance  Topic Date Due  . Hepatitis C Screening  16-Nov-1945  . TETANUS/TDAP  01/23/2014  . COLONOSCOPY  04/10/2017  . MAMMOGRAM  09/08/2017  . INFLUENZA VACCINE  Completed  . DEXA SCAN  Completed  . ZOSTAVAX  Completed  . PNA vac Low Risk Adult  Completed      Plan:    During the course of the visit, Trystin was educated and counseled about the following appropriate screening and preventive services:   Vaccines  to include Pneumoccal, Influenza, Hepatitis B, Td, Zostavax, HCV  Electrocardiogram  Cardiovascular disease screening  Colorectal cancer screening  Bone density screening  Diabetes screening  Glaucoma screening  Mammography/PAP  Nutrition counseling  Smoking cessation counseling  Patient Instructions (the written plan) were given to the patient.    Montine Circle, RN   02/22/2016

## 2016-02-23 ENCOUNTER — Other Ambulatory Visit: Payer: PPO

## 2016-02-23 ENCOUNTER — Other Ambulatory Visit (INDEPENDENT_AMBULATORY_CARE_PROVIDER_SITE_OTHER): Payer: PPO

## 2016-02-23 ENCOUNTER — Ambulatory Visit: Payer: PPO

## 2016-02-23 DIAGNOSIS — Z0000 Encounter for general adult medical examination without abnormal findings: Secondary | ICD-10-CM | POA: Diagnosis not present

## 2016-02-23 LAB — CBC WITH DIFFERENTIAL/PLATELET
BASOS PCT: 0.4 % (ref 0.0–3.0)
Basophils Absolute: 0 10*3/uL (ref 0.0–0.1)
EOS ABS: 0.1 10*3/uL (ref 0.0–0.7)
Eosinophils Relative: 1.7 % (ref 0.0–5.0)
HEMATOCRIT: 37.6 % (ref 36.0–46.0)
HEMOGLOBIN: 12.6 g/dL (ref 12.0–15.0)
Lymphocytes Relative: 28 % (ref 12.0–46.0)
Lymphs Abs: 2 10*3/uL (ref 0.7–4.0)
MCHC: 33.5 g/dL (ref 30.0–36.0)
MCV: 89.6 fl (ref 78.0–100.0)
MONOS PCT: 9.3 % (ref 3.0–12.0)
Monocytes Absolute: 0.7 10*3/uL (ref 0.1–1.0)
Neutro Abs: 4.3 10*3/uL (ref 1.4–7.7)
Neutrophils Relative %: 60.6 % (ref 43.0–77.0)
PLATELETS: 294 10*3/uL (ref 150.0–400.0)
RBC: 4.19 Mil/uL (ref 3.87–5.11)
RDW: 13.6 % (ref 11.5–15.5)
WBC: 7.2 10*3/uL (ref 4.0–10.5)

## 2016-02-23 LAB — HEPATIC FUNCTION PANEL
ALBUMIN: 4.1 g/dL (ref 3.5–5.2)
ALK PHOS: 117 U/L (ref 39–117)
ALT: 10 U/L (ref 0–35)
AST: 14 U/L (ref 0–37)
Bilirubin, Direct: 0.2 mg/dL (ref 0.0–0.3)
TOTAL PROTEIN: 7 g/dL (ref 6.0–8.3)
Total Bilirubin: 1.2 mg/dL (ref 0.2–1.2)

## 2016-02-23 LAB — BASIC METABOLIC PANEL
BUN: 12 mg/dL (ref 6–23)
CHLORIDE: 108 meq/L (ref 96–112)
CO2: 27 meq/L (ref 19–32)
Calcium: 9.4 mg/dL (ref 8.4–10.5)
Creatinine, Ser: 0.87 mg/dL (ref 0.40–1.20)
GFR: 82.88 mL/min (ref >60.00)
Glucose, Bld: 94 mg/dL (ref 70–99)
Potassium: 4.4 mEq/L (ref 3.5–5.1)
SODIUM: 142 meq/L (ref 135–145)

## 2016-02-23 LAB — LIPID PANEL
CHOL/HDL RATIO: 3
Cholesterol: 193 mg/dL (ref 0–200)
HDL: 60.3 mg/dL (ref >39.00)
LDL Cholesterol: 121 mg/dL — ABNORMAL HIGH (ref 0–99)
NONHDL: 132.62
Triglycerides: 58 mg/dL (ref 0.0–149.0)
VLDL: 11.6 mg/dL (ref 0.0–40.0)

## 2016-02-23 LAB — TSH: TSH: 3.21 u[IU]/mL (ref 0.35–4.50)

## 2016-02-25 ENCOUNTER — Ambulatory Visit (INDEPENDENT_AMBULATORY_CARE_PROVIDER_SITE_OTHER): Payer: PPO

## 2016-02-25 VITALS — BP 118/80 | HR 74 | Ht 64.0 in | Wt 191.0 lb

## 2016-02-25 DIAGNOSIS — Z0000 Encounter for general adult medical examination without abnormal findings: Secondary | ICD-10-CM | POA: Diagnosis not present

## 2016-02-25 DIAGNOSIS — Z7289 Other problems related to lifestyle: Secondary | ICD-10-CM

## 2016-02-25 NOTE — Patient Instructions (Addendum)
Cathy Meyers , Thank you for taking time to come for your Medicare Wellness Visit. I appreciate your ongoing commitment to your health goals. Please review the following plan we discussed and let me know if I can assist you in the future.   Will schedule an eye exam   Calcium 1262m total  And Vit d; generally 800 and weight bearing exercise  Discussed repeating dexa in 3 to 5 years;    These are the goals we discussed: Goals    . Weight (lb) < 165 lb (74.8 kg)          Portion control;  Try to keep sodium around 25030mper day   Check out  online nutrition programs as [email or web address] for foods with "whole" wheat; bran; oatmeal etc Shot at the farmer's markets in season for fresher choices  Watch for "hydrogenated" on the label of oils which are trans-fats.  Watch for "high fructose corn syrup" in snacks, yogurt or ketchup  Meats have less marbling; bright colored fruits and vegetables;  Canned; dump out liquid and wash vegetables. Be mindful of what we are eating  Portion control is essential to a health weight! Sit down; take a break and enjoy your meal; take smaller bites; put the fork down between bites;  It takes 20 minutes to get full; so check in with your fullness cues and stop eating when you start to fill full              This is a list of the screening recommended for you and due dates:  Health Maintenance  Topic Date Due  .  Hepatitis C: One time screening is recommended by Center for Disease Control  (CDC) for  adults born from [birth year]rough 1965.   07/December 20, 1945 Tetanus Vaccine  01/23/2014  . Colon Cancer Screening  04/10/2017  . Mammogram  09/08/2017  . Flu Shot  Completed  . DEXA scan (bone density measurement)  Completed  . Shingles Vaccine  Completed  . Pneumonia vaccines  Completed       Bone Densitometry Introduction Bone densitometry is an imaging test that uses a special X-ray to measure the amount of  calcium and other minerals in your bones (bone density). This test is also known as a bone mineral density test or dual-energy X-ray absorptiometry (DXA). The test can measure bone density at your hip and your spine. It is similar to having a regular X-ray. You may have this test to:  Diagnose a condition that causes weak or thin bones (osteoporosis).  Predict your risk of a broken bone (fracture).  Determine how well osteoporosis treatment is working. Tell a health care provider about:  Any allergies you have.  All medicines you are taking, including vitamins, herbs, eye drops, creams, and over-the-counter medicines.  Any problems you or family members have had with anesthetic medicines.  Any blood disorders you have.  Any surgeries you have had.  Any medical conditions you have.  Possibility of pregnancy.  Any other medical test you had within the previous 14 days that used contrast material. What are the risks? Generally, this is a safe procedure. However, problems can occur and may include the following:  This test exposes you to a very small amount of radiation.  The risks of radiation exposure may be greater to unborn children. What happens before the procedure?  Do not take any calcium supplements for 24 hours before having the test. You can otherwise eat  and drink what you usually do.  Take off all metal jewelry, eyeglasses, dental appliances, and any other metal objects. What happens during the procedure?  You may lie on an exam table. There will be an X-ray generator below you and an imaging device above you.  Other devices, such as boxes or braces, may be used to position your body properly for the scan.  You will need to lie still while the machine slowly scans your body.  The images will show up on a computer monitor. What happens after the procedure? You may need more testing at a later time. This information is not intended to replace advice given to you  by your health care provider. Make sure you discuss any questions you have with your health care provider. Document Released: 02/01/2004 Document Revised: 06/17/2015 Document Reviewed: 06/19/2013  2017 [employer]   Fall Prevention in the Home Introduction Falls can cause injuries. They can happen to people of all ages. There are many things you can do to make your home safe and to help prevent falls. What can I do on the outside of my home?  Regularly fix the edges of walkways and driveways and fix any cracks.  Remove anything that might make you trip as you walk through a door, such as a raised step or threshold.  Trim any bushes or trees on the path to your home.  Use bright outdoor lighting.  Clear any walking paths of anything that might make someone trip, such as rocks or tools.  Regularly check to see if handrails are loose or broken. Make sure that both sides of any steps have handrails.  Any raised decks and porches should have guardrails on the edges.  Have any leaves, snow, or ice cleared regularly.  Use sand or salt on walking paths during winter.  Clean up any spills in your garage right away. This includes oil or grease spills. What can I do in the bathroom?  Use night lights.  Install grab bars by the toilet and in the tub and shower. Do not use towel bars as grab bars.  Use non-skid mats or decals in the tub or shower.  If you need to sit down in the shower, use a plastic, non-slip stool.  Keep the floor dry. Clean up any water that spills on the floor as soon as it happens.  Remove soap buildup in the tub or shower regularly.  Attach bath mats securely with double-sided non-slip rug tape.  Do not have throw rugs and other things on the floor that can make you trip. What can I do in the bedroom?  Use night lights.  Make sure that you have a light by your bed that is easy to reach.  Do not use any sheets or blankets that are too big for your bed. They  should not hang down onto the floor.  Have a firm chair that has side arms. You can use this for support while you get dressed.  Do not have throw rugs and other things on the floor that can make you trip. What can I do in the kitchen?  Clean up any spills right away.  Avoid walking on wet floors.  Keep items that you use a lot in easy-to-reach places.  If you need to reach something above you, use a strong step stool that has a grab bar.  Keep electrical cords out of the way.  Do not use floor polish or wax that makes floors slippery.  If you must use wax, use non-skid floor wax.  Do not have throw rugs and other things on the floor that can make you trip. What can I do with my stairs?  Do not leave any items on the stairs.  Make sure that there are handrails on both sides of the stairs and use them. Fix handrails that are broken or loose. Make sure that handrails are as long as the stairways.  Check any carpeting to make sure that it is firmly attached to the stairs. Fix any carpet that is loose or worn.  Avoid having throw rugs at the top or bottom of the stairs. If you do have throw rugs, attach them to the floor with carpet tape.  Make sure that you have a light switch at the top of the stairs and the bottom of the stairs. If you do not have them, ask someone to add them for you. What else can I do to help prevent falls?  Wear shoes that:  Do not have high heels.  Have rubber bottoms.  Are comfortable and fit you well.  Are closed at the toe. Do not wear sandals.  If you use a stepladder:  Make sure that it is fully opened. Do not climb a closed stepladder.  Make sure that both sides of the stepladder are locked into place.  Ask someone to hold it for you, if possible.  Clearly mark and make sure that you can see:  Any grab bars or handrails.  First and last steps.  Where the edge of each step is.  Use tools that help you move around (mobility aids) if  they are needed. These include:  Canes.  Walkers.  Scooters.  Crutches.  Turn on the lights when you go into a dark area. Replace any light bulbs as soon as they burn out.  Set up your furniture so you have a clear path. Avoid moving your furniture around.  If any of your floors are uneven, fix them.  If there are any pets around you, be aware of where they are.  Review your medicines with your doctor. Some medicines can make you feel dizzy. This can increase your chance of falling. Ask your doctor what other things that you can do to help prevent falls. This information is not intended to replace advice given to you by your health care provider. Make sure you discuss any questions you have with your health care provider. Document Released: 11/05/2008 Document Revised: 06/17/2015 Document Reviewed: 02/13/2014  2017 [employer]  Health Maintenance, Female Introduction Adopting a healthy lifestyle and getting preventive care can go a long way to promote health and wellness. Talk with your health care provider about what schedule of regular examinations is right for you. This is a good chance for you to check in with your provider about disease prevention and staying healthy. In between checkups, there are plenty of things you can do on your own. Experts have done a lot of research about which lifestyle changes and preventive measures are most likely to keep you healthy. Ask your health care provider for more information. Weight and diet Eat a healthy diet  Be sure to include plenty of vegetables, fruits, low-fat dairy products, and lean protein.  Do not eat a lot of foods high in solid fats, added sugars, or salt.  Get regular exercise. This is one of the most important things you can do for your health.  Most adults should exercise for at least 150  minutes each week. The exercise should increase your heart rate and make you sweat (moderate-intensity exercise).  Most adults should  also do strengthening exercises at least twice a week. This is in addition to the moderate-intensity exercise. Maintain a healthy weight  Body mass index (BMI) is a measurement that can be used to identify possible weight problems. It estimates body fat based on height and weight. Your health care provider can help determine your BMI and help you achieve or maintain a healthy weight.  For females 109 years of age and older:  A BMI below 18.5 is considered underweight.  A BMI of 18.5 to 24.9 is normal.  A BMI of 25 to 29.9 is considered overweight.  A BMI of 30 and above is considered obese. Watch levels of cholesterol and blood lipids  You should start having your blood tested for lipids and cholesterol at 70 years of age, then have this test every 5 years.  You may need to have your cholesterol levels checked more often if:  Your lipid or cholesterol levels are high.  You are older than 70 years of age.  You are at high risk for heart disease. Cancer screening Lung Cancer  Lung cancer screening is recommended for adults 93-38 years old who are at high risk for lung cancer because of a history of smoking.  A yearly low-dose CT scan of the lungs is recommended for people who:  Currently smoke.  Have quit within the past 15 years.  Have at least a 30-pack-year history of smoking. A pack year is smoking an average of one pack of cigarettes a day for 1 year.  Yearly screening should continue until it has been 15 years since you quit.  Yearly screening should stop if you develop a health problem that would prevent you from having lung cancer treatment. Breast Cancer  Practice breast self-awareness. This means understanding how your breasts normally appear and feel.  It also means doing regular breast self-exams. Let your health care provider know about any changes, no matter how small.  If you are in your 20s or 30s, you should have a clinical breast exam (CBE) by a health  care provider every 1-3 years as part of a regular health exam.  If you are 15 or older, have a CBE every year. Also consider having a breast X-ray (mammogram) every year.  If you have a family history of breast cancer, talk to your health care provider about genetic screening.  If you are at high risk for breast cancer, talk to your health care provider about having an MRI and a mammogram every year.  Breast cancer gene (BRCA) assessment is recommended for women who have family members with BRCA-related cancers. BRCA-related cancers include:  Breast.  Ovarian.  Tubal.  Peritoneal cancers.  Results of the assessment will determine the need for genetic counseling and BRCA1 and BRCA2 testing. Cervical Cancer  Your health care provider may recommend that you be screened regularly for cancer of the pelvic organs (ovaries, uterus, and vagina). This screening involves a pelvic examination, including checking for microscopic changes to the surface of your cervix (Pap test). You may be encouraged to have this screening done every 3 years, beginning at age 61.  For women ages 2-65, health care providers may recommend pelvic exams and Pap testing every 3 years, or they may recommend the Pap and pelvic exam, combined with testing for human papilloma virus (HPV), every 5 years. Some types of HPV increase your  risk of cervical cancer. Testing for HPV may also be done on women of any age with unclear Pap test results.  Other health care providers may not recommend any screening for nonpregnant women who are considered low risk for pelvic cancer and who do not have symptoms. Ask your health care provider if a screening pelvic exam is right for you.  If you have had past treatment for cervical cancer or a condition that could lead to cancer, you need Pap tests and screening for cancer for at least 20 years after your treatment. If Pap tests have been discontinued, your risk factors (such as having a new  sexual partner) need to be reassessed to determine if screening should resume. Some women have medical problems that increase the chance of getting cervical cancer. In these cases, your health care provider may recommend more frequent screening and Pap tests. Colorectal Cancer  This type of cancer can be detected and often prevented.  Routine colorectal cancer screening usually begins at 70 years of age and continues through 70 years of age.  Your health care provider may recommend screening at an earlier age if you have risk factors for colon cancer.  Your health care provider may also recommend using home test kits to check for hidden blood in the stool.  A small camera at the end of a tube can be used to examine your colon directly (sigmoidoscopy or colonoscopy). This is done to check for the earliest forms of colorectal cancer.  Routine screening usually begins at age 108.  Direct examination of the colon should be repeated every 5-10 years through 70 years of age. However, you may need to be screened more often if early forms of precancerous polyps or small growths are found. Skin Cancer  Check your skin from head to toe regularly.  Tell your health care provider about any new moles or changes in moles, especially if there is a change in a mole's shape or color.  Also tell your health care provider if you have a mole that is larger than the size of a pencil eraser.  Always use sunscreen. Apply sunscreen liberally and repeatedly throughout the day.  Protect yourself by wearing long sleeves, pants, a wide-brimmed hat, and sunglasses whenever you are outside. Heart disease, diabetes, and high blood pressure  High blood pressure causes heart disease and increases the risk of stroke. High blood pressure is more likely to develop in:  People who have blood pressure in the high end of the normal range (130-139/85-89 mm Hg).  People who are overweight or obese.  People who are African  American.  If you are 28-46 years of age, have your blood pressure checked every 3-5 years. If you are 9 years of age or older, have your blood pressure checked every year. You should have your blood pressure measured twice-once when you are at a hospital or clinic, and once when you are not at a hospital or clinic. Record the average of the two measurements. To check your blood pressure when you are not at a hospital or clinic, you can use:  An automated blood pressure machine at a pharmacy.  A home blood pressure monitor.  If you are between 78 years and 52 years old, ask your health care provider if you should take aspirin to prevent strokes.  Have regular diabetes screenings. This involves taking a blood sample to check your fasting blood sugar level.  If you are at a normal weight and have a  low risk for diabetes, have this test once every three years after 70 years of age.  If you are overweight and have a high risk for diabetes, consider being tested at a younger age or more often. Preventing infection Hepatitis B  If you have a higher risk for hepatitis B, you should be screened for this virus. You are considered at high risk for hepatitis B if:  You were born in a country where hepatitis B is common. Ask your health care provider which countries are considered high risk.  Your parents were born in a high-risk country, and you have not been immunized against hepatitis B (hepatitis B vaccine).  You have HIV or AIDS.  You use needles to inject street drugs.  You live with someone who has hepatitis B.  You have had sex with someone who has hepatitis B.  You get hemodialysis treatment.  You take certain medicines for conditions, including cancer, organ transplantation, and autoimmune conditions. Hepatitis C  Blood testing is recommended for:  Everyone born from [birth year] through 1964.  Anyone with known risk factors for hepatitis C. Sexually transmitted infections  (STIs)  You should be screened for sexually transmitted infections (STIs) including gonorrhea and chlamydia if:  You are sexually active and are younger than 70 years of age.  You are older than 70 years of age and your health care provider tells you that you are at risk for this type of infection.  Your sexual activity has changed since you were last screened and you are at an increased risk for chlamydia or gonorrhea. Ask your health care provider if you are at risk.  If you do not have HIV, but are at risk, it may be recommended that you take a prescription medicine daily to prevent HIV infection. This is called pre-exposure prophylaxis (PrEP). You are considered at risk if:  You are sexually active and do not regularly use condoms or know the HIV status of your partner(s).  You take drugs by injection.  You are sexually active with a partner who has HIV. Talk with your health care provider about whether you are at high risk of being infected with HIV. If you choose to begin PrEP, you should first be tested for HIV. You should then be tested every 3 months for as long as you are taking PrEP. Pregnancy  If you are premenopausal and you may become pregnant, ask your health care provider about preconception counseling.  If you may become pregnant, take 400 to 800 micrograms (mcg) of folic acid every day.  If you want to prevent pregnancy, talk to your health care provider about birth control (contraception). Osteoporosis and menopause  Osteoporosis is a disease in which the bones lose minerals and strength with aging. This can result in serious bone fractures. Your risk for osteoporosis can be identified using a bone density scan.  If you are 80 years of age or older, or if you are at risk for osteoporosis and fractures, ask your health care provider if you should be screened.  Ask your health care provider whether you should take a calcium or vitamin D supplement to lower your risk  for osteoporosis.  Menopause may have certain physical symptoms and risks.  Hormone replacement therapy may reduce some of these symptoms and risks. Talk to your health care provider about whether hormone replacement therapy is right for you. Follow these instructions at home:  Schedule regular health, dental, and eye exams.  Stay current with your immunizations.  Do not use any tobacco products including cigarettes, chewing tobacco, or electronic cigarettes.  If you are pregnant, do not drink alcohol.  If you are breastfeeding, limit how much and how often you drink alcohol.  Limit alcohol intake to no more than 1 drink per day for nonpregnant women. One drink equals 12 ounces of beer, 5 ounces of wine, or 1 ounces of hard liquor.  Do not use street drugs.  Do not share needles.  Ask your health care provider for help if you need support or information about quitting drugs.  Tell your health care provider if you often feel depressed.  Tell your health care provider if you have ever been abused or do not feel safe at home. This information is not intended to replace advice given to you by your health care provider. Make sure you discuss any questions you have with your health care provider. Document Released: 07/25/2010 Document Revised: 06/17/2015 Document Reviewed: 10/13/2014  2017 [employer]

## 2016-02-25 NOTE — Progress Notes (Addendum)
Subjective:   Cathy Meyers is a 71 y.o. female who presents for an Initial Medicare Annual Wellness Visit.  The Patient was informed that the wellness visit is to identify future health risk and educate and initiate measures that can reduce risk for increased disease through the lifespan.    NO ROS; Medicare Wellness Visit Was adopted so does not know family hx   Spouse of 7 years has DM  2 children and her spouse has one  Just got back from cruise   Describes health as good, fair or great? Good  What would make it better? 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 Fabian SharpPanosh   Preventive Screening -Counseling & Management   Current smoking/ tobacco status/ Former smoker but very remote use when is school  Second Hand Smoke status; No Smokers in the home spouse does not smoke   ETOH:  Socially but minimal   RISK FACTORS Regular exercise  Exercise Monday- wed and Friday  and listens to [employer]emporary jazz  Walks at job approx  40 hours a month   Diet Does like vegetables; Breakfast; doesn't eat to much; eats around 10 or 11 and her and spouse go out - BLT; [place] sausage Lunch; may not be hungry Dinner; frozen dinner  Eats fruit between meals   Fall risk; no Mobility of Functional changes this year? No issues  Safety; community, one level home wears sunscreen, no;  safe place for firearms if they exist;  Motor vehicle accidents; no  Cardiac Risk Factors:  Advanced aged  35>65 in women Hyperlipidemia- chol 193; HDL 60; LDL 121; Trig 59  Diabetes - 94  Family History does not know due to adoption Obesity 32;   Depression Screen PhQ 2: negative  Activities of Daily Living - See functional screen   Hearing Difficulty: no  Cognitive testing; Ad8 score; 0 or less than 2  MMSE deferred or completed if AD8 + 2 issues Ad8 score reviewed for issues:  Issues making decisions:  Less  interest in hobbies / activities:  Repeats questions, stories (family complaining):  Trouble using ordinary gadgets (microwave, computer, phone):  Forgets the month or year:   Mismanaging finances:   Remembering appts:  Daily problems with thinking and/or memory: Ad8 score is= 0     Patient Care Team: Madelin HeadingsWanda K Panosh, MD as PCP - General Meryl DareMalcolm T Stark, MD (Gastroenterology)   Immunization History  Administered Date(s) Administered  . Influenza Split 11/21/2011, 10/24/2013  . Influenza, High Dose Seasonal PF 09/24/2015  . Influenza,inj,Quad PF,36+ Mos 10/25/2012, 10/09/2014  . Pneumococcal Conjugate-13 10/08/2013  . Pneumococcal Polysaccharide-23 08/14/2012  . Td 01/24/2004  . Zoster 05/02/2011   Required Immunizations needed today  Screening test up to date or reviewed for plan of completion Health Maintenance Due  Topic Date Due  . Hepatitis C Screening  [birth year]   Medicare now request all "baby boomers" test for possible exposure to Hepatitis C. Many may have been exposed due to dental work, tatoo's, vaccinations when young. The Hepatitis C virus is dormant for many years and then sometimes will cause liver cancer. If you gave blood in the past 15 years, you were most likely checked for Hep C. If you rec'd blood; you may want to consider testing or if you are high risk for any other reason.   A Tetanus is recommended every 10 years. Medicare covers  a tetanus if you have a cut or wound; otherwise, there may be a charge. If you had not had a tetanus with pertusses, known as the Tdap, you can take this anytime.   Preventive health Colonoscopy 03/2017 due Mammogram due this year; August 2018; the breast center  Dexa 07/2012 report states normal at all sites  Vertebrae -1.3 per report  PAP 04/2010   Cardiac Risk Factors include: advanced age (>67men, >2 women)     Objective:    Today's Vitals   02/25/16 1501  BP: 118/80  Pulse: 74  SpO2: 95%  Weight:  191 lb (86.6 kg)  Height: 5\' 4"  (1.626 m)   Body mass index is 32.79 kg/m.   Current Medications (verified) Outpatient Encounter Prescriptions as of 02/25/2016  Medication Sig  . Cholecalciferol (VITAMIN D) 1000 UNITS capsule Take 1,000 Units by mouth daily.    . fluticasone (FLONASE) 50 MCG/ACT nasal spray PLACE 2 SPRAYS INTO BOTH NOSTRILS DAILY.  . Multiple Vitamin (MULTIVITAMIN) tablet Take 1 tablet by mouth daily.     No facility-administered encounter medications on file as of 02/25/2016.     Allergies (verified) Penicillins   History: Past Medical History:  Diagnosis Date  . Allergy   . Anemia   . Gastritis    EGD neg bx 2011   . Hiatal hernia    small on egd  . History of varicella   . Hx: UTI (urinary tract infection)    pyelo  . Hyperlipidemia   . Pyelonephritis 06/04/2010  . Sickle cell trait (HCC)    No past surgical history on file. Family History  Problem Relation Age of Onset  . Adopted: Yes  . Hypertension Mother     family hx  . Heart failure Mother    Social History   Occupational History  . Not on file.   Social History Main Topics  . Smoking status: Former Games developer  . Smokeless tobacco: Never Used     Comment: smoked in school periodically   . Alcohol use Yes     Comment: socially  . Drug use: No  . Sexual activity: Not on file    Tobacco Counseling Counseling given: Yes   Activities of Daily Living In your present state of health, do you have any difficulty performing the following activities: 02/25/2016  Hearing? N  Vision? N  Difficulty concentrating or making decisions? N  Walking or climbing stairs? N  Dressing or bathing? N  Doing errands, shopping? N  Preparing Food and eating ? N  Using the Toilet? N  In the past six months, have you accidently leaked urine? N  Do you have problems with loss of bowel control? N  Managing your Medications? N  Managing your Finances? N  Housekeeping or managing your Housekeeping? N  Some  recent data might be hidden    Immunizations and Health Maintenance Immunization History  Administered Date(s) Administered  . Influenza Split 11/21/2011, 10/24/2013  . Influenza, High Dose Seasonal PF 09/24/2015  . Influenza,inj,Quad PF,36+ Mos 10/25/2012, 10/09/2014  . Pneumococcal Conjugate-13 10/08/2013  . Pneumococcal Polysaccharide-23 08/14/2012  . Td 01/24/2004  . Zoster 05/02/2011   Health Maintenance Due  Topic Date Due  . Hepatitis C Screening  1945-04-28    Patient Care Team: Madelin Headings, MD as PCP - General Meryl Dare, MD (Gastroenterology)  Indicate any recent Medical Services you may have received from other than [facility] providers in the past year (date may be approximate).  Assessment:   This is a routine wellness examination for [place].    Hearing/Vision screen  Hearing Screening   125Hz  250Hz  500Hz  1000Hz  2000Hz  3000Hz  4000Hz  6000Hz  8000Hz   Right ear:       100    Left ear:       100    Comments: No hearing issues   Vision Screening Comments: Last vision check was 4 years ago Will schedule another one   Dietary issues and exercise activities discussed: Current Exercise Habits: Home exercise routine, Type of exercise: walking, Time (Minutes): > 60, Frequency (Times/Week): 4, Weekly Exercise (Minutes/Week): 0, Intensity: Mild  Goals      Weight   . Weight (lb) < 165 lb (74.8 kg)          Portion control;  Try to keep sodium around 2500mg  per day   Check out  online nutrition programs as WikiBlast.com.cy and LimitLaws.com.cy; fit54me; Look for foods with "whole" wheat; bran; oatmeal etc Shot at the farmer's markets in season for fresher choices  Watch for "hydrogenated" on the label of oils which are trans-fats.  Watch for "high fructose corn syrup" in snacks, yogurt or ketchup  Meats have less marbling; bright colored fruits and vegetables;  Canned; dump out liquid and wash vegetables. Be mindful of what we are eating  Portion control is  essential to a health weight! Sit down; take a break and enjoy your meal; take smaller bites; put the fork down between bites;  It takes 20 minutes to get full; so check in with your fullness cues and stop eating when you start to fill full             Depression Screen PHQ 2/9 Scores 02/25/2016 03/02/2015 10/08/2013 08/14/2012  PHQ - 2 Score 0 0 0 0    Fall Risk Fall Risk  02/25/2016 03/02/2015 10/08/2013 08/14/2012  Falls in the past year? No No No No    Cognitive Function:  Ad8 score is 0       Screening Tests Health Maintenance  Topic Date Due  . Hepatitis C Screening  05/21/1945  . TETANUS/TDAP  02/23/2017 (Originally 01/23/2014)  . COLONOSCOPY  04/10/2017  . MAMMOGRAM  09/08/2017  . INFLUENZA VACCINE  Completed  . DEXA SCAN  Completed  . ZOSTAVAX  Completed  . PNA vac Low Risk Adult  Completed      Plan:   1. Expresses desire for weight loss  Has lost weight in the past and has historically gained when returning from a cruise  Weight 140 at retirement approx 71 yo  To lose weight; she Starts watching portions; watching calories  Educated regarding sodium as well Given strategies for weight loss   2. Will hold TDAP today but will check with pharmacy on cost and may may take if affordable   3. Will discuss dexa repeat with Dr. Fabian Sharp;  T-score -1.3  Educated regarding 1200 calcium and 800 of vitamin d if she is not in the sun .  During the course of the visit, [place] was educated and counseled about the following appropriate screening and preventive services:   Vaccines to include Pneumoccal, Influenza, Hepatitis B, Td, Zostavax, HCV  Electrocardiogram  Cardiovascular disease screening neg  Colorectal cancer screening  Due 03/2017  Bone density screening 2014; may repeat 5 years; was told it was normal but report vertebra -1.3   Diabetes screening neg  Glaucoma screening will make an apt for eye exam   Mammography/ to be repeated around August  No more  pap test   Nutrition counseling; spent 15 minutes discussing option and motivation  Smoking cessation counseling neg  Patient Instructions (the written plan) were given to the patient.    Lorretta Harp, MD   02/26/2016     reviewed  And agree with above. Usually do dexa in 3-5 years unless high risk . Lorretta Harp, MD

## 2016-02-26 LAB — HEPATITIS C ANTIBODY: HCV Ab: NEGATIVE

## 2016-02-28 NOTE — Progress Notes (Signed)
Pre visit review using our clinic review tool, if applicable. No additional management support is needed unless otherwise documented below in the visit note.  Chief Complaint  Patient presents with  . Medicare Wellness    HPI: Patient  Cathy Meyers  71 y.o. comes in today for Preventive Health Care visit   Had wellness exam via [facility] feb 2nd  No major injuries this year or change in health status. She does the older she's walking up steps. Continued Flonase for her nasal allergies. Due for colon cancer screening and bone density next year. Doesn't know her family history been no history of polyps or cancer. Hx reflux  Elevated  . HOB but doing ok  Sorer than usually .  Last dexa -1.3  Does weight bearing exercise   Health Maintenance  Topic Date Due  . TETANUS/TDAP  02/23/2017 (Originally 01/23/2014)  . COLONOSCOPY  04/10/2017  . MAMMOGRAM  09/08/2017  . INFLUENZA VACCINE  Completed  . DEXA SCAN  Completed  . ZOSTAVAX  Completed  . Hepatitis C Screening  Completed  . PNA vac Low Risk Adult  Completed   Health Maintenance Review LIFESTYLE:  Exercise:   Tobacco/ETS: Alcohol:  seldon Sugar beverages:   Green tea   .  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     ROS:  GEN/ HEENT: No fever, significant weight changes sweats headaches vision problems hearing changes, CV/ PULM; No chest pain shortness of breath cough, syncope,edema  change in exercise tolerance. GI /GU: No adominal pain, vomiting, change in bowel habits. No blood in the stool. No significant GU symptoms. SKIN/HEME: ,no acute skin rashes suspicious lesions or bleeding. No lymphadenopathy, nodules, masses.  NEURO/ PSYCH:  No neurologic signs such as weakness numbness. No depression anxiety. IMM/ Allergy: No unusual infections.  Allergy .   REST of 12 system review negative except as per HPI   Past Medical History:  Diagnosis Date  . Allergy   . Anemia   .  Gastritis    EGD neg bx 2011   . Hiatal hernia    small on egd  . History of varicella   . Hx: UTI (urinary tract infection)    pyelo  . Hyperlipidemia   . Pyelonephritis 06/04/2010  . Sickle cell trait ([place])     No past surgical history on file.  Family History  Problem Relation Age of Onset  . Adopted: Yes  . Hypertension Mother     family hx  . Heart failure Mother     Social History   Social History  . Marital status: Married    Spouse name: N/A  . Number of children: N/A  . Years of education: N/A   Social History Main Topics  . Smoking status: Former Research scientist (life sciences)  . Smokeless tobacco: Never Used     Comment: smoked in school periodically   . Alcohol use Yes     Comment: socially  . Drug use: No  . Sexual activity: Not Asked   Other Topics Concern  . 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 Cholecalciferol (VITAMIN D) 1000 UNITS capsule Take 1,000 Units by mouth daily.      . fluticasone (FLONASE) 50 MCG/ACT nasal spray PLACE 2 SPRAYS INTO BOTH NOSTRILS DAILY. 16 g 5  . Multiple Vitamin (MULTIVITAMIN) tablet Take 1 tablet by mouth daily.       No facility-administered medications prior to visit.      EXAM:  BP 114/70 (BP Location: Right Arm, Patient Position: Sitting, Cuff Size: Normal)   Temp 97.9 F (36.6 C) (Oral)   Ht '5\' 3"'$  (1.6 m)   Wt 192 lb (87.1 kg)   BMI 34.01 kg/m   Body mass index is 34.01 kg/m. Wt Readings from Last 3 Encounters:  03/01/16 192 lb (87.1 kg)  02/25/16 191 lb (86.6 kg)  03/02/15 191 lb 3.2 oz (86.7 kg)    Physical Exam: Vital signs reviewed [id number] is a well-developed well-nourished alert cooperative    who appearsr stated age in no acute distress.    HEENT: normocephalic atraumatic , Eyes: PERRL EOM's full, conjunctiva clear, Nares: paten,t no deformity discharge or tenderness., Ears: no deformity EAC's clear TMs with normal landmarks. Mouth: clear OP, no lesions, edema.  Moist mucous membranes. Dentition in adequate repair. NECK: supple without masses, thyromegaly or bruits. CHEST/PULM:  Clear to auscultation and percussion breath sounds equal no wheeze , rales or rhonchi. No chest wall deformities or tenderness. Breast: normal by inspection . No dimpling, discharge, masses, tenderness or discharge . CV: PMI is nondisplaced, S1 S2 no gallops, murmurs, rubs. Peripheral pulses are full without delay.No JVD .  ABDOMEN: Bowel sounds normal nontender  No guard or rebound, no hepato splenomegal no CVA tenderness.  No hernia. Extremtities:  No clubbing cyanosis or edema, no acute joint swelling or redness no focal atrophy NEURO:  Oriented x3, cranial nerves 3-12 appear to be intact, no obvious focal weakness,gait within normal limits no abnormal reflexes or asymmetrical SKIN: No acute rashes normal turgor, color, no bruising or petechiae. PSYCH: Oriented, good eye contact, no obvious depression anxiety, cognition and judgment appear normal. LN: no cervical axillary inguinal adenopathy  Lab Results  Component Value Date   WBC 7.2 02/23/2016   HGB 12.6 02/23/2016   HCT 37.6 02/23/2016   PLT 294.0 02/23/2016   GLUCOSE 94 02/23/2016   CHOL 193 02/23/2016   TRIG 58.0 02/23/2016   HDL 60.30 02/23/2016   LDLDIRECT 122.4 04/25/2011   LDLCALC 121 (H) 02/23/2016   ALT 10 02/23/2016   AST 14 02/23/2016   NA 142 02/23/2016   K 4.4 02/23/2016   CL 108 02/23/2016   CREATININE 0.87 02/23/2016   BUN 12 02/23/2016   CO2 27 02/23/2016   TSH 3.21 02/23/2016    BP Readings from Last 3 Encounters:  03/01/16 114/70  02/25/16 118/80  03/02/15 130/84    Lab results reviewed with patient   ASSESSMENT AND PLAN:  Discussed the following assessment  and plan:  Visit for preventive health examination  Allergic rhinitis, unspecified chronicity, unspecified seasonality, unspecified trigger  H/O bone density study - -1.3   2014 Reviewed findings healthy eating lifestyle to help with weight consideration of yogurt tai chi for balance and musculoskeletal health. Is a good candidate for this. Discussed option of cologuard next year she is not elevated risk as opposed to colonoscopy choices. Also discussed husband's difficulty medical problems diabetes frozen shoulder etc. Try to get more sleep. Patient Care Team: Burnis Medin, MD as  PCP - General Ladene Artist, MD (Gastroenterology) Patient Instructions   Continue lifestyle intervention healthy eating and exercise . Try yoga or tai chi for balance  maintance   Blood work is good .  Stable   Healthy lifestyle includes : At least 150 minutes of exercise weeks  , weight at healthy levels, which is usually   BMI 19-27 Avoid trans fats and processed foods;  Increase fresh fruits and veges to 5 servings per day. And avoid sweet beverages including tea and juice. Mediterranean diet with olive oil and nuts have been noted to be heart and brain healthy . Avoid tobacco products . Limit  alcohol to  7 per week for women and 14 servings for men.  Get adequate sleep . Wear seat belts . Don't text and drive .    Wt Readings from Last 3 Encounters:  03/01/16 192 lb (87.1 kg)  02/25/16 191 lb (86.6 kg)  03/02/15 191 lb 3.2 oz (86.7 kg)    BP Readings from Last 3 Encounters:  03/01/16 114/70  02/25/16 118/80  03/02/15 130/84       Wanda K. Panosh M.D. Reviewed wellness documentationh agree.

## 2016-03-01 ENCOUNTER — Ambulatory Visit (INDEPENDENT_AMBULATORY_CARE_PROVIDER_SITE_OTHER): Payer: PPO | Admitting: Internal Medicine

## 2016-03-01 ENCOUNTER — Encounter: Payer: Self-pay | Admitting: Internal Medicine

## 2016-03-01 VITALS — BP 114/70 | Temp 97.9°F | Ht 63.0 in | Wt 192.0 lb

## 2016-03-01 DIAGNOSIS — Z0000 Encounter for general adult medical examination without abnormal findings: Secondary | ICD-10-CM

## 2016-03-01 DIAGNOSIS — Z9289 Personal history of other medical treatment: Secondary | ICD-10-CM | POA: Diagnosis not present

## 2016-03-01 DIAGNOSIS — J309 Allergic rhinitis, unspecified: Secondary | ICD-10-CM | POA: Diagnosis not present

## 2016-03-01 NOTE — Patient Instructions (Addendum)
Continue lifestyle intervention healthy eating and exercise . Try yoga or tai chi for balance  maintance   Blood work is good .  Stable   Healthy lifestyle includes : At least 150 minutes of exercise weeks  , weight at healthy levels, which is usually   BMI 19-27 Avoid trans fats and processed foods;  Increase fresh fruits and veges to 5 servings per day. And avoid sweet beverages including tea and juice. Mediterranean diet with olive oil and nuts have been noted to be heart and brain healthy . Avoid tobacco products . Limit  alcohol to  7 per week for women and 14 servings for men.  Get adequate sleep . Wear seat belts . Don't text and drive .    Wt Readings from Last 3 Encounters:  03/01/16 192 lb (87.1 kg)  02/25/16 191 lb (86.6 kg)  03/02/15 191 lb 3.2 oz (86.7 kg)    BP Readings from Last 3 Encounters:  03/01/16 114/70  02/25/16 118/80  03/02/15 130/84

## 2016-04-13 ENCOUNTER — Other Ambulatory Visit: Payer: Self-pay | Admitting: Emergency Medicine

## 2016-04-13 MED ORDER — FLUTICASONE PROPIONATE 50 MCG/ACT NA SUSP: NASAL | 5 refills | Status: DC

## 2016-05-15 ENCOUNTER — Ambulatory Visit (INDEPENDENT_AMBULATORY_CARE_PROVIDER_SITE_OTHER): Payer: PPO | Admitting: Internal Medicine

## 2016-05-15 ENCOUNTER — Encounter: Payer: Self-pay | Admitting: Internal Medicine

## 2016-05-15 VITALS — BP 110/70 | HR 73 | Temp 97.8°F | Ht 66.0 in | Wt 189.4 lb

## 2016-05-15 DIAGNOSIS — R399 Unspecified symptoms and signs involving the genitourinary system: Secondary | ICD-10-CM | POA: Diagnosis not present

## 2016-05-15 DIAGNOSIS — R350 Frequency of micturition: Secondary | ICD-10-CM

## 2016-05-15 LAB — POC URINALSYSI DIPSTICK (AUTOMATED)
Bilirubin, UA: NEGATIVE
Glucose, UA: NEGATIVE
KETONES UA: NEGATIVE
Nitrite, UA: NEGATIVE
PH UA: 6 (ref 5.0–8.0)
PROTEIN UA: NEGATIVE
RBC UA: NEGATIVE
Spec Grav, UA: 1.02 (ref 1.010–1.025)
Urobilinogen, UA: 0.2 E.U./dL

## 2016-05-15 MED ORDER — NITROFURANTOIN MONOHYD MACRO 100 MG PO CAPS: 100.0000 mg | ORAL_CAPSULE | Freq: Two times a day (BID) | ORAL | 0 refills | Status: AC

## 2016-05-15 NOTE — Addendum Note (Signed)
Addended by: Charna Elizabeth on: 05/15/2016 01:47 PM   Modules accepted: Orders

## 2016-05-15 NOTE — Patient Instructions (Signed)
Treating you for a bladder infection UTI. We'll contact you when urine culture results are back. If you're having persistent symptoms after treatment and culture back contact us for advice.

## 2016-05-15 NOTE — Progress Notes (Signed)
Chief Complaint  Patient presents with  . Urinary Frequency    light bleeding in urine (only once), itching an pain in groin area/ started two weeks ago     HPI: Cathy Meyers 71 y.o.  With hx of ? Blood when wipe after urinating?  And tingling itchy at area . Onset 3 weeks ago had straining bm and had blood when wiped pink  And then went away  But not at rectum .  Then 3 days ago  againg  And  Pink red area was at the urethral area and not rectal  Has had some frequency and  Lower abd  urgnecy  No fever flank pain  Feels a bit like uti  No vaginal bleeding or itching   No fever chills  Husband to have shoulder surgery tomorrow ROS: See pertinent positives and negatives per HPI. No hx renal stones ocass constipation    Past Medical History:  Diagnosis Date  . Allergy   . Anemia   . Gastritis    EGD neg bx 2011   . Hiatal hernia    small on egd  . History of varicella   . Hx: UTI (urinary tract infection)    pyelo  . Hyperlipidemia   . Pyelonephritis 06/04/2010  . Sickle cell trait (HCC)     Family History  Problem Relation Age of Onset  . Adopted: Yes  . Hypertension Mother     family hx  . Heart failure Mother     Social History   Social History  . Marital status: Married    Spouse name: N/A  . Number of children: N/A  . Years of education: N/A   Social History Main Topics  . Smoking status: Former Games developer  . Smokeless tobacco: Never Used     Comment: smoked in school periodically   . Alcohol use Yes     Comment: socially  . Drug use: No  . Sexual activity: Not Asked   Other Topics Concern  . 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 Cholecalciferol (VITAMIN D) 1000 UNITS capsule  Take 1,000 Units by mouth daily.      . fluticasone (FLONASE) 50 MCG/ACT nasal spray PLACE 2 SPRAYS INTO BOTH NOSTRILS DAILY. 16 g 5  . Multiple Vitamin (MULTIVITAMIN) tablet Take 1 tablet by mouth daily.       No facility-administered medications prior to visit.      EXAM:  BP 110/70 (BP Location: Left Arm, Patient Position: Sitting, Cuff Size: Normal)   Pulse 73   Temp 97.8 F (36.6 C) (Oral)   Ht  (1.676 m)   Wt 189 lb 6.4 oz (85.9 kg)   BMI 30.57 kg/m   Body mass index is 30.57 kg/m.  GENERAL: vitals reviewed and listed above, alert, oriented, appears well hydrated and in no acute distress HEENT: atraumatic, conjunctiva  clear, no obvious abnormalities on inspection of external nose and ears Abdomen:  Sof,t normal bowel sounds without hepatosplenomegaly, no guarding rebound or masses no CVA tenderness MS: moves all extremities without noticeable focal  abnormality PSYCH:  pleasant and cooperative, no obvious depression or anxiety UA 1 leuk neg  Blood   ASSESSMENT AND PLAN:  Discussed the following assessment and plan:  Lower urinary tract symptoms (LUTS) - Plan: POCT Urinalysis Dipstick (Automated), Urine culture  Urinary frequency Poss uti with blood but  aty[ipical  presentation   Pt is sure not  Rectal blood seen  Nor vaginal  rx for uti pending cx and fu if  persistent or progressive    Expectant management.  -Patient advised to return or notify health care team  if symptoms worsen ,persist or new concerns arise.  Patient Instructions  Treating you for a bladder infection UTI. We'll contact you when urine culture results are back. If you're having persistent symptoms after treatment and culture back contact us for advice.     Neta Mends. Karstyn Birkey M.D.

## 2016-05-16 LAB — URINE CULTURE: ORGANISM ID, BACTERIA: NO GROWTH

## 2016-05-18 ENCOUNTER — Encounter: Payer: Self-pay | Admitting: Gastroenterology

## 2016-05-18 ENCOUNTER — Encounter: Payer: Self-pay | Admitting: Internal Medicine

## 2016-05-18 ENCOUNTER — Ambulatory Visit (INDEPENDENT_AMBULATORY_CARE_PROVIDER_SITE_OTHER): Payer: PPO | Admitting: Internal Medicine

## 2016-05-18 VITALS — BP 122/70 | HR 68 | Temp 98.3°F | Ht 66.0 in | Wt 190.2 lb

## 2016-05-18 DIAGNOSIS — K602 Anal fissure, unspecified: Secondary | ICD-10-CM

## 2016-05-18 DIAGNOSIS — R58 Hemorrhage, not elsewhere classified: Secondary | ICD-10-CM

## 2016-05-18 NOTE — Patient Instructions (Signed)
You may have a superficial tear vs fissure  around the anal rectal area. I don't see any masses. But this could be for the blood is coming from although usually it is painful. I don't see any lesion in the vagina at this time. We'll make a referral to GI for their opinion and help. If you're getting bleeding that really looks vaginal in the underwear etc. contact us and we'll will get gynecology to see you also.

## 2016-05-18 NOTE — Progress Notes (Signed)
Chief Complaint  Patient presents with  . Vaginal Bleeding    slight light bleeding     HPI: Cathy Meyers 72 y.o.  See above      See last note  Thinks blood from vagina  Recurred .  Only after a bowel movement and pink area on tissue    No pain  No uti at this point  No sig vaginitis sx. No abd pain   And Bm seems nl with straining and other  See last notes  And UCX NG  No SA at this time  hhusband  Ill ness  ROS: See pertinent positives and negatives per HPI.no fever    Past Medical History:  Diagnosis Date  . Allergy   . Anemia   . Gastritis    EGD neg bx 2011   . Hiatal hernia    small on egd  . History of varicella   . Hx: UTI (urinary tract infection)    pyelo  . Hyperlipidemia   . Pyelonephritis 06/04/2010  . Sickle cell trait (HCC)     Family History  Problem Relation Age of Onset  . Adopted: Yes  . Hypertension Mother     family hx  . Heart failure Mother     Social History   Social History  . Marital status: Married    Spouse name: N/A  . Number of children: N/A  . Years of education: N/A   Social History Main Topics  . Smoking status: Former Games developer  . Smokeless tobacco: Never Used     Comment: smoked in school periodically   . Alcohol use Yes     Comment: socially  . Drug use: No  . Sexual activity: Not on file   Other Topics Concern  . 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 CALCIUM PO Take by mouth.    . Cholecalciferol (VITAMIN D) 1000 UNITS capsule Take 1,000 Units by mouth daily.      . fluticasone (FLONASE) 50 MCG/ACT nasal spray PLACE 2 SPRAYS INTO BOTH NOSTRILS DAILY. 16 g 5  . Multiple Vitamin (MULTIVITAMIN) tablet Take 1 tablet by mouth daily.      .  nitrofurantoin, macrocrystal-monohydrate, (MACROBID) 100 MG capsule Take 1 capsule (100 mg total) by mouth 2 (two) times daily. 14 capsule 0   No facility-administered medications prior to visit.      EXAM:  BP 122/70 (BP Location: Right Arm, Patient Position: Sitting, Cuff Size: Normal)   Pulse 68   Temp 98.3 F (36.8 C) (Oral)   Ht  (1.676 m)   Wt 190 lb 3.2 oz (86.3 kg)   BMI 30.70 kg/m   Body mass index is 30.7 kg/m.  GENERAL: vitals reviewed and listed above, alert, oriented, appears well hydrated and in no acute distress HEENT: atraumatic, conjunctiva  clear, no obvious abnormalities on inspection of external nose and ears PSYCH: pleasant and cooperative, no obvious depression or anxiety Ext gu   Perineum lateral 7 pm a superficial pink area ? fissure non tender and no blood    Vaginal  Atrophic vag mucosa asome erythema  But no lesion noted  Rectal exam no stool no blood on glove.   Pt wiped after  Exam and noted light pink   On wiping.  ASSESSMENT AND PLAN:  Discussed the following assessment and plan:  Bleeding - ? vag vs perineal other  - Plan: Ambulatory referral to Gynecology, Ambulatory referral to Gastroenterology  Anal fissure ? - Plan: Ambulatory referral to Gastroenterology Last pap 2012 and negative  I am not sure where this bleeding is coming from I suppose the vaginal exam could've caused some atrophic vaginitis bleeding but she does have what looks like a superficial lesion near the rectal area but no blood or pain at that point. Plan get GI and GYN exam to rule out significant disease and help with cause of the symptoms..   -Patient advised to return or notify health care team  if symptoms worsen ,persist or new concerns arise.  Patient Instructions  You may have a superficial tear vs fissure  around the anal rectal area. I don't see any masses. But this could be for the blood is coming from although usually it is painful. I don't see any lesion in the  vagina at this time. We'll make a referral to GI for their opinion and help. If you're getting bleeding that really looks vaginal in the underwear etc. contact us and we'll will get gynecology to see you also.     Cathy Meyers. Cathy Meyers M.D.

## 2016-05-25 DIAGNOSIS — N950 Postmenopausal bleeding: Secondary | ICD-10-CM | POA: Diagnosis not present

## 2016-05-25 LAB — HM PAP SMEAR

## 2016-06-22 ENCOUNTER — Ambulatory Visit: Payer: PPO | Admitting: Gastroenterology

## 2016-06-22 ENCOUNTER — Other Ambulatory Visit: Payer: Self-pay | Admitting: Internal Medicine

## 2016-06-23 DIAGNOSIS — R87615 Unsatisfactory cytologic smear of cervix: Secondary | ICD-10-CM | POA: Diagnosis not present

## 2016-06-23 DIAGNOSIS — N950 Postmenopausal bleeding: Secondary | ICD-10-CM | POA: Diagnosis not present

## 2016-06-23 HISTORY — PX: DILATION AND CURETTAGE OF UTERUS: SHX78

## 2016-06-27 DIAGNOSIS — N950 Postmenopausal bleeding: Secondary | ICD-10-CM | POA: Diagnosis not present

## 2016-07-12 ENCOUNTER — Encounter: Payer: Self-pay | Admitting: Family Medicine

## 2016-07-17 ENCOUNTER — Encounter (INDEPENDENT_AMBULATORY_CARE_PROVIDER_SITE_OTHER): Payer: Self-pay

## 2016-07-17 ENCOUNTER — Encounter: Payer: Self-pay | Admitting: Gastroenterology

## 2016-07-17 ENCOUNTER — Ambulatory Visit (INDEPENDENT_AMBULATORY_CARE_PROVIDER_SITE_OTHER): Payer: PPO | Admitting: Gastroenterology

## 2016-07-17 VITALS — BP 108/76 | HR 72 | Ht 62.5 in | Wt 186.2 lb

## 2016-07-17 DIAGNOSIS — K5904 Chronic idiopathic constipation: Secondary | ICD-10-CM | POA: Diagnosis not present

## 2016-07-17 DIAGNOSIS — K921 Melena: Secondary | ICD-10-CM | POA: Diagnosis not present

## 2016-07-17 MED ORDER — NA SULFATE-K SULFATE-MG SULF 17.5-3.13-1.6 GM/177ML PO SOLN: 1.0000 | Freq: Once | ORAL | 0 refills | Status: AC

## 2016-07-17 NOTE — Patient Instructions (Signed)
You have been scheduled for a colonoscopy. Please follow written instructions given to you at your visit today.  Please pick up your prep supplies at the pharmacy within the next 1-3 days. If you use inhalers (even only as needed), please bring them with you on the day of your procedure. Your physician has requested that you go to www.startemmi.com and enter the access code given to you at your visit today. This web site gives a general overview about your procedure. However, you should still follow specific instructions given to you by our office regarding your preparation for the procedure.  Thank you for choosing me and [facility].  Malcolm T. Stark, Jr., MD., FACG  

## 2016-07-17 NOTE — Progress Notes (Signed)
History of Present Illness: This is a 71 year old female referred by Panosh, Cathy MendsWanda K, MD for the evaluation of constipation and possible rectal bleeding. Patient relates a long history of intermittent problems with constipation and she takes an over-the-counter laxative intermittently. She had straining with a difficult bowel movement in March and noted some bright red blood following the bowel movement. Since then she has had several episodes of either vaginal, perineal or rectal bleeding. She was evaluated by her gynecologist and a D&C was recently performed and she notes no more bleeding since the D&C. Question pink tinge on glove following DRE by Dr. Fabian SharpPanosh. Denies weight loss, abdominal pain, diarrhea, change in stool caliber, melena, nausea, vomiting, dysphagia, reflux symptoms, chest pain.  EGD 2011: moderate gastritis, small HH Colonoscopy 03/2007: normal  Allergies  Allergen Reactions  . Penicillins    Outpatient Medications Prior to Visit  Medication Sig Dispense Refill  . CALCIUM PO Take by mouth.    . Cholecalciferol (VITAMIN D) 1000 UNITS capsule Take 1,000 Units by mouth daily.      . fluticasone (FLONASE) 50 MCG/ACT nasal spray PLACE 2 SPRAYS INTO BOTH NOSTRILS DAILY. 16 g 5  . Multiple Vitamin (MULTIVITAMIN) tablet Take 1 tablet by mouth daily.       No facility-administered medications prior to visit.    Past Medical History:  Diagnosis Date  . Allergy   . Anemia   . Gastritis    EGD neg bx 2011   . Hiatal hernia    small on egd  . History of varicella   . Hx: UTI (urinary tract infection)    pyelo  . Hyperlipidemia   . Pyelonephritis 06/04/2010  . Sickle cell trait (HCC)    History reviewed. No pertinent surgical history. Social History   Social History  . Marital status: Married    Spouse name: N/A  . Number of children: N/A  . Years of education: N/A   Social History Main Topics  . Smoking status: Former Games developermoker  . Smokeless tobacco: Never Used   Comment: smoked in school periodically   . Alcohol use Yes     Comment: socially  . Drug use: No  . Sexual activity: Not Asked   Other Topics Concern  . 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 Adopted: Yes  . Hypertension Mother        family hx  . Heart failure Mother       Review of Systems: Pertinent positive and negative review of systems were noted in the above HPI section. All other review of systems were otherwise negative.   Physical Exam: General: Well developed, well nourished, no acute distress Head: Normocephalic and atraumatic Eyes:  sclerae anicteric, EOMI Ears: Normal auditory acuity Mouth: No deformity or lesions Neck: Supple, no masses or thyromegaly Lungs: Clear throughout to auscultation Heart: Regular rate and rhythm; no murmurs, rubs or bruits Abdomen: Soft, non tender and non distended. No masses, hepatosplenomegaly or hernias noted. Normal Bowel sounds Rectal: deferred to colonoscopy Musculoskeletal: Symmetrical with no gross deformities  Skin: No lesions on visible extremities Pulses:  Normal pulses noted Extremities: No clubbing, cyanosis, edema or deformities noted Neurological: Alert oriented x 4, grossly nonfocal Cervical Nodes:  No significant cervical adenopathy Inguinal Nodes: No significant inguinal adenopathy Psychological:  Alert and cooperative. Normal mood and affect  Assessment and Recommendations:  1.  Hematochezia vs. vaginal bleeding Chronic constipation. Miralax dailly. Schedule colonoscopy. The risks (including bleeding, perforation, infection, missed lesions, medication reactions and possible hospitalization or surgery if complications occur), benefits, and alternatives to colonoscopy  with possible biopsy and possible polypectomy were discussed with the patient and they consent to proceed.    cc: Madelin Headings, MD [address] [place], [place] [address]

## 2016-08-14 ENCOUNTER — Other Ambulatory Visit: Payer: Self-pay | Admitting: Internal Medicine

## 2016-08-14 DIAGNOSIS — Z1231 Encounter for screening mammogram for malignant neoplasm of breast: Secondary | ICD-10-CM

## 2016-08-31 ENCOUNTER — Encounter: Payer: Self-pay | Admitting: Gastroenterology

## 2016-09-12 ENCOUNTER — Ambulatory Visit
Admission: RE | Admit: 2016-09-12 | Discharge: 2016-09-12 | Disposition: A | Discharge status: Home / Self Care | Payer: PPO | Source: Ambulatory Visit | Attending: Internal Medicine | Admitting: Internal Medicine

## 2016-09-12 DIAGNOSIS — Z1231 Encounter for screening mammogram for malignant neoplasm of breast: Secondary | ICD-10-CM | POA: Diagnosis not present

## 2016-09-14 ENCOUNTER — Ambulatory Visit (AMBULATORY_SURGERY_CENTER): Payer: PPO | Admitting: Gastroenterology

## 2016-09-14 ENCOUNTER — Encounter: Payer: Self-pay | Admitting: Gastroenterology

## 2016-09-14 ENCOUNTER — Other Ambulatory Visit: Payer: Self-pay | Admitting: Internal Medicine

## 2016-09-14 VITALS — BP 121/74 | HR 67 | Temp 98.2°F | Resp 11 | Ht 62.5 in | Wt 186.0 lb

## 2016-09-14 DIAGNOSIS — K921 Melena: Secondary | ICD-10-CM

## 2016-09-14 DIAGNOSIS — K648 Other hemorrhoids: Secondary | ICD-10-CM | POA: Diagnosis not present

## 2016-09-14 DIAGNOSIS — R928 Other abnormal and inconclusive findings on diagnostic imaging of breast: Secondary | ICD-10-CM

## 2016-09-14 DIAGNOSIS — K449 Diaphragmatic hernia without obstruction or gangrene: Secondary | ICD-10-CM | POA: Diagnosis not present

## 2016-09-14 DIAGNOSIS — D573 Sickle-cell trait: Secondary | ICD-10-CM | POA: Diagnosis not present

## 2016-09-14 MED ORDER — SODIUM CHLORIDE 0.9 % IV SOLN: 500.0000 mL | INTRAVENOUS | Status: DC

## 2016-09-14 NOTE — Patient Instructions (Signed)
YOU HAD AN ENDOSCOPIC PROCEDURE TODAY AT THE [facility]:   Refer to the procedure report that was given to you for any specific questions about what was found during the examination.  If the procedure report does not answer your questions, please call your gastroenterologist to clarify.  If you requested that your care partner not be given the details of your procedure findings, then the procedure report has been included in a sealed envelope for you to review at your convenience later.  YOU SHOULD EXPECT: Some feelings of bloating in the abdomen. Passage of more gas than usual.  Walking can help get rid of the air that was put into your GI tract during the procedure and reduce the bloating. If you had a lower endoscopy (such as a colonoscopy or flexible sigmoidoscopy) you may notice spotting of blood in your stool or on the toilet paper. If you underwent a bowel prep for your procedure, you may not have a normal bowel movement for a few days.  Please Note:  You might notice some irritation and congestion in your nose or some drainage.  This is from the oxygen used during your procedure.  There is no need for concern and it should clear up in a day or so.  SYMPTOMS TO REPORT IMMEDIATELY:   Following lower endoscopy (colonoscopy or flexible sigmoidoscopy):  Excessive amounts of blood in the stool  Significant tenderness or worsening of abdominal pains  Swelling of the abdomen that is new, acute  Fever of 100F or higher   For urgent or emergent issues, a gastroenterologist can be reached at any hour by calling [phone]. Please read all handouts given to you by your recovery nurse.   DIET:  We do recommend a small meal at first, but then you may proceed to your regular diet.  Drink plenty of fluids but you should avoid alcoholic beverages for 24 hours.  ACTIVITY:  You should plan to take it easy for the rest of today and you should NOT DRIVE or use heavy machinery until  tomorrow (because of the sedation medicines used during the test).    FOLLOW UP: Our staff will call the number listed on your records the next business day following your procedure to check on you and address any questions or concerns that you may have regarding the information given to you following your procedure. If we do not reach you, we will leave a message.  However, if you are feeling well and you are not experiencing any problems, there is no need to return our call.  We will assume that you have returned to your regular daily activities without incident.  If any biopsies were taken you will be contacted by phone or by letter within the next 1-3 weeks.  Please call us at [phone] if you have not heard about the biopsies in 3 weeks.    SIGNATURES/CONFIDENTIALITY: You and/or your care partner have signed paperwork which will be entered into your electronic medical record.  These signatures attest to the fact that that the information above on your After Visit Summary has been reviewed and is understood.  Full responsibility of the confidentiality of this discharge information lies with you and/or your care-partner.  Thank you for letting us take care of your healthcare needs today. 

## 2016-09-14 NOTE — Op Note (Signed)
[facility] Patient Name: Cathy Meyers Procedure Date: 09/14/2016 1:48 PM MRN: [id number] Endoscopist: Meryl Dare , MD Age: 71 Referring MD:  Date of Birth: 01-Jul-1945 Gender: Female Account #: [id number] Procedure:                Colonoscopy Indications:              Hematochezia Medicines:                Monitored Anesthesia Care Procedure:                Pre-Anesthesia Assessment:                           - Prior to the procedure, a History and Physical                            was performed, and patient medications and                            allergies were reviewed. The patient's tolerance of                            previous anesthesia was also reviewed. The risks                            and benefits of the procedure and the sedation                            options and risks were discussed with the patient.                            All questions were answered, and informed consent                            was obtained. Prior Anticoagulants: The patient has                            taken no previous anticoagulant or antiplatelet                            agents. ASA Grade Assessment: II - A patient with                            mild systemic disease. After reviewing the risks                            and benefits, the patient was deemed in                            satisfactory condition to undergo the procedure.                           After obtaining informed consent, the colonoscope  was passed under direct vision. Throughout the                            procedure, the patient's blood pressure, pulse, and                            oxygen saturations were monitored continuously. The                            Model PCF-H190DL [id number]) scope was introduced                            through the anus and advanced to the the cecum,                            identified by appendiceal orifice and ileocecal                             valve. The ileocecal valve, appendiceal orifice,                            and rectum were photographed. The quality of the                            bowel preparation was good. The colonoscopy was                            performed without difficulty. The patient tolerated                            the procedure well. Scope In: 1:53:48 PM Scope Out: 2:03:08 PM Scope Withdrawal Time: 0 hours 8 minutes 8 seconds  Total Procedure Duration: 0 hours 9 minutes 20 seconds  Findings:                 The perianal and digital rectal examinations were                            normal.                           Internal hemorrhoids were found during                            retroflexion. The hemorrhoids were small and Grade                            I (internal hemorrhoids that do not prolapse).                           The exam was otherwise without abnormality on                            direct and retroflexion views. Complications:            No immediate complications.  Estimated blood loss:                            None. Estimated Blood Loss:     Estimated blood loss: none. Impression:               - Small internal hemorrhoids                           - Otherwise without abnormality noted on direct and                            retroflexion views.                           - No specimens collected. Recommendation:           - Repeat colonoscopy in 10 years for screening                            purposes.                           - Patient has a contact number available for                            emergencies. The signs and symptoms of potential                            delayed complications were discussed with the                            patient. Return to normal activities tomorrow.                            Written discharge instructions were provided to the                            patient.                           - Resume  previous diet.                           - Continue present medications. Meryl Dare, MD 09/14/2016 2:07:49 PM This report has been signed electronically.

## 2016-09-14 NOTE — Progress Notes (Signed)
Report to PACU, RN, vss, BBS= Clear.  

## 2016-09-15 ENCOUNTER — Telehealth: Payer: Self-pay | Admitting: *Deleted

## 2016-09-15 NOTE — Telephone Encounter (Signed)
  Follow up Call-  Call back number 09/14/2016  Post procedure Call Back phone  # [phone]  Permission to leave phone message Yes  Some recent data might be hidden     Patient questions:  Do you have a fever, pain , or abdominal swelling? No. Pain Score  0 *  Have you tolerated food without any problems? Yes.    Have you been able to return to your normal activities? Yes.    Do you have any questions about your discharge instructions: Diet   No. Medications  No. Follow up visit  No.  Do you have questions or concerns about your Care? No.  Actions: * If pain score is 4 or above: No action needed, pain <4.

## 2016-09-20 ENCOUNTER — Telehealth: Payer: Self-pay | Admitting: Internal Medicine

## 2016-09-20 ENCOUNTER — Other Ambulatory Visit: Payer: PPO

## 2016-09-20 NOTE — Telephone Encounter (Signed)
Breast center called states they put order in for pt appt tomorrow but dr Fabian SharpPanosh still needs to sign the order. Please sign in Epic or sign the faxed copy they sent. cb

## 2016-09-21 ENCOUNTER — Ambulatory Visit
Admission: RE | Admit: 2016-09-21 | Discharge: 2016-09-21 | Disposition: A | Discharge status: Home / Self Care | Payer: PPO | Source: Ambulatory Visit | Attending: Internal Medicine | Admitting: Internal Medicine

## 2016-09-21 DIAGNOSIS — R928 Other abnormal and inconclusive findings on diagnostic imaging of breast: Secondary | ICD-10-CM

## 2016-09-21 DIAGNOSIS — N6489 Other specified disorders of breast: Secondary | ICD-10-CM | POA: Diagnosis not present

## 2016-09-21 NOTE — Telephone Encounter (Signed)
Ok with me 

## 2016-09-21 NOTE — Telephone Encounter (Signed)
Would you like for me to place order ? 

## 2016-09-22 ENCOUNTER — Other Ambulatory Visit: Payer: Self-pay | Admitting: Emergency Medicine

## 2016-09-22 NOTE — Telephone Encounter (Signed)
Spoke with Cherish from the breast center and patient has had exam. Nothing further needed.

## 2016-10-05 ENCOUNTER — Ambulatory Visit (INDEPENDENT_AMBULATORY_CARE_PROVIDER_SITE_OTHER): Payer: PPO

## 2016-10-05 DIAGNOSIS — Z23 Encounter for immunization: Secondary | ICD-10-CM | POA: Diagnosis not present

## 2017-01-24 DIAGNOSIS — N950 Postmenopausal bleeding: Secondary | ICD-10-CM | POA: Diagnosis not present

## 2017-01-24 DIAGNOSIS — N858 Other specified noninflammatory disorders of uterus: Secondary | ICD-10-CM | POA: Diagnosis not present

## 2017-02-22 DIAGNOSIS — N950 Postmenopausal bleeding: Secondary | ICD-10-CM | POA: Diagnosis not present

## 2017-02-23 NOTE — Patient Instructions (Addendum)
Your procedure is scheduled on:  Friday, Feb 8  [facility]  6 am  Pick up the phone at the desk and dial [phone].  Call this number if you have problems the morning of surgery: [phone].  Remember: Do NOT eat or Do NOT drink clear liquids (including water) after midnight Thursday  Take these medicines the morning of surgery with a SIP OF WATER:  None.  Ok to use flonase nasal spray if needed.  Stop herbal medications and supplements at this time.  Do NOT wear jewelry (body piercing), metal hair clips/bobby pins, make-up, or nail polish. Do NOT wear lotions, powders, or perfumes.  You may wear deoderant. Do NOT shave for 48 hours prior to surgery. Do NOT bring valuables to the hospital. Dentures, or bridgework may not be worn into surgery.  Have a responsible adult drive you home and stay with you for 24 hours after your procedure.  Home with Husband Cathy KrasClaude cell [phone].

## 2017-02-25 NOTE — H&P (Signed)
Cathy Meyers is an 72 y.o. female with postmenopausal bleeding.  She has never taken HRT. She had ultrasound with EMS <1064mm however focal areas of thickening were noted and fluid was in the cavity.  Endometrial bx showed no hyperplasia or malignancy.   Pertinent Gynecological History: Menses: post-menopausal Bleeding: post menopausal bleeding Contraception: post menopausal status DES exposure: unknown Blood transfusions: none Sexually transmitted diseases: no past history Previous GYN Procedures: DNC  Last mammogram: normal Date: 2018 Last pap: normal Date: 2018 OB History: G3, P3   Menstrual History: Menarche age: n/a No LMP recorded. Patient is postmenopausal.    Past Medical History:  Diagnosis Date  . Allergy   . Anemia   . Chronic kidney disease   . Gastritis    EGD neg bx 2011   . Hiatal hernia    small on egd  . History of varicella   . Hx: UTI (urinary tract infection)    pyelo  . Hyperlipidemia   . Pyelonephritis 06/04/2010  . Sickle cell trait [facility](HCC)     Past Surgical History:  Procedure Laterality Date  . COLONOSCOPY    . DILATION AND CURETTAGE OF UTERUS  06/2016  . UPPER GASTROINTESTINAL ENDOSCOPY      Family History  Adopted: Yes  Problem Relation Age of Onset  . Hypertension Mother        family hx  . Heart failure Mother   . Colon cancer Neg Hx   . Colon polyps Neg Hx   . Esophageal cancer Neg Hx   . Rectal cancer Neg Hx   . Stomach cancer Neg Hx     Social History:  reports that she quit smoking about 21 years ago. she has never used smokeless tobacco. She reports that she drinks alcohol. She reports that she does not use drugs.  Allergies:  Allergies  Allergen Reactions  . Penicillins Itching, Rash and Other (See Comments)    Has patient had a PCN reaction causing immediate rash, facial/tongue/throat swelling, SOB or lightheadedness with hypotension: Unknown Has patient had a PCN reaction causing severe rash involving mucus membranes or  skin necrosis: No Has patient had a PCN reaction that required hospitalization: No Has patient had a PCN reaction occurring within the last 10 years: No If all of the above answers are "NO", then may proceed with Cephalosporin use.     No medications prior to admission.    ROS  There were no vitals taken for this visit. Physical Exam  Constitutional: She is oriented to person, place, and time. She appears well-developed and well-nourished.  GI: Soft. There is no rebound.  Neurological: She is alert and oriented to person, place, and time.  Skin: Skin is warm and dry.  Psychiatric: She has a normal mood and affect.    No results found for this or any previous visit (from the past 24 hour(s)).  No results found.  Assessment/Plan: 71yo with postmenopausal bleeding -H/S, D&C -Patient has been counseled re: risk of bleeding, infection, scarring, and damage to surrounding tissues.  All quesitons were answered and the patient wishes to proceed.  Hadia Minier 02/25/2017, 8:32 PM

## 2017-02-28 ENCOUNTER — Other Ambulatory Visit: Payer: Self-pay

## 2017-02-28 ENCOUNTER — Encounter (HOSPITAL_COMMUNITY)
Admission: RE | Admit: 2017-02-28 | Discharge: 2017-02-28 | Disposition: A | Discharge status: Home / Self Care | Payer: PPO | Source: Ambulatory Visit | Attending: Obstetrics & Gynecology | Admitting: Obstetrics & Gynecology

## 2017-02-28 ENCOUNTER — Encounter (HOSPITAL_COMMUNITY): Payer: Self-pay

## 2017-02-28 DIAGNOSIS — N950 Postmenopausal bleeding: Secondary | ICD-10-CM | POA: Diagnosis not present

## 2017-02-28 DIAGNOSIS — E785 Hyperlipidemia, unspecified: Secondary | ICD-10-CM | POA: Diagnosis not present

## 2017-02-28 DIAGNOSIS — Z880 Allergy status to penicillin: Secondary | ICD-10-CM | POA: Diagnosis not present

## 2017-02-28 DIAGNOSIS — Z87891 Personal history of nicotine dependence: Secondary | ICD-10-CM | POA: Diagnosis not present

## 2017-02-28 DIAGNOSIS — D573 Sickle-cell trait: Secondary | ICD-10-CM | POA: Diagnosis not present

## 2017-02-28 HISTORY — DX: Headache, unspecified: R51.9

## 2017-02-28 HISTORY — DX: Headache: R51

## 2017-02-28 LAB — CBC
HCT: 36.6 % (ref 36.0–46.0)
Hemoglobin: 12.4 g/dL (ref 12.0–15.0)
MCH: 29.9 pg (ref 26.0–34.0)
MCHC: 33.9 g/dL (ref 30.0–36.0)
MCV: 88.2 fL (ref 78.0–100.0)
Platelets: 223 10*3/uL (ref 150–400)
RBC: 4.15 MIL/uL (ref 3.87–5.11)
RDW: 13.6 % (ref 11.5–15.5)
WBC: 6.1 10*3/uL (ref 4.0–10.5)

## 2017-02-28 LAB — BASIC METABOLIC PANEL
Anion gap: 8 (ref 5–15)
BUN: 9 mg/dL (ref 6–20)
CHLORIDE: 107 mmol/L (ref 101–111)
CO2: 23 mmol/L (ref 22–32)
Calcium: 9.2 mg/dL (ref 8.9–10.3)
Creatinine, Ser: 0.82 mg/dL (ref 0.44–1.00)
GFR calc Af Amer: >60 mL/min (ref >60)
GLUCOSE: 91 mg/dL (ref 65–99)
POTASSIUM: 3.7 mmol/L (ref 3.5–5.1)
Sodium: 138 mmol/L (ref 135–145)

## 2017-02-28 NOTE — Pre-Procedure Instructions (Signed)
Reviewed medical history, medications and EKG with Dr. Arby BarretteHatchett.  No orders given.  Ok for surgery.

## 2017-03-01 NOTE — Anesthesia Preprocedure Evaluation (Addendum)
Anesthesia Evaluation  Patient identified by MRN, date of birth, ID band Patient awake    Reviewed: Allergy & Precautions, NPO status , Patient's Chart, lab work & pertinent test results  Airway Mallampati: II  TM Distance: >3 FB Neck ROM: Full    Dental  (+) Dental Advisory Given   Pulmonary former smoker,    breath sounds clear to auscultation       Cardiovascular negative cardio ROS   Rhythm:Regular Rate:Normal     Neuro/Psych negative neurological ROS     GI/Hepatic Neg liver ROS, hiatal hernia,   Endo/Other  negative endocrine ROS  Renal/GU CRFRenal disease     Musculoskeletal   Abdominal   Peds  Hematology negative hematology ROS (+)   Anesthesia Other Findings   Reproductive/Obstetrics                            Lab Results  Component Value Date   WBC 6.1 02/28/2017   HGB 12.4 02/28/2017   HCT 36.6 02/28/2017   MCV 88.2 02/28/2017   PLT 223 02/28/2017   Lab Results  Component Value Date   CREATININE 0.82 02/28/2017   BUN 9 02/28/2017   NA 138 02/28/2017   K 3.7 02/28/2017   CL 107 02/28/2017   CO2 23 02/28/2017    Anesthesia Physical Anesthesia Plan  ASA: II  Anesthesia Plan: General   Post-op Pain Management:    Induction: Intravenous  PONV Risk Score and Plan: 3 and Ondansetron, Dexamethasone, Treatment may vary due to age or medical condition and Midazolam  Airway Management Planned: LMA  Additional Equipment:   Intra-op Plan:   Post-operative Plan: Extubation in OR  Informed Consent: I have reviewed the patients History and Physical, chart, labs and discussed the procedure including the risks, benefits and alternatives for the proposed anesthesia with the patient or authorized representative who has indicated his/her understanding and acceptance.   Dental advisory given  Plan Discussed with: CRNA  Anesthesia Plan Comments:        Anesthesia  Quick Evaluation

## 2017-03-02 ENCOUNTER — Encounter (HOSPITAL_COMMUNITY): Payer: Self-pay

## 2017-03-02 ENCOUNTER — Ambulatory Visit (HOSPITAL_COMMUNITY)
Admission: RE | Admit: 2017-03-02 | Discharge: 2017-03-02 | Disposition: A | Discharge status: Home / Self Care | Payer: PPO | Source: Ambulatory Visit | Attending: Obstetrics & Gynecology | Admitting: Obstetrics & Gynecology

## 2017-03-02 ENCOUNTER — Ambulatory Visit (HOSPITAL_COMMUNITY): Payer: PPO

## 2017-03-02 ENCOUNTER — Ambulatory Visit (HOSPITAL_COMMUNITY): Payer: PPO | Admitting: Anesthesiology

## 2017-03-02 ENCOUNTER — Encounter (HOSPITAL_COMMUNITY)
Admission: RE | Disposition: A | Discharge status: Home / Self Care | Payer: Self-pay | Source: Ambulatory Visit | Attending: Obstetrics & Gynecology

## 2017-03-02 DIAGNOSIS — D573 Sickle-cell trait: Secondary | ICD-10-CM | POA: Diagnosis not present

## 2017-03-02 DIAGNOSIS — Z880 Allergy status to penicillin: Secondary | ICD-10-CM | POA: Insufficient documentation

## 2017-03-02 DIAGNOSIS — D509 Iron deficiency anemia, unspecified: Secondary | ICD-10-CM | POA: Diagnosis not present

## 2017-03-02 DIAGNOSIS — E785 Hyperlipidemia, unspecified: Secondary | ICD-10-CM | POA: Insufficient documentation

## 2017-03-02 DIAGNOSIS — K449 Diaphragmatic hernia without obstruction or gangrene: Secondary | ICD-10-CM | POA: Diagnosis not present

## 2017-03-02 DIAGNOSIS — N950 Postmenopausal bleeding: Secondary | ICD-10-CM | POA: Insufficient documentation

## 2017-03-02 DIAGNOSIS — Z87891 Personal history of nicotine dependence: Secondary | ICD-10-CM | POA: Insufficient documentation

## 2017-03-02 DIAGNOSIS — R58 Hemorrhage, not elsewhere classified: Secondary | ICD-10-CM

## 2017-03-02 DIAGNOSIS — K2970 Gastritis, unspecified, without bleeding: Secondary | ICD-10-CM | POA: Diagnosis not present

## 2017-03-02 HISTORY — PX: HYSTEROSCOPY W/D&C: SHX1775

## 2017-03-02 SURGERY — DILATATION AND CURETTAGE /HYSTEROSCOPY
Anesthesia: General

## 2017-03-02 MED ORDER — HYDROMORPHONE HCL 1 MG/ML IJ SOLN: 0.2500 mg | INTRAMUSCULAR | Status: DC | PRN

## 2017-03-02 MED ORDER — LIDOCAINE HCL (CARDIAC) 20 MG/ML IV SOLN
INTRAVENOUS | Status: DC | PRN
  Administered 2017-03-02: 80 mg via INTRAVENOUS

## 2017-03-02 MED ORDER — FENTANYL CITRATE (PF) 100 MCG/2ML IJ SOLN
INTRAMUSCULAR | Status: AC
  Filled 2017-03-02: qty 2

## 2017-03-02 MED ORDER — LACTATED RINGERS IV SOLN
INTRAVENOUS | Status: DC
  Administered 2017-03-02: 06:00:00 via INTRAVENOUS

## 2017-03-02 MED ORDER — OXYCODONE-ACETAMINOPHEN 2.5-325 MG PO TABS: 1.0000 | ORAL_TABLET | ORAL | 0 refills | Status: DC | PRN

## 2017-03-02 MED ORDER — ONDANSETRON HCL 4 MG/2ML IJ SOLN
INTRAMUSCULAR | Status: AC
  Filled 2017-03-02: qty 2

## 2017-03-02 MED ORDER — EPHEDRINE 5 MG/ML INJ
INTRAVENOUS | Status: AC
  Filled 2017-03-02: qty 10

## 2017-03-02 MED ORDER — PROMETHAZINE HCL 25 MG/ML IJ SOLN: 6.2500 mg | INTRAMUSCULAR | Status: DC | PRN

## 2017-03-02 MED ORDER — DEXAMETHASONE SODIUM PHOSPHATE 4 MG/ML IJ SOLN
INTRAMUSCULAR | Status: AC
  Filled 2017-03-02: qty 1

## 2017-03-02 MED ORDER — ONDANSETRON HCL 4 MG/2ML IJ SOLN
INTRAMUSCULAR | Status: DC | PRN
  Administered 2017-03-02: 4 mg via INTRAVENOUS

## 2017-03-02 MED ORDER — PROPOFOL 10 MG/ML IV BOLUS
INTRAVENOUS | Status: AC
  Filled 2017-03-02: qty 20

## 2017-03-02 MED ORDER — LIDOCAINE HCL (CARDIAC) 20 MG/ML IV SOLN
INTRAVENOUS | Status: AC
  Filled 2017-03-02: qty 5

## 2017-03-02 MED ORDER — DEXAMETHASONE SODIUM PHOSPHATE 10 MG/ML IJ SOLN
INTRAMUSCULAR | Status: DC | PRN
  Administered 2017-03-02: 10 mg via INTRAVENOUS

## 2017-03-02 MED ORDER — FENTANYL CITRATE (PF) 100 MCG/2ML IJ SOLN
INTRAMUSCULAR | Status: DC | PRN
  Administered 2017-03-02: 25 ug via INTRAVENOUS

## 2017-03-02 MED ORDER — EPHEDRINE SULFATE 50 MG/ML IJ SOLN
INTRAMUSCULAR | Status: DC | PRN
  Administered 2017-03-02: 5 mg via INTRAVENOUS

## 2017-03-02 MED ORDER — IBUPROFEN 600 MG PO TABS: 600.0000 mg | ORAL_TABLET | Freq: Four times a day (QID) | ORAL | 0 refills | Status: AC | PRN

## 2017-03-02 MED ORDER — PROPOFOL 10 MG/ML IV BOLUS
INTRAVENOUS | Status: DC | PRN
  Administered 2017-03-02: 150 mg via INTRAVENOUS

## 2017-03-02 SURGICAL SUPPLY — 19 items
ABLATOR ENDOMETRIAL BIPOLAR (ABLATOR) IMPLANT
BIPOLAR CUTTING LOOP 21FR (ELECTRODE)
CANISTER SUCT 3000ML PPV (MISCELLANEOUS) ×3 IMPLANT
CATH ROBINSON RED A/P 16FR (CATHETERS) ×3 IMPLANT
ELECT COAG BIPOL BALL 21FR (ELECTRODE) IMPLANT
ELECT REM PT RETURN 9FT ADLT (ELECTROSURGICAL) ×3
ELECTRODE REM PT RTRN 9FT ADLT (ELECTROSURGICAL) ×1 IMPLANT
GLOVE BIO SURGEON STRL SZ 6 (GLOVE) ×3 IMPLANT
GLOVE BIOGEL PI IND STRL 6 (GLOVE) ×2 IMPLANT
GLOVE BIOGEL PI IND STRL 7.0 (GLOVE) ×1 IMPLANT
GLOVE BIOGEL PI INDICATOR 6 (GLOVE) ×4
GLOVE BIOGEL PI INDICATOR 7.0 (GLOVE) ×2
GOWN STRL REUS W/TWL LRG LVL3 (GOWN DISPOSABLE) ×6 IMPLANT
LOOP CUTTING BIPOLAR 21FR (ELECTRODE) IMPLANT
PACK VAGINAL MINOR WOMEN LF (CUSTOM PROCEDURE TRAY) ×3 IMPLANT
PAD OB MATERNITY 4.3X12.25 (PERSONAL CARE ITEMS) ×3 IMPLANT
TOWEL OR 17X24 6PK STRL BLUE (TOWEL DISPOSABLE) ×6 IMPLANT
TUBING AQUILEX INFLOW (TUBING) ×3 IMPLANT
TUBING AQUILEX OUTFLOW (TUBING) ×3 IMPLANT

## 2017-03-02 NOTE — Anesthesia Postprocedure Evaluation (Signed)
Anesthesia Post Note  Patient: Cathy Meyers  Procedure(s) Performed: DILATATION AND CURETTAGE /HYSTEROSCOPY (N/A )     Patient location during evaluation: PACU Anesthesia Type: General Level of consciousness: awake and alert Pain management: pain level controlled Vital Signs Assessment: post-procedure vital signs reviewed and stable Respiratory status: spontaneous breathing, nonlabored ventilation, respiratory function stable and patient connected to nasal cannula oxygen Cardiovascular status: blood pressure returned to baseline and stable Postop Assessment: no apparent nausea or vomiting Anesthetic complications: no    Last Vitals:  Vitals:   03/02/17 0909 03/02/17 0915  BP:  (!) 105/59  Pulse: (!) 58 63  Resp: 12 14  Temp:    SpO2: 96% 96%    Last Pain:  Vitals:   03/02/17 0915  TempSrc:   PainSc: 2    Pain Goal: Patients Stated Pain Goal: 3 (03/02/17 0915)               Kennieth RadFitzgerald, Broady Lafoy E

## 2017-03-02 NOTE — Discharge Instructions (Signed)
°  DISCHARGE INSTRUCTIONS: D&C / D&E Call MD for T>100.4, heavy vaginal bleeding, severe abdominal pain, or respiratory distress.  Call office to schedule postop appointment in 2 weeks.  Pelvic rest x 2 weeks.  No driving while taking narcotics.    Personal hygiene:  Use sanitary pads for vaginal drainage, not tampons.  Shower the day after your procedure.  NO tub baths, pools or Jacuzzis for 2-3 weeks.  Wipe front to back after using the bathroom.  Activity and limitations:  Do NOT drive or operate any equipment for 24 hours. The effects of anesthesia are still present and drowsiness may result.  Do NOT rest in bed all day.  Walking is encouraged.  Walk up and down stairs slowly.  You may resume your normal activity in one to two days or as indicated by your physician.  Sexual activity: NO intercourse for at least 2 weeks after the procedure, or as indicated by your physician.  Diet: Eat a light meal as desired this evening. You may resume your usual diet tomorrow.  Return to work: You may resume your work activities in one to two days or as indicated by your doctor.  What to expect after your surgery: Expect to have vaginal bleeding/discharge for 2-3 days and spotting for up to 10 days. It is not unusual to have soreness for up to 1-2 weeks. You may have a slight burning sensation when you urinate for the first day. Mild cramps may continue for a couple of days. You may have a regular period in 2-6 weeks.  Call your doctor for any of the following:  Excessive vaginal bleeding, saturating and changing one pad every hour.  Inability to urinate 6 hours after discharge from hospital.  Pain not relieved by pain medication.  Fever of 100.4 F or greater.  Unusual vaginal discharge or odor.

## 2017-03-02 NOTE — Progress Notes (Signed)
No change to H&P.  Kyera Felan, DO 

## 2017-03-02 NOTE — Transfer of Care (Signed)
Immediate Anesthesia Transfer of Care Note  Patient: Cathy Meyers  Procedure(s) Performed: DILATATION AND CURETTAGE /HYSTEROSCOPY (N/A )  Patient Location: PACU  Anesthesia Type:General  Level of Consciousness: awake, alert  and oriented  Airway & Oxygen Therapy: Patient Spontanous Breathing and Patient connected to nasal cannula oxygen  Post-op Assessment: Report given to RN, Post -op Vital signs reviewed and stable and Patient moving all extremities  Post vital signs: Reviewed and stable  Last Vitals:  Vitals:   03/02/17 0547  BP: 123/81  Pulse: 69  Resp: 16  Temp: 36.7 C  SpO2: 99%    Last Pain:  Vitals:   03/02/17 0547  TempSrc: Oral      Patients Stated Pain Goal: 3 (03/02/17 0547)  Complications: No apparent anesthesia complications

## 2017-03-02 NOTE — Anesthesia Procedure Notes (Signed)
Procedure Name: LMA Insertion Date/Time: 03/02/2017 7:23 AM Performed by: Elgie CongoMalinova, Natali Lavallee H, CRNA Pre-anesthesia Checklist: Patient identified, Emergency Drugs available, Suction available and Patient being monitored Patient Re-evaluated:Patient Re-evaluated prior to induction Oxygen Delivery Method: Circle system utilized Induction Type: IV induction LMA: LMA inserted LMA Size: 3.0 Placement Confirmation: positive ETCO2 and breath sounds checked- equal and bilateral Tube secured with: Tape Dental Injury: Teeth and Oropharynx as per pre-operative assessment

## 2017-03-02 NOTE — Op Note (Signed)
PREOPERATIVE DIAGNOSIS:  72 y.o. with postmenopausal bleeding  POSTOPERATIVE DIAGNOSIS: The same  PROCEDURE: Hysteroscopy, Dilation and Curettage.  SURGEON:  Dr. Mitchel HonourMegan Breyona Swander  INDICATIONS: 72 y.o. here for scheduled surgery for evaluation of postmenopausal bleeding.   Risks of surgery were discussed with the patient including but not limited to: bleeding which may require transfusion; infection which may require antibiotics; injury to uterus or surrounding organs; intrauterine scarring which may impair future fertility; need for additional procedures including laparotomy or laparoscopy; and other postoperative/anesthesia complications. Written informed consent was obtained.    FINDINGS:  A 6 week size uterus.  Focal endometrial thickening on anterior wall of uterus.  Normal ostia bilaterally.  ANESTHESIA:   General  ESTIMATED BLOOD LOSS:  Less than 20 ml  SPECIMENS: Endometrial curettings sent to pathology  COMPLICATIONS:  None immediate.  PROCEDURE DETAILS:  The patient was taken to the operating room where general anesthesia was administered and was found to be adequate.  After an adequate timeout was performed, she was placed in the dorsal lithotomy position and examined; then prepped and draped in the sterile manner.   Her bladder was catheterized for an unmeasured amount of clear, yellow urine. A speculum was then placed in the patient's vagina and a single tooth tenaculum was applied to the anterior lip of the cervix.   The uteus was sounded to 7 cm and dilated manually with metal dilators to accommodate the 5.5 mm diagnostic hysteroscope.  Once the cervix was dilated, the hysteroscope was inserted under direct visualization using saline as a suspension medium.  The uterine cavity was carefully examined, both ostia were recognized.   After further careful visualization of the uterine cavity, the hysteroscope was removed under direct visualization.  A sharp curettage was then performed to  obtain a small amount of endometrial curettings.  The tenaculum was removed from the anterior lip of the cervix and the vaginal speculum was removed after noting good hemostasis.  The patient tolerated the procedure well and was taken to the recovery area awake, extubated and in stable condition.

## 2017-03-03 ENCOUNTER — Encounter (HOSPITAL_COMMUNITY): Payer: Self-pay | Admitting: Obstetrics & Gynecology

## 2017-04-16 NOTE — Progress Notes (Signed)
Chief Complaint  Patient presents with  . Annual Exam    Pt notes recent D&C 03/02/17, having constipation since surgery, having to take laxative to regulate BMs    HPI: Cathy Meyers 72 y.o. comes in today for Preventive Medicare exam/ wellness visit .Since last visit.  Doing ok had dand c for  Bleeding    And fu  No cancer   No there bleeding   No pain meds x ibu  But constipated since then no abd pain fever other meds  Tried otc .  Med some help but continuing    No associated urinary uti sx   Health Maintenance  Topic Date Due  . TETANUS/TDAP  01/23/2014  . MAMMOGRAM  09/13/2018  . COLONOSCOPY  09/15/2026  . INFLUENZA VACCINE  Completed  . DEXA SCAN  Completed  . Hepatitis C Screening  Completed  . PNA vac Low Risk Adult  Completed   Health Maintenance Review LIFESTYLE:  Exercise:   [facility]  Tobacco/ETS:  no Alcohol:   no Sugar beverages:  sweetened green tea . Sleep: about 6  Drug use: no HH: 2  No pets  [facility]   hosp 8 hours    Hearing:  Ok   Vision:  No limitations at present . Last eye check  Due soon   Safety:  Has smoke detector and wears seat belts.  No firearms. No excess sun exposure. Sees dentist regularly.  Falls:  No   Memory: Felt to be good  , no concern from her or her family.  Depression: No anhedonia unusual crying or depressive symptoms  Nutrition: Eats well balanced diet; adequate calcium and vitamin D. No swallowing chewing problems.  Injury: no major injuries in the last six months.  Other healthcare providers:  Reviewed today .  Social:  Lives with spouse married. No pets.   Preventive parameters: up-to-date  Reviewed   ADLS:   There are no problems or need for assistance  driving, feeding, obtaining food, dressing, toileting and bathing, managing money using phone. She is independent.   ROS:  GEN/ HEENT: No fever, significant weight changes sweats headaches vision problems hearing changes, CV/ PULM; No chest pain  shortness of breath cough, syncope,edema  change in exercise tolerance. GI /GU: No adominal pain, vomiting,see  HPI  No significant GU symptoms. SKIN/HEME: ,no acute skin rashes suspicious lesions or other  bleeding. No lymphadenopathy, nodules, masses.  NEURO/ PSYCH:  No neurologic signs such as weakness numbness. No depression anxiety. IMM/ Allergy: No unusual infections.  Allergy .   REST of 12 system review negative except as per HPI   Past Medical History:  Diagnosis Date  . Allergy   . Anemia   . Chronic kidney disease   . Gastritis    EGD neg bx 2011   . Headache    otc med prn  . Hiatal hernia    small on egd  . History of varicella   . Hx: UTI (urinary tract infection)    pyelo  . Hyperlipidemia    diet controlled   . Pyelonephritis 06/04/2010  . Shingles 11/24/2011   Classic typical symptoms expectant management and treatment begin antiviral. Discussed pain control options   . Sickle cell trait ([place])   . SVD (spontaneous vaginal delivery)    x 3    Family History  Adopted: Yes  Problem Relation Age of Onset  . Hypertension Mother        family hx  . Heart  failure Mother   . Colon cancer Neg Hx   . Colon polyps Neg Hx   . Esophageal cancer Neg Hx   . Rectal cancer Neg Hx   . Stomach cancer Neg Hx     Social History   Socioeconomic History  . Marital status: Married    Spouse name: Not on file  . Number of children: Not on file  . Years of education: Not on file  . Highest education level: Not on file  Occupational History  . Not on file  Social Needs  . Financial resource strain: Not on file  . Food insecurity:    Worry: Not on file    Inability: Not on file  . Transportation needs:    Medical: Not on file    Non-medical: Not on file  Tobacco Use  . Smoking status: Former Smoker    Packs/day: 0.10    Types: Cigarettes    Last attempt to quit: 1998    Years since quitting: 21.2  . Smokeless tobacco: Never Used  . Tobacco comment: smoked in  school periodically   Substance and Sexual Activity  . Alcohol use: Yes    Comment: socially  . Drug use: No  . Sexual activity: Not Currently    Birth control/protection: Post-menopausal  Lifestyle  . Physical activity:    Days per week: Not on file    Minutes per session: Not on file  . Stress: Not on file  Relationships  . Social connections:    Talks on phone: Not on file    Gets together: Not on file    Attends religious service: Not on file    Active member of club or organization: Not on file    Attends meetings of clubs or organizations: Not on file    Relationship status: Not on file  Other Topics Concern  . 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 fluticasone (FLONASE) 50 MCG/ACT nasal spray PLACE 2 SPRAYS INTO BOTH NOSTRILS DAILY. (Patient taking differently: Place 2 sprays into both nostrils daily. )  . ibuprofen (ADVIL,MOTRIN) 600 MG tablet Take 1 tablet (600 mg total) by mouth every 6 (six) hours as needed.  . Multiple Vitamin (MULTIVITAMIN) tablet Take 1 tablet by mouth daily.    . naproxen sodium (ALEVE) 220 MG tablet Take 440 mg by mouth daily as needed (for headache).  Marland Kitchen oxycodone-acetaminophen (PERCOCET) 2.5-325 MG tablet Take 1-2 tablets by mouth every 4 (four) hours as needed for pain.   No facility-administered encounter medications on file as of 04/18/2017.     EXAM:  BP 102/70 (BP Location: Right Arm, Patient Position: Sitting, Cuff Size: Normal)   Pulse 62   Temp 98 F (36.7 C) (Oral)   Ht '5\' 2"'$  (1.575 m)   Wt 170 lb 14.4 oz (77.5 kg)   BMI 31.26 kg/m   Body mass index is 31.26 kg/m.  Physical Exam: Vital signs reviewed [id number] is a well-developed well-nourished alert  cooperative   who appears stated age in no acute distress.  HEENT: normocephalic atraumatic , Eyes: PERRL EOM's full, conjunctiva clear,  Nares: paten,t no deformity discharge or tenderness., Ears: no deformity EAC's clear TMs with normal landmarks. Mouth: clear OP, no lesions, edema.  Moist mucous membranes. Dentition in adequate repair. NECK: supple without masses, thyromegaly or bruits. CHEST/PULM:  Clear to auscultation and percussion breath sounds equal no wheeze , rales or rhonchi. No chest wall deformities or tenderness. CV: PMI is nondisplaced, S1 S2 no gallops, murmurs, rubs. Peripheral pulses are full without delay.No JVD .  ABDOMEN: Bowel sounds normal nontender  No guard or rebound, no hepato splenomegal no CVA tenderness.   Extremtities:  No clubbing cyanosis or edema, no acute joint swelling or redness no focal atrophy NEURO:  Oriented x3, cranial nerves 3-12 appear to be intact, no obvious focal weakness,gait within normal limits no abnormal reflexes or asymmetrical SKIN: No acute rashes normal turgor, color, no bruising or petechiae. PSYCH: Oriented, good eye contact, no obvious depression anxiety, cognition and judgment appear normal. LN: no cervical axillary inguinal adenopathy No noted deficits in memory, attention, and speech.   Lab Results  Component Value Date   WBC 6.1 02/28/2017   HGB 12.4 02/28/2017   HCT 36.6 02/28/2017   PLT 223 02/28/2017   GLUCOSE 91 02/28/2017   CHOL 193 02/23/2016   TRIG 58.0 02/23/2016   HDL 60.30 02/23/2016   LDLDIRECT 122.4 04/25/2011   LDLCALC 121 (H) 02/23/2016   ALT 10 02/23/2016   AST 14 02/23/2016   NA 138 02/28/2017   K 3.7 02/28/2017   CL 107 02/28/2017   CREATININE 0.82 02/28/2017   BUN 9 02/28/2017   CO2 23 02/28/2017   TSH 3.21 02/23/2016    ASSESSMENT AND PLAN:  Discussed the following assessment and plan:  Visit for preventive health examination  Elevated LDL cholesterol level - Plan: Lipid panel,  TSH  Constipation, unspecified constipation type - seems reactive from surgeical procedure  check tsh  - Plan: Lipid panel, TSH Lipid screen fu . Disc lsi limiit s eet beverages   Add  Fiber and miralax as needed and   Fu gyne  No alarm findings  Lab reviewed  In system.  Patient Care Team: Amelio Brosky, Standley Brooking, MD as PCP - General Ladene Artist, MD as Consulting Physician (Gastroenterology) Linda Hedges, DO as Consulting Physician (Obstetrics and Gynecology)  Patient Instructions  Will notify you  of labs when available.  Continue lifestyle intervention healthy eating and exercise  shingrix  New shingles vaccine    Tetanus    Booster   At pharmacy  .  Try generic Miralax   1 capful per day  Until  Better  Or   as needed    Health Maintenance, Female Adopting a healthy lifestyle and getting preventive care can go a long way to promote health and wellness. Talk with your health care provider about what schedule of regular examinations is right for you. This is a good chance for you to check in with your provider about disease prevention and staying healthy. In between checkups, there are plenty of things you can do on your own. Experts have done a lot of research about which lifestyle changes and preventive measures are most likely to keep you healthy. Ask your health care provider for more information. Weight and diet Eat a healthy diet  Be sure to include plenty of vegetables, fruits, low-fat dairy products, and lean protein.  Do not eat a lot of foods high in solid fats, added sugars, or salt.  Get regular exercise. This is one of the  most important things you can do for your health. ? Most adults should exercise for at least 150 minutes each week. The exercise should increase your heart rate and make you sweat (moderate-intensity exercise). ? Most adults should also do strengthening exercises at least twice a week. This is in addition to the moderate-intensity exercise.  Maintain  a healthy weight  Body mass index (BMI) is a measurement that can be used to identify possible weight problems. It estimates body fat based on height and weight. Your health care provider can help determine your BMI and help you achieve or maintain a healthy weight.  For females 20 years of age and older: ? A BMI below 18.5 is considered underweight. ? A BMI of 18.5 to 24.9 is normal. ? A BMI of 25 to 29.9 is considered overweight. ? A BMI of 30 and above is considered obese.  Watch levels of cholesterol and blood lipids  You should start having your blood tested for lipids and cholesterol at 71 years of age, then have this test every 5 years.  You may need to have your cholesterol levels checked more often if: ? Your lipid or cholesterol levels are high. ? You are older than 71 years of age. ? You are at high risk for heart disease.  Cancer screening Lung Cancer  Lung cancer screening is recommended for adults 17-31 years old who are at high risk for lung cancer because of a history of smoking.  A yearly low-dose CT scan of the lungs is recommended for people who: ? Currently smoke. ? Have quit within the past 15 years. ? Have at least a 30-pack-year history of smoking. A pack year is smoking an average of one pack of cigarettes a day for 1 year.  Yearly screening should continue until it has been 15 years since you quit.  Yearly screening should stop if you develop a health problem that would prevent you from having lung cancer treatment.  Breast Cancer  Practice breast self-awareness. This means understanding how your breasts normally appear and feel.  It also means doing regular breast self-exams. Let your health care provider know about any changes, no matter how small.  If you are in your 20s or 30s, you should have a clinical breast exam (CBE) by a health care provider every 1-3 years as part of a regular health exam.  If you are 76 or older, have a CBE every year.  Also consider having a breast X-ray (mammogram) every year.  If you have a family history of breast cancer, talk to your health care provider about genetic screening.  If you are at high risk for breast cancer, talk to your health care provider about having an MRI and a mammogram every year.  Breast cancer gene (BRCA) assessment is recommended for women who have family members with BRCA-related cancers. BRCA-related cancers include: ? Breast. ? Ovarian. ? Tubal. ? Peritoneal cancers.  Results of the assessment will determine the need for genetic counseling and BRCA1 and BRCA2 testing.  Cervical Cancer Your health care provider may recommend that you be screened regularly for cancer of the pelvic organs (ovaries, uterus, and vagina). This screening involves a pelvic examination, including checking for microscopic changes to the surface of your cervix (Pap test). You may be encouraged to have this screening done every 3 years, beginning at age 75.  For women ages 24-65, health care providers may recommend pelvic exams and Pap testing every 3 years, or they may recommend  the Pap and pelvic exam, combined with testing for human papilloma virus (HPV), every 5 years. Some types of HPV increase your risk of cervical cancer. Testing for HPV may also be done on women of any age with unclear Pap test results.  Other health care providers may not recommend any screening for nonpregnant women who are considered low risk for pelvic cancer and who do not have symptoms. Ask your health care provider if a screening pelvic exam is right for you.  If you have had past treatment for cervical cancer or a condition that could lead to cancer, you need Pap tests and screening for cancer for at least 20 years after your treatment. If Pap tests have been discontinued, your risk factors (such as having a new sexual partner) need to be reassessed to determine if screening should resume. Some women have medical problems  that increase the chance of getting cervical cancer. In these cases, your health care provider may recommend more frequent screening and Pap tests.  Colorectal Cancer  This type of cancer can be detected and often prevented.  Routine colorectal cancer screening usually begins at 71 years of age and continues through 71 years of age.  Your health care provider may recommend screening at an earlier age if you have risk factors for colon cancer.  Your health care provider may also recommend using home test kits to check for hidden blood in the stool.  A small camera at the end of a tube can be used to examine your colon directly (sigmoidoscopy or colonoscopy). This is done to check for the earliest forms of colorectal cancer.  Routine screening usually begins at age 40.  Direct examination of the colon should be repeated every 5-10 years through 71 years of age. However, you may need to be screened more often if early forms of precancerous polyps or small growths are found.  Skin Cancer  Check your skin from head to toe regularly.  Tell your health care provider about any new moles or changes in moles, especially if there is a change in a mole's shape or color.  Also tell your health care provider if you have a mole that is larger than the size of a pencil eraser.  Always use sunscreen. Apply sunscreen liberally and repeatedly throughout the day.  Protect yourself by wearing long sleeves, pants, a wide-brimmed hat, and sunglasses whenever you are outside.  Heart disease, diabetes, and high blood pressure  High blood pressure causes heart disease and increases the risk of stroke. High blood pressure is more likely to develop in: ? People who have blood pressure in the high end of the normal range (130-139/85-89 mm Hg). ? People who are overweight or obese. ? People who are African American.  If you are 56-72 years of age, have your blood pressure checked every 3-5 years. If you are  43 years of age or older, have your blood pressure checked every year. You should have your blood pressure measured twice-once when you are at a hospital or clinic, and once when you are not at a hospital or clinic. Record the average of the two measurements. To check your blood pressure when you are not at a hospital or clinic, you can use: ? An automated blood pressure machine at a pharmacy. ? A home blood pressure monitor.  If you are between 42 years and 4 years old, ask your health care provider if you should take aspirin to prevent strokes.  Have regular diabetes  screenings. This involves taking a blood sample to check your fasting blood sugar level. ? If you are at a normal weight and have a low risk for diabetes, have this test once every three years after 71 years of age. ? If you are overweight and have a high risk for diabetes, consider being tested at a younger age or more often. Preventing infection Hepatitis B  If you have a higher risk for hepatitis B, you should be screened for this virus. You are considered at high risk for hepatitis B if: ? You were born in a country where hepatitis B is common. Ask your health care provider which countries are considered high risk. ? Your parents were born in a high-risk country, and you have not been immunized against hepatitis B (hepatitis B vaccine). ? You have HIV or AIDS. ? You use needles to inject street drugs. ? You live with someone who has hepatitis B. ? You have had sex with someone who has hepatitis B. ? You get hemodialysis treatment. ? You take certain medicines for conditions, including cancer, organ transplantation, and autoimmune conditions.  Hepatitis C  Blood testing is recommended for: ? Everyone born from [birth year] through 1964. ? Anyone with known risk factors for hepatitis C.  Sexually transmitted infections (STIs)  You should be screened for sexually transmitted infections (STIs) including gonorrhea and chlamydia  if: ? You are sexually active and are younger than 71 years of age. ? You are older than 71 years of age and your health care provider tells you that you are at risk for this type of infection. ? Your sexual activity has changed since you were last screened and you are at an increased risk for chlamydia or gonorrhea. Ask your health care provider if you are at risk.  If you do not have HIV, but are at risk, it may be recommended that you take a prescription medicine daily to prevent HIV infection. This is called pre-exposure prophylaxis (PrEP). You are considered at risk if: ? You are sexually active and do not regularly use condoms or know the HIV status of your partner(s). ? You take drugs by injection. ? You are sexually active with a partner who has HIV.  Talk with your health care provider about whether you are at high risk of being infected with HIV. If you choose to begin PrEP, you should first be tested for HIV. You should then be tested every 3 months for as long as you are taking PrEP. Pregnancy  If you are premenopausal and you may become pregnant, ask your health care provider about preconception counseling.  If you may become pregnant, take 400 to 800 micrograms (mcg) of folic acid every day.  If you want to prevent pregnancy, talk to your health care provider about birth control (contraception). Osteoporosis and menopause  Osteoporosis is a disease in which the bones lose minerals and strength with aging. This can result in serious bone fractures. Your risk for osteoporosis can be identified using a bone density scan.  If you are 75 years of age or older, or if you are at risk for osteoporosis and fractures, ask your health care provider if you should be screened.  Ask your health care provider whether you should take a calcium or vitamin D supplement to lower your risk for osteoporosis.  Menopause may have certain physical symptoms and risks.  Hormone replacement therapy  may reduce some of these symptoms and risks. Talk to your health care provider  about whether hormone replacement therapy is right for you. Follow these instructions at home:  Schedule regular health, dental, and eye exams.  Stay current with your immunizations.  Do not use any tobacco products including cigarettes, chewing tobacco, or electronic cigarettes.  If you are pregnant, do not drink alcohol.  If you are breastfeeding, limit how much and how often you drink alcohol.  Limit alcohol intake to no more than 1 drink per day for nonpregnant women. One drink equals 12 ounces of beer, 5 ounces of wine, or 1 ounces of hard liquor.  Do not use street drugs.  Do not share needles.  Ask your health care provider for help if you need support or information about quitting drugs.  Tell your health care provider if you often feel depressed.  Tell your health care provider if you have ever been abused or do not feel safe at home. This information is not intended to replace advice given to you by your health care provider. Make sure you discuss any questions you have with your health care provider. Document Released: 07/25/2010 Document Revised: 06/17/2015 Document Reviewed: 10/13/2014 Elsevier Interactive Patient Education  2018 [place]. Armani Gawlik M.D.

## 2017-04-18 ENCOUNTER — Encounter: Payer: Self-pay | Admitting: Internal Medicine

## 2017-04-18 ENCOUNTER — Ambulatory Visit (INDEPENDENT_AMBULATORY_CARE_PROVIDER_SITE_OTHER): Payer: PPO | Admitting: Internal Medicine

## 2017-04-18 VITALS — BP 102/70 | HR 62 | Temp 98.0°F | Ht 62.0 in | Wt 170.9 lb

## 2017-04-18 DIAGNOSIS — E7800 Pure hypercholesterolemia, unspecified: Secondary | ICD-10-CM | POA: Diagnosis not present

## 2017-04-18 DIAGNOSIS — K5900 Constipation, unspecified: Secondary | ICD-10-CM | POA: Diagnosis not present

## 2017-04-18 DIAGNOSIS — Z0001 Encounter for general adult medical examination with abnormal findings: Secondary | ICD-10-CM | POA: Diagnosis not present

## 2017-04-18 DIAGNOSIS — Z0000 Encounter for general adult medical examination without abnormal findings: Secondary | ICD-10-CM

## 2017-04-18 LAB — TSH: TSH: 2.5 u[IU]/mL (ref 0.35–4.50)

## 2017-04-18 LAB — LIPID PANEL
Cholesterol: 203 mg/dL — ABNORMAL HIGH (ref 0–200)
HDL: 62.6 mg/dL (ref >39.00)
LDL Cholesterol: 124 mg/dL — ABNORMAL HIGH (ref 0–99)
NonHDL: 140.34
TRIGLYCERIDES: 80 mg/dL (ref 0.0–149.0)
Total CHOL/HDL Ratio: 3
VLDL: 16 mg/dL (ref 0.0–40.0)

## 2017-04-18 NOTE — Patient Instructions (Signed)
Will notify you  of labs when available.  Continue lifestyle intervention healthy eating and exercise  shingrix  New shingles vaccine    Tetanus    Booster   At pharmacy  .  Try generic Miralax   1 capful per day  Until  Better  Or   as needed    Health Maintenance, Female Adopting a healthy lifestyle and getting preventive care can go a long way to promote health and wellness. Talk with your health care provider about what schedule of regular examinations is right for you. This is a good chance for you to check in with your provider about disease prevention and staying healthy. In between checkups, there are plenty of things you can do on your own. Experts have done a lot of research about which lifestyle changes and preventive measures are most likely to keep you healthy. Ask your health care provider for more information. Weight and diet Eat a healthy diet  Be sure to include plenty of vegetables, fruits, low-fat dairy products, and lean protein.  Do not eat a lot of foods high in solid fats, added sugars, or salt.  Get regular exercise. This is one of the most important things you can do for your health. ? Most adults should exercise for at least 150 minutes each week. The exercise should increase your heart rate and make you sweat (moderate-intensity exercise). ? Most adults should also do strengthening exercises at least twice a week. This is in addition to the moderate-intensity exercise.  Maintain a healthy weight  Body mass index (BMI) is a measurement that can be used to identify possible weight problems. It estimates body fat based on height and weight. Your health care provider can help determine your BMI and help you achieve or maintain a healthy weight.  For females 107 years of age and older: ? A BMI below 18.5 is considered underweight. ? A BMI of 18.5 to 24.9 is normal. ? A BMI of 25 to 29.9 is considered overweight. ? A BMI of 30 and above is considered obese.  Watch  levels of cholesterol and blood lipids  You should start having your blood tested for lipids and cholesterol at 71 years of age, then have this test every 5 years.  You may need to have your cholesterol levels checked more often if: ? Your lipid or cholesterol levels are high. ? You are older than 71 years of age. ? You are at high risk for heart disease.  Cancer screening Lung Cancer  Lung cancer screening is recommended for adults 34-29 years old who are at high risk for lung cancer because of a history of smoking.  A yearly low-dose CT scan of the lungs is recommended for people who: ? Currently smoke. ? Have quit within the past 15 years. ? Have at least a 30-pack-year history of smoking. A pack year is smoking an average of one pack of cigarettes a day for 1 year.  Yearly screening should continue until it has been 15 years since you quit.  Yearly screening should stop if you develop a health problem that would prevent you from having lung cancer treatment.  Breast Cancer  Practice breast self-awareness. This means understanding how your breasts normally appear and feel.  It also means doing regular breast self-exams. Let your health care provider know about any changes, no matter how small.  If you are in your 20s or 30s, you should have a clinical breast exam (CBE) by a health  care provider every 1-3 years as part of a regular health exam.  If you are 80 or older, have a CBE every year. Also consider having a breast X-ray (mammogram) every year.  If you have a family history of breast cancer, talk to your health care provider about genetic screening.  If you are at high risk for breast cancer, talk to your health care provider about having an MRI and a mammogram every year.  Breast cancer gene (BRCA) assessment is recommended for women who have family members with BRCA-related cancers. BRCA-related cancers include: ? Breast. ? Ovarian. ? Tubal. ? Peritoneal  cancers.  Results of the assessment will determine the need for genetic counseling and BRCA1 and BRCA2 testing.  Cervical Cancer Your health care provider may recommend that you be screened regularly for cancer of the pelvic organs (ovaries, uterus, and vagina). This screening involves a pelvic examination, including checking for microscopic changes to the surface of your cervix (Pap test). You may be encouraged to have this screening done every 3 years, beginning at age 60.  For women ages 63-65, health care providers may recommend pelvic exams and Pap testing every 3 years, or they may recommend the Pap and pelvic exam, combined with testing for human papilloma virus (HPV), every 5 years. Some types of HPV increase your risk of cervical cancer. Testing for HPV may also be done on women of any age with unclear Pap test results.  Other health care providers may not recommend any screening for nonpregnant women who are considered low risk for pelvic cancer and who do not have symptoms. Ask your health care provider if a screening pelvic exam is right for you.  If you have had past treatment for cervical cancer or a condition that could lead to cancer, you need Pap tests and screening for cancer for at least 20 years after your treatment. If Pap tests have been discontinued, your risk factors (such as having a new sexual partner) need to be reassessed to determine if screening should resume. Some women have medical problems that increase the chance of getting cervical cancer. In these cases, your health care provider may recommend more frequent screening and Pap tests.  Colorectal Cancer  This type of cancer can be detected and often prevented.  Routine colorectal cancer screening usually begins at 71 years of age and continues through 71 years of age.  Your health care provider may recommend screening at an earlier age if you have risk factors for colon cancer.  Your health care provider may also  recommend using home test kits to check for hidden blood in the stool.  A small camera at the end of a tube can be used to examine your colon directly (sigmoidoscopy or colonoscopy). This is done to check for the earliest forms of colorectal cancer.  Routine screening usually begins at age 57.  Direct examination of the colon should be repeated every 5-10 years through 71 years of age. However, you may need to be screened more often if early forms of precancerous polyps or small growths are found.  Skin Cancer  Check your skin from head to toe regularly.  Tell your health care provider about any new moles or changes in moles, especially if there is a change in a mole's shape or color.  Also tell your health care provider if you have a mole that is larger than the size of a pencil eraser.  Always use sunscreen. Apply sunscreen liberally and repeatedly throughout the  day.  Protect yourself by wearing long sleeves, pants, a wide-brimmed hat, and sunglasses whenever you are outside.  Heart disease, diabetes, and high blood pressure  High blood pressure causes heart disease and increases the risk of stroke. High blood pressure is more likely to develop in: ? People who have blood pressure in the high end of the normal range (130-139/85-89 mm Hg). ? People who are overweight or obese. ? People who are African American.  If you are 18-39 years of age, have your blood pressure checked every 3-5 years. If you are 40 years of age or older, have your blood pressure checked every year. You should have your blood pressure measured twice-once when you are at a hospital or clinic, and once when you are not at a hospital or clinic. Record the average of the two measurements. To check your blood pressure when you are not at a hospital or clinic, you can use: ? An automated blood pressure machine at a pharmacy. ? A home blood pressure monitor.  If you are between 55 years and 79 years old, ask your  health care provider if you should take aspirin to prevent strokes.  Have regular diabetes screenings. This involves taking a blood sample to check your fasting blood sugar level. ? If you are at a normal weight and have a low risk for diabetes, have this test once every three years after 71 years of age. ? If you are overweight and have a high risk for diabetes, consider being tested at a younger age or more often. Preventing infection Hepatitis B  If you have a higher risk for hepatitis B, you should be screened for this virus. You are considered at high risk for hepatitis B if: ? You were born in a country where hepatitis B is common. Ask your health care provider which countries are considered high risk. ? Your parents were born in a high-risk country, and you have not been immunized against hepatitis B (hepatitis B vaccine). ? You have HIV or AIDS. ? You use needles to inject street drugs. ? You live with someone who has hepatitis B. ? You have had sex with someone who has hepatitis B. ? You get hemodialysis treatment. ? You take certain medicines for conditions, including cancer, organ transplantation, and autoimmune conditions.  Hepatitis C  Blood testing is recommended for: ? Everyone born from 1945 through 1965. ? Anyone with known risk factors for hepatitis C.  Sexually transmitted infections (STIs)  You should be screened for sexually transmitted infections (STIs) including gonorrhea and chlamydia if: ? You are sexually active and are younger than 71 years of age. ? You are older than 71 years of age and your health care provider tells you that you are at risk for this type of infection. ? Your sexual activity has changed since you were last screened and you are at an increased risk for chlamydia or gonorrhea. Ask your health care provider if you are at risk.  If you do not have HIV, but are at risk, it may be recommended that you take a prescription medicine daily to  prevent HIV infection. This is called pre-exposure prophylaxis (PrEP). You are considered at risk if: ? You are sexually active and do not regularly use condoms or know the HIV status of your partner(s). ? You take drugs by injection. ? You are sexually active with a partner who has HIV.  Talk with your health care provider about whether you are at high   risk of being infected with HIV. If you choose to begin PrEP, you should first be tested for HIV. You should then be tested every 3 months for as long as you are taking PrEP. Pregnancy  If you are premenopausal and you may become pregnant, ask your health care provider about preconception counseling.  If you may become pregnant, take 400 to 800 micrograms (mcg) of folic acid every day.  If you want to prevent pregnancy, talk to your health care provider about birth control (contraception). Osteoporosis and menopause  Osteoporosis is a disease in which the bones lose minerals and strength with aging. This can result in serious bone fractures. Your risk for osteoporosis can be identified using a bone density scan.  If you are 70 years of age or older, or if you are at risk for osteoporosis and fractures, ask your health care provider if you should be screened.  Ask your health care provider whether you should take a calcium or vitamin D supplement to lower your risk for osteoporosis.  Menopause may have certain physical symptoms and risks.  Hormone replacement therapy may reduce some of these symptoms and risks. Talk to your health care provider about whether hormone replacement therapy is right for you. Follow these instructions at home:  Schedule regular health, dental, and eye exams.  Stay current with your immunizations.  Do not use any tobacco products including cigarettes, chewing tobacco, or electronic cigarettes.  If you are pregnant, do not drink alcohol.  If you are breastfeeding, limit how much and how often you drink  alcohol.  Limit alcohol intake to no more than 1 drink per day for nonpregnant women. One drink equals 12 ounces of beer, 5 ounces of wine, or 1 ounces of hard liquor.  Do not use street drugs.  Do not share needles.  Ask your health care provider for help if you need support or information about quitting drugs.  Tell your health care provider if you often feel depressed.  Tell your health care provider if you have ever been abused or do not feel safe at home. This information is not intended to replace advice given to you by your health care provider. Make sure you discuss any questions you have with your health care provider. Document Released: 07/25/2010 Document Revised: 06/17/2015 Document Reviewed: 10/13/2014 Elsevier Interactive Patient Education  [employer].

## 2017-07-04 DIAGNOSIS — Z6829 Body mass index (BMI) 29.0-29.9, adult: Secondary | ICD-10-CM | POA: Diagnosis not present

## 2017-07-04 DIAGNOSIS — Z01419 Encounter for gynecological examination (general) (routine) without abnormal findings: Secondary | ICD-10-CM | POA: Diagnosis not present

## 2017-07-04 DIAGNOSIS — N958 Other specified menopausal and perimenopausal disorders: Secondary | ICD-10-CM | POA: Diagnosis not present

## 2017-07-18 ENCOUNTER — Other Ambulatory Visit: Payer: Self-pay | Admitting: Internal Medicine

## 2017-08-07 ENCOUNTER — Other Ambulatory Visit: Payer: Self-pay | Admitting: Internal Medicine

## 2017-08-07 DIAGNOSIS — Z1231 Encounter for screening mammogram for malignant neoplasm of breast: Secondary | ICD-10-CM

## 2017-08-07 NOTE — Progress Notes (Addendum)
Subjective:   Cathy Meyers is a 72 y.o. female who presents for Medicare Annual (Subsequent) preventive examination.  NO ROS; Medicare Wellness Visit Was adopted so does not know family hx   Health is pretty good D&C spotting  Spouse of 7 years has DM  2 children and her spouse has one  Just got back from cruise  Niece got married in Feb [place]  Multiple spotting and had d and c by dr. Langston Meyers    Regular exercise  Works in patient discharge 2 days a week Lots of walking  - 3 hours takes 20 people  Exercise  and listens to [employer]; Conservation officer, historic buildings    Diet BMI 30.1 Chol/hdl 3; hdl 62; trig 80  Spouse is DM Does like vegetables; Breakfast; juice, coffee;  Lunch; beets or salad Dinner; frozen dinner  Eats fruit between meals   Borderline anemic       Health Maintenance Due  Topic Date Due  . TETANUS/TDAP  01/23/2014   Dexa in 5 years per Cathy Meyers unless high risk Below -1 and 10% risk above young adult Went back to [facility] It was normal   Mammogram due in August -  Pap 05/2016 - D and C in Feb pelvic in April  Tdap is overdue   Colonoscopy 08/2016 due 10 year      Objective:     Vitals: BP 92/60   Pulse 74   Ht 5' 3.5" (1.613 m)   Wt 170 lb 5 oz (77.3 kg)   SpO2 98%   BMI 29.70 kg/m   Body mass index is 29.7 kg/m.  Advanced Directives 02/28/2017 02/25/2016  Does Patient Have a Medical Advance Directive? No (No Data)  Would patient like information on creating a medical advance directive? Yes (MAU/Ambulatory/Procedural Areas - Information given) -   Has her completed   Tobacco Social History   Tobacco Use  Smoking Status Former Smoker  . Packs/day: 0.10  . Types: Cigarettes  . Last attempt to quit: 1998  . Years since quitting: 21.5  Smokeless Tobacco Never Used  Tobacco Comment   smoked in school periodically      Counseling given: Not Answered Comment: smoked in school periodically    Clinical  Intake:     Past Medical History:  Diagnosis Date  . Allergy   . Anemia   . Chronic kidney disease   . Gastritis    EGD neg bx 2011   . Headache    otc med prn  . Hiatal hernia    small on egd  . History of varicella   . Hx: UTI (urinary tract infection)    pyelo  . Hyperlipidemia    diet controlled   . Pyelonephritis 06/04/2010  . Shingles 11/24/2011   Classic typical symptoms expectant management and treatment begin antiviral. Discussed pain control options   . Sickle cell trait (HCC)   . SVD (spontaneous vaginal delivery)    x 3   Past Surgical History:  Procedure Laterality Date  . COLONOSCOPY    . DILATION AND CURETTAGE OF UTERUS  06/2016  . HYSTEROSCOPY W/D&C N/A 03/02/2017   Procedure: DILATATION AND CURETTAGE /HYSTEROSCOPY;  Surgeon: Cathy Honour, DO;  Location: [facility];  Service: Gynecology;  Laterality: N/A;  . UPPER GASTROINTESTINAL ENDOSCOPY    . WISDOM TOOTH EXTRACTION     Family History  Adopted: Yes  Problem Relation Age of Onset  . Hypertension Mother  family hx  . Heart failure Mother   . Colon cancer Neg Hx   . Colon polyps Neg Hx   . Esophageal cancer Neg Hx   . Rectal cancer Neg Hx   . Stomach cancer Neg Hx    Social History   Socioeconomic History  . Marital status: Married    Spouse name: Not on file  . Number of children: Not on file  . Years of education: Not on file  . Highest education level: Not on file  Occupational History  . Not on file  Social Needs  . Financial resource strain: Not on file  . Food insecurity:    Worry: Not on file    Inability: Not on file  . Transportation needs:    Medical: Not on file    Non-medical: Not on file  Tobacco Use  . Smoking status: Former Smoker    Packs/day: 0.10    Types: Cigarettes    Last attempt to quit: 1998    Years since quitting: 21.5  . Smokeless tobacco: Never Used  . Tobacco comment: smoked in school periodically   Substance and Sexual Activity  . Alcohol use: Yes      Comment: socially  . Drug use: No  . Sexual activity: Not Currently    Birth control/protection: Post-menopausal  Lifestyle  . Physical activity:    Days per week: Not on file    Minutes per session: Not on file  . Stress: Not on file  Relationships  . Social connections:    Talks on phone: Not on file    Gets together: Not on file    Attends religious service: Not on file    Active member of club or organization: Not on file    Attends meetings of clubs or organizations: Not on file    Relationship status: Not on file  Other Topics Concern  . 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 fluticasone (FLONASE) 50 MCG/ACT nasal spray PLACE 2 SPRAYS INTO BOTH NOSTRILS DAILY.  Marland Kitchen. ibuprofen (ADVIL,MOTRIN) 600 MG tablet Take 1 tablet (600 mg total) by mouth every 6 (six) hours as needed.  . Multiple Vitamin (MULTIVITAMIN) tablet Take 1 tablet by mouth daily.    . naproxen sodium (ALEVE) 220 MG tablet Take 440 mg by mouth daily as needed (for headache).  Marland Kitchen. oxycodone-acetaminophen (PERCOCET) 2.5-325 MG tablet Take 1-2 tablets by mouth every 4 (four) hours as needed for pain. (Patient not taking: Reported on 08/08/2017)   No facility-administered encounter medications on file as of 08/08/2017.     Activities of Daily Living In your present state of health, do you have any difficulty performing the following activities: 04/18/2017 03/02/2017  Hearing? N N  Vision? N N  Difficulty concentrating or making decisions? N N  Walking or climbing stairs? N N  Dressing or bathing? N N  Doing errands, shopping? N -  Some recent data might be hidden    Patient Care Team: Cathy Meyers, Neta MendsWanda K, MD as PCP - General Cathy Meyers, Cathy T, MD as  Consulting Physician (Gastroenterology) Cathy HonourMorris, Megan, DO as  Consulting Physician (Obstetrics and Gynecology)    Assessment:   This is a routine wellness examination for Cathy Meyers.  Exercise Activities and Dietary recommendations    Goals    . Weight (lb) < 165 lb (74.8 kg)     Portion control;  Try to keep sodium around 2500mg  per day   Check out  online nutrition programs as WikiBlast.com.cy and LimitLaws.com.cy; fit63me; Look for foods with "whole" wheat; bran; oatmeal etc Shot at the farmer's markets in season for fresher choices  Watch for "hydrogenated" on the label of oils which are trans-fats.  Watch for "high fructose corn syrup" in snacks, yogurt or ketchup  Meats have less marbling; bright colored fruits and vegetables;  Canned; dump out liquid and wash vegetables. Be mindful of what we are eating  Portion control is essential to a health weight! Sit down; take a break and enjoy your meal; take smaller bites; put the fork down between bites;  It takes 20 minutes to get full; so check in with your fullness cues and stop eating when you start to fill full              Fall Risk Fall Risk  04/18/2017 03/01/2016 02/25/2016 03/02/2015 10/08/2013  Falls in the past year? No No No No No     Depression Screen PHQ 2/9 Scores 04/18/2017 03/01/2016 02/25/2016 03/02/2015  PHQ - 2 Score 0 0 0 0     Cognitive Function   Ad8 score reviewed for issues:  Issues making decisions:  Less interest in hobbies / activities:  Repeats questions, stories (family complaining):  Trouble using ordinary gadgets (microwave, computer, phone):  Forgets the month or year:   Mismanaging finances:   Remembering appts:  Daily problems with thinking and/or memory: Ad8 score is=0          Immunization History  Administered Date(s) Administered  . Influenza Split 11/21/2011, 10/24/2013  . Influenza, High Dose Seasonal PF 09/24/2015, 10/05/2016  . Influenza,inj,Quad PF,6+ Mos 10/25/2012,  10/09/2014  . Pneumococcal Conjugate-13 10/08/2013  . Pneumococcal Polysaccharide-23 08/14/2012  . Td 01/24/2004  . Zoster 05/02/2011     Screening Tests Health Maintenance  Topic Date Due  . TETANUS/TDAP  01/23/2014  . INFLUENZA VACCINE  08/23/2017  . MAMMOGRAM  09/13/2018  . COLONOSCOPY  09/15/2026  . DEXA SCAN  Completed  . Hepatitis C Screening  Completed  . PNA vac Low Risk Adult  Completed         Plan:      PCP Notes   Health Maintenance  Went back to [facility] Dexa repeated there in April and it was "normal"   Mammogram due in August -  Pap 05/2016 - D and C in Feb for spotting  By Dr.  Langston Meyers  No cancer Pelvic in April  Tdap is overdue and educated;  May take at pharmacy or as needed   Colonoscopy 08/2016 due 10 year     Abnormal Screens  none  Referrals  none  Patient concerns; She got the zoster 2013 and had the shingles in 2014 States she  still has some itching where she had shingles and uses anti-itch cream and it resolves, comes back every week or two. No other sensations or pain; no recurrent blisters  She had this itching with her blisters when acute but no pain when dx   Told her to call or come in for OV if associated with pain, or if itching becomes worse or if blisters  appear States she "feels  Like it is coming back but it does not"   Some stress with spouse who has DM and other issues and not as active     Nurse Concerns; As noted   Next PCP ; was seen in March  And TBS       I have personally reviewed and noted the following in the patient's chart:   . Medical and social history . Use of alcohol, tobacco or illicit drugs  . Current medications and supplements . Functional ability and status . Nutritional status . Physical activity . Advanced directives . List of other physicians . Hospitalizations, surgeries, and ER visits in previous 12 months . Vitals . Screenings to include  cognitive, depression, and falls . Referrals and appointments  In addition, I have reviewed and discussed with patient certain preventive protocols, quality metrics, and best practice recommendations. A written personalized care plan for preventive services as well as general preventive health recommendations were provided to patient.     Nassir Neidert, RN  08/08/2017   Above noted reviewed and agree. Berniece Andreas, MD

## 2017-08-08 ENCOUNTER — Ambulatory Visit (INDEPENDENT_AMBULATORY_CARE_PROVIDER_SITE_OTHER): Payer: PPO

## 2017-08-08 VITALS — BP 92/60 | HR 74 | Ht 63.5 in | Wt 170.3 lb

## 2017-08-08 DIAGNOSIS — Z0000 Encounter for general adult medical examination without abnormal findings: Secondary | ICD-10-CM | POA: Diagnosis not present

## 2017-08-08 NOTE — Patient Instructions (Addendum)
Cathy Meyers , Thank you for taking time to come for your Medicare Wellness Visit. I appreciate your ongoing commitment to your health goals. Please review the following plan we discussed and let me know if I can assist you in the future.   A Tetanus is recommended every 10 years. Medicare covers a tetanus if you have a cut or wound; otherwise, there may be a charge. If you had not had a tetanus with pertusses, known as the Tdap, you can take this anytime.   You had the shingles in 2013; Zoster Educated to check with insurance regarding coverage of Shingles vaccination on Part D or Part B and may have lower co-pay if provided on the Part D side Thinks she had her first shingrix at March - [facility] [address]  Please check with [facility] for date of your first injection; and ask when you will take your 2nd injection    These are the goals we discussed: Goals    . Weight (lb) < 165 lb (74.8 kg)     Portion control;  Try to keep sodium around '2500mg'$  per day   Check out  online nutrition programs as GumSearch.nl and http://vang.com/; fit81m; Look for foods with "whole" wheat; bran; oatmeal etc Shot at the farmer's markets in season for fresher choices  Watch for "hydrogenated" on the label of oils which are trans-fats.  Watch for "high fructose corn syrup" in snacks, yogurt or ketchup  Meats have less marbling; bright colored fruits and vegetables;  Canned; dump out liquid and wash vegetables. Be mindful of what we are eating  Portion control is essential to a health weight! Sit down; take a break and enjoy your meal; take smaller bites; put the fork down between bites;  It takes 20 minutes to get full; so check in with your fullness cues and stop eating when you start to fill full              This is a list of the screening recommended for you and due dates:  Health Maintenance  Topic Date Due  . Tetanus Vaccine  01/23/2014  . Flu Shot  08/23/2017  . Mammogram  09/13/2018  .  Colon Cancer Screening  09/15/2026  . DEXA scan (bone density measurement)  Completed  .  Hepatitis C: One time screening is recommended by Center for Disease Control  (CDC) for  adults born from [birth year]through 1965.   Completed  . Pneumonia vaccines  Completed    Prevention of falls: Remove rugs or any tripping hazards in the home Use Non slip mats in bathtubs and showers Placing grab bars next to the toilet and or shower Placing handrails on both sides of the stair way Adding extra lighting in the home.   Personal safety issues reviewed:  1. Consider starting a community watch program [facility].  Changes batteries is smoke detector and/or carbon monoxide detector  3.  If you have firearms; keep them in a safe place 4.  Wear protection when in the sun; Always wear sunscreen or a hat; It is good to have your doctor check your skin annually or review any new areas of concern 5. Driving safety; Keep in the right lane; stay 3 car lengths behind the car in front of you on the highway; look 3 times prior to pulling out; carry your cell phone everywhere you go!   Health Maintenance, Female Adopting a healthy lifestyle and getting preventive care can go a long  way to promote health and wellness. Talk with your health care provider about what schedule of regular examinations is right for you. This is a good chance for you to check in with your provider about disease prevention and staying healthy. In between checkups, there are plenty of things you can do on your own. Experts have done a lot of research about which lifestyle changes and preventive measures are most likely to keep you healthy. Ask your health care provider for more information. Weight and diet Eat a healthy diet  Be sure to include plenty of vegetables, fruits, low-fat dairy products, and lean protein.  Do not eat a lot of foods high in solid fats, added sugars, or salt.  Get regular exercise. This is one of the  most important things you can do for your health. ? Most adults should exercise for at least 150 minutes each week. The exercise should increase your heart rate and make you sweat (moderate-intensity exercise). ? Most adults should also do strengthening exercises at least twice a week. This is in addition to the moderate-intensity exercise.  Maintain a healthy weight  Body mass index (BMI) is a measurement that can be used to identify possible weight problems. It estimates body fat based on height and weight. Your health care provider can help determine your BMI and help you achieve or maintain a healthy weight.  For females 74 years of age and older: ? A BMI below 18.5 is considered underweight. ? A BMI of 18.5 to 24.9 is normal. ? A BMI of 25 to 29.9 is considered overweight. ? A BMI of 30 and above is considered obese.  Watch levels of cholesterol and blood lipids  You should start having your blood tested for lipids and cholesterol at 71 years of age, then have this test every 5 years.  You may need to have your cholesterol levels checked more often if: ? Your lipid or cholesterol levels are high. ? You are older than 71 years of age. ? You are at high risk for heart disease.  Cancer screening Lung Cancer  Lung cancer screening is recommended for adults 19-44 years old who are at high risk for lung cancer because of a history of smoking.  A yearly low-dose CT scan of the lungs is recommended for people who: ? Currently smoke. ? Have quit within the past 15 years. ? Have at least a 30-pack-year history of smoking. A pack year is smoking an average of one pack of cigarettes a day for 1 year.  Yearly screening should continue until it has been 15 years since you quit.  Yearly screening should stop if you develop a health problem that would prevent you from having lung cancer treatment.  Breast Cancer  Practice breast self-awareness. This means understanding how your breasts  normally appear and feel.  It also means doing regular breast self-exams. Let your health care provider know about any changes, no matter how small.  If you are in your 20s or 30s, you should have a clinical breast exam (CBE) by a health care provider every 1-3 years as part of a regular health exam.  If you are 69 or older, have a CBE every year. Also consider having a breast X-ray (mammogram) every year.  If you have a family history of breast cancer, talk to your health care provider about genetic screening.  If you are at high risk for breast cancer, talk to your health care provider about having an MRI and  a mammogram every year.  Breast cancer gene (BRCA) assessment is recommended for women who have family members with BRCA-related cancers. BRCA-related cancers include: ? Breast. ? Ovarian. ? Tubal. ? Peritoneal cancers.  Results of the assessment will determine the need for genetic counseling and BRCA1 and BRCA2 testing.  Cervical Cancer Your health care provider may recommend that you be screened regularly for cancer of the pelvic organs (ovaries, uterus, and vagina). This screening involves a pelvic examination, including checking for microscopic changes to the surface of your cervix (Pap test). You may be encouraged to have this screening done every 3 years, beginning at age 61.  For women ages 34-65, health care providers may recommend pelvic exams and Pap testing every 3 years, or they may recommend the Pap and pelvic exam, combined with testing for human papilloma virus (HPV), every 5 years. Some types of HPV increase your risk of cervical cancer. Testing for HPV may also be done on women of any age with unclear Pap test results.  Other health care providers may not recommend any screening for nonpregnant women who are considered low risk for pelvic cancer and who do not have symptoms. Ask your health care provider if a screening pelvic exam is right for you.  If you have had  past treatment for cervical cancer or a condition that could lead to cancer, you need Pap tests and screening for cancer for at least 20 years after your treatment. If Pap tests have been discontinued, your risk factors (such as having a new sexual partner) need to be reassessed to determine if screening should resume. Some women have medical problems that increase the chance of getting cervical cancer. In these cases, your health care provider may recommend more frequent screening and Pap tests.  Colorectal Cancer  This type of cancer can be detected and often prevented.  Routine colorectal cancer screening usually begins at 71 years of age and continues through 71 years of age.  Your health care provider may recommend screening at an earlier age if you have risk factors for colon cancer.  Your health care provider may also recommend using home test kits to check for hidden blood in the stool.  A small camera at the end of a tube can be used to examine your colon directly (sigmoidoscopy or colonoscopy). This is done to check for the earliest forms of colorectal cancer.  Routine screening usually begins at age 56.  Direct examination of the colon should be repeated every 5-10 years through 71 years of age. However, you may need to be screened more often if early forms of precancerous polyps or small growths are found.  Skin Cancer  Check your skin from head to toe regularly.  Tell your health care provider about any new moles or changes in moles, especially if there is a change in a mole's shape or color.  Also tell your health care provider if you have a mole that is larger than the size of a pencil eraser.  Always use sunscreen. Apply sunscreen liberally and repeatedly throughout the day.  Protect yourself by wearing long sleeves, pants, a wide-brimmed hat, and sunglasses whenever you are outside.  Heart disease, diabetes, and high blood pressure  High blood pressure causes heart  disease and increases the risk of stroke. High blood pressure is more likely to develop in: ? People who have blood pressure in the high end of the normal range (130-139/85-89 mm Hg). ? People who are overweight or obese. ?  People who are African American.  If you are 75-59 years of age, have your blood pressure checked every 3-5 years. If you are 29 years of age or older, have your blood pressure checked every year. You should have your blood pressure measured twice-once when you are at a hospital or clinic, and once when you are not at a hospital or clinic. Record the average of the two measurements. To check your blood pressure when you are not at a hospital or clinic, you can use: ? An automated blood pressure machine at a pharmacy. ? A home blood pressure monitor.  If you are between 67 years and 19 years old, ask your health care provider if you should take aspirin to prevent strokes.  Have regular diabetes screenings. This involves taking a blood sample to check your fasting blood sugar level. ? If you are at a normal weight and have a low risk for diabetes, have this test once every three years after 71 years of age. ? If you are overweight and have a high risk for diabetes, consider being tested at a younger age or more often. Preventing infection Hepatitis B  If you have a higher risk for hepatitis B, you should be screened for this virus. You are considered at high risk for hepatitis B if: ? You were born in a country where hepatitis B is common. Ask your health care provider which countries are considered high risk. ? Your parents were born in a high-risk country, and you have not been immunized against hepatitis B (hepatitis B vaccine). ? You have HIV or AIDS. ? You use needles to inject street drugs. ? You live with someone who has hepatitis B. ? You have had sex with someone who has hepatitis B. ? You get hemodialysis treatment. ? You take certain medicines for conditions,  including cancer, organ transplantation, and autoimmune conditions.  Hepatitis C  Blood testing is recommended for: ? Everyone born from [birth year] through 1964. ? Anyone with known risk factors for hepatitis C.  Sexually transmitted infections (STIs)  You should be screened for sexually transmitted infections (STIs) including gonorrhea and chlamydia if: ? You are sexually active and are younger than 71 years of age. ? You are older than 71 years of age and your health care provider tells you that you are at risk for this type of infection. ? Your sexual activity has changed since you were last screened and you are at an increased risk for chlamydia or gonorrhea. Ask your health care provider if you are at risk.  If you do not have HIV, but are at risk, it may be recommended that you take a prescription medicine daily to prevent HIV infection. This is called pre-exposure prophylaxis (PrEP). You are considered at risk if: ? You are sexually active and do not regularly use condoms or know the HIV status of your partner(s). ? You take drugs by injection. ? You are sexually active with a partner who has HIV.  Talk with your health care provider about whether you are at high risk of being infected with HIV. If you choose to begin PrEP, you should first be tested for HIV. You should then be tested every 3 months for as long as you are taking PrEP. Pregnancy  If you are premenopausal and you may become pregnant, ask your health care provider about preconception counseling.  If you may become pregnant, take 400 to 800 micrograms (mcg) of folic acid every day.  If you  want to prevent pregnancy, talk to your health care provider about birth control (contraception). Osteoporosis and menopause  Osteoporosis is a disease in which the bones lose minerals and strength with aging. This can result in serious bone fractures. Your risk for osteoporosis can be identified using a bone density scan.  If you are  14 years of age or older, or if you are at risk for osteoporosis and fractures, ask your health care provider if you should be screened.  Ask your health care provider whether you should take a calcium or vitamin D supplement to lower your risk for osteoporosis.  Menopause may have certain physical symptoms and risks.  Hormone replacement therapy may reduce some of these symptoms and risks. Talk to your health care provider about whether hormone replacement therapy is right for you. Follow these instructions at home:  Schedule regular health, dental, and eye exams.  Stay current with your immunizations.  Do not use any tobacco products including cigarettes, chewing tobacco, or electronic cigarettes.  If you are pregnant, do not drink alcohol.  If you are breastfeeding, limit how much and how often you drink alcohol.  Limit alcohol intake to no more than 1 drink per day for nonpregnant women. One drink equals 12 ounces of beer, 5 ounces of wine, or 1 ounces of hard liquor.  Do not use street drugs.  Do not share needles.  Ask your health care provider for help if you need support or information about quitting drugs.  Tell your health care provider if you often feel depressed.  Tell your health care provider if you have ever been abused or do not feel safe at home. This information is not intended to replace advice given to you by your health care provider. Make sure you discuss any questions you have with your health care provider. Document Released: 07/25/2010 Document Revised: 06/17/2015 Document Reviewed: 10/13/2014 Elsevier Interactive Patient Education  2018 [place] in the Home Falls can cause injuries and can affect people from all age groups. There are many simple things that you can do to make your home safe and to help prevent falls. What can I do on the outside of my home?  Regularly repair the edges of walkways and driveways and fix any  cracks.  Remove high doorway thresholds.  Trim any shrubbery on the main path into your home.  Use bright outdoor lighting.  Clear walkways of debris and clutter, including tools and rocks.  Regularly check that handrails are securely fastened and in good repair. Both sides of any steps should have handrails.  Install guardrails along the edges of any raised decks or porches.  Have leaves, snow, and ice cleared regularly.  Use sand or salt on walkways during winter months.  In the garage, clean up any spills right away, including grease or oil spills. What can I do in the bathroom?  Use night lights.  Install grab bars by the toilet and in the tub and shower. Do not use towel bars as grab bars.  Use non-skid mats or decals on the floor of the tub or shower.  If you need to sit down while you are in the shower, use a plastic, non-slip stool.  Keep the floor dry. Immediately clean up any water that spills on the floor.  Remove soap buildup in the tub or shower on a regular basis.  Attach bath mats securely with double-sided non-slip rug tape.  Remove throw rugs and  other tripping hazards from the floor. What can I do in the bedroom?  Use night lights.  Make sure that a bedside light is easy to reach.  Do not use oversized bedding that drapes onto the floor.  Have a firm chair that has side arms to use for getting dressed.  Remove throw rugs and other tripping hazards from the floor. What can I do in the kitchen?  Clean up any spills right away.  Avoid walking on wet floors.  Place frequently used items in easy-to-reach places.  If you need to reach for something above you, use a sturdy step stool that has a grab bar.  Keep electrical cables out of the way.  Do not use floor polish or wax that makes floors slippery. If you have to use wax, make sure that it is non-skid floor wax.  Remove throw rugs and other tripping hazards from the floor. What can I do in  the stairways?  Do not leave any items on the stairs.  Make sure that there are handrails on both sides of the stairs. Fix handrails that are broken or loose. Make sure that handrails are as long as the stairways.  Check any carpeting to make sure that it is firmly attached to the stairs. Fix any carpet that is loose or worn.  Avoid having throw rugs at the top or bottom of stairways, or secure the rugs with carpet tape to prevent them from moving.  Make sure that you have a light switch at the top of the stairs and the bottom of the stairs. If you do not have them, have them installed. What are some other fall prevention tips?  Wear closed-toe shoes that fit well and support your feet. Wear shoes that have rubber soles or low heels.  When you use a stepladder, make sure that it is completely opened and that the sides are firmly locked. Have someone hold the ladder while you are using it. Do not climb a closed stepladder.  Add color or contrast paint or tape to grab bars and handrails in your home. Place contrasting color strips on the first and last steps.  Use mobility aids as needed, such as canes, walkers, scooters, and crutches.  Turn on lights if it is dark. Replace any light bulbs that burn out.  Set up furniture so that there are clear paths. Keep the furniture in the same spot.  Fix any uneven floor surfaces.  Choose a carpet design that does not hide the edge of steps of a stairway.  Be aware of any and all pets.  Review your medicines with your healthcare provider. Some medicines can cause dizziness or changes in blood pressure, which increase your risk of falling. Talk with your health care provider about other ways that you can decrease your risk of falls. This may include working with a physical therapist or trainer to improve your strength, balance, and endurance. This information is not intended to replace advice given to you by your health care provider. Make sure  you discuss any questions you have with your health care provider. Document Released: 12/30/2001 Document Revised: 06/08/2015 Document Reviewed: 02/13/2014 Elsevier Interactive Patient Education  [employer].

## 2017-08-09 ENCOUNTER — Ambulatory Visit: Payer: Self-pay

## 2017-09-21 ENCOUNTER — Ambulatory Visit
Admission: RE | Admit: 2017-09-21 | Discharge: 2017-09-21 | Disposition: A | Discharge status: Home / Self Care | Payer: PPO | Source: Ambulatory Visit | Attending: Internal Medicine | Admitting: Internal Medicine

## 2017-09-21 DIAGNOSIS — Z1231 Encounter for screening mammogram for malignant neoplasm of breast: Secondary | ICD-10-CM | POA: Diagnosis not present

## 2018-01-14 ENCOUNTER — Other Ambulatory Visit: Payer: Self-pay | Admitting: Internal Medicine

## 2018-04-25 ENCOUNTER — Encounter: Payer: PPO | Admitting: Internal Medicine

## 2018-05-17 ENCOUNTER — Other Ambulatory Visit: Payer: Self-pay | Admitting: Internal Medicine

## 2018-06-20 ENCOUNTER — Other Ambulatory Visit: Payer: Self-pay | Admitting: Internal Medicine

## 2018-06-20 DIAGNOSIS — Z1231 Encounter for screening mammogram for malignant neoplasm of breast: Secondary | ICD-10-CM

## 2018-07-10 DIAGNOSIS — Z124 Encounter for screening for malignant neoplasm of cervix: Secondary | ICD-10-CM | POA: Diagnosis not present

## 2018-07-10 DIAGNOSIS — Z01419 Encounter for gynecological examination (general) (routine) without abnormal findings: Secondary | ICD-10-CM | POA: Diagnosis not present

## 2018-07-10 DIAGNOSIS — Z6831 Body mass index (BMI) 31.0-31.9, adult: Secondary | ICD-10-CM | POA: Diagnosis not present

## 2018-08-06 NOTE — Progress Notes (Signed)
Chief Complaint  Patient presents with  . Annual Exam    HPI: Cathy Meyers 73 y.o. comes in today for Preventive Medicare exam/ wellness visit .Since last visit. Doing well no major health change  Had  Gyne procedure and utd gyne had? dexa other    Health Maintenance  Topic Date Due  . TETANUS/TDAP  08/09/2018 (Originally 01/23/2014)  . INFLUENZA VACCINE  08/24/2018  . MAMMOGRAM  09/22/2019  . COLONOSCOPY  09/15/2026  . DEXA SCAN  Completed  . Hepatitis C Screening  Completed  . PNA vac Low Risk Adult  Completed   Health Maintenance Review LIFESTYLE:  Exercise:   Walks a lot    Tobacco/ETS:n Alcohol: n Sugar bever ages:no Sleep: 6  Drug use: no HH: 2  No pets  Husband  Diabetic    4 hours    Transportiung.   Back  Hospital    Hearing: ok  Vision:  No limitations at present . Last eye check UTD glasses   Safety:  Has smoke detector and wears seat belts.   No excess sun exposure. Sees dentist regularly.  Falls: n  Memory: Felt to be good  , no concern from her or her family.  Depression: No anhedonia unusual crying or depressive symptoms  Nutrition: Eats well balanced diet; adequate calcium and vitamin D. No swallowing chewing problems.  Injury: no major injuries in the last six months.  Other healthcare providers:  Reviewed today .  Preventive parameters: up-to-date  Reviewed   ADLS:   There are no problems or need for assistance  driving, feeding, obtaining food, dressing, toileting and bathing, managing money using phone. She is independent.    ROS:  GEN/ HEENT: No fever, significant weight changes sweats headaches vision problems hearing changes, CV/ PULM; No chest pain shortness of breath cough, syncope,edema  change in exercise tolerance. GI /GU: No adominal pain, vomiting, change in bowel habits. No blood in the stool. No significant GU symptoms. SKIN/HEME: ,no acute skin rashes suspicious lesions or bleeding. No lymphadenopathy, nodules, masses.   NEURO/ PSYCH:  No neurologic signs such as weakness numbness. No depression anxiety. IMM/ Allergy: No unusual infections.  Allergy .   REST of 12 system review negative except as per HPI   Past Medical History:  Diagnosis Date  . Allergy   . Anemia   . Chronic kidney disease   . Gastritis    EGD neg bx 2011   . Headache    otc med prn  . Hiatal hernia    small on egd  . History of varicella   . Hx: UTI (urinary tract infection)    pyelo  . Hyperlipidemia    diet controlled   . Pyelonephritis 06/04/2010  . Shingles 11/24/2011   Classic typical symptoms expectant management and treatment begin antiviral. Discussed pain control options   . Sickle cell trait ([place])   . SVD (spontaneous vaginal delivery)    x 3    Family History  Adopted: Yes  Problem Relation Age of Onset  . Hypertension Mother        family hx  . Heart failure Mother   . Colon cancer Neg Hx   . Colon polyps Neg Hx   . Esophageal cancer Neg Hx   . Rectal cancer Neg Hx   . Stomach cancer Neg Hx     Social History   Socioeconomic History  . Marital status: Married    Spouse name: Not on file  . Number  of children: Not on file  . Years of education: Not on file  . Highest education level: Not on file  Occupational History  . Not on file  Social Needs  . Financial resource strain: Not on file  . Food insecurity    Worry: Not on file    Inability: Not on file  . Transportation needs    Medical: Not on file    Non-medical: Not on file  Tobacco Use  . Smoking status: Former Smoker    Packs/day: 0.10    Types: Cigarettes    Quit date: 1998    Years since quitting: 22.5  . Smokeless tobacco: Never Used  . Tobacco comment: smoked in school periodically   Substance and Sexual Activity  . Alcohol use: Yes    Comment: socially  . Drug use: No  . Sexual activity: Not Currently    Birth control/protection: Post-menopausal  Lifestyle  . Physical activity    Days per week: Not on file    Minutes  per session: Not on file  . Stress: Not on file  Relationships  . Social Herbalist on phone: Not on file    Gets together: Not on file    Attends religious service: Not on file    Active member of club or organization: Not on file    Attends meetings of clubs or organizations: Not on file    Relationship status: Not on file  Other Topics Concern  . 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 fluticasone (FLONASE) 50 MCG/ACT nasal spray SPRAY 2 SPRAYS INTO EACH NOSTRIL EVERY DAY  . ibuprofen (ADVIL,MOTRIN) 600 MG tablet Take 1 tablet (600 mg total) by mouth every 6 (six) hours as needed.  . Multiple Vitamin (MULTIVITAMIN) tablet Take 1 tablet by mouth daily.    . naproxen sodium (ALEVE) 220 MG tablet Take 440 mg by mouth daily as needed (for headache).  . [DISCONTINUED] oxycodone-acetaminophen (PERCOCET) 2.5-325 MG tablet Take 1-2 tablets by mouth every 4 (four) hours as needed for pain.   No facility-administered encounter medications on file as of 08/07/2018.     EXAM:  BP 110/68   Pulse 75   Temp 98.4 F (36.9 C) (Oral)   Ht _0  (1.6 m)   Wt 176 lb 9.6 oz (80.1 kg)   SpO2 98%   BMI 31.28 kg/m   Body mass index is 31.28 kg/m.  Physical Exam: Vital signs reviewed VEL:FYBO is a well-developed well-nourished alert cooperative   who appears stated age in no acute distress.  HEENT: normocephalic atraumatic , Eyes: PERRL EOM's full, conjunctiva clear, Nares: paten,t no deformity discharge or tenderness., Ears: no deformity EAC's clear TMs with normal landmarks. Mouth: deferred masked NECK: supple without masses, thyromegaly or bruits. CHEST/PULM:  Clear to auscultation and percussion breath sounds  equal no wheeze , rales or rhonchi. No chest wall deformities or tenderness. CV: PMI is nondisplaced, S1 S2 no gallops, murmurs, rubs. Peripheral pulses are full without delay.No JVD .  ABDOMEN: Bowel sounds normal nontender  No guard or rebound, no hepato splenomegal  no CVA tenderness.   Extremtities:  No clubbing cyanosis or edema, no acute joint swelling or redness no focal atrophy puffy ankles but no swelling edema  NEURO:  Oriented x3, cranial nerves 3-12 appear to be intact, no obvious focal weakness,gait within normal limits no abnormal reflexes or asymmetrical SKIN: No acute rashes normal turgor, color, no bruising or petechiae. PSYCH: Oriented, good eye contact, no obvious depression anxiety, cognition and judgment appear normal. LN: no cervical axillary i adenopathy No noted deficits in memory, attention, and speech.   Lab Results  Component Value Date   WBC 6.1 02/28/2017   HGB 12.4 02/28/2017   HCT 36.6 02/28/2017   PLT 223 02/28/2017   GLUCOSE 91 02/28/2017   CHOL 203 (H) 04/18/2017   TRIG 80.0 04/18/2017   HDL 62.60 04/18/2017   LDLDIRECT 122.4 04/25/2011   LDLCALC 124 (H) 04/18/2017   ALT 10 02/23/2016   AST 14 02/23/2016   NA 138 02/28/2017   K 3.7 02/28/2017   CL 107 02/28/2017   CREATININE 0.82 02/28/2017   BUN 9 02/28/2017   CO2 23 02/28/2017   TSH 2.50 04/18/2017  ? Fasting?  ASSESSMENT AND PLAN:  Discussed the following assessment and plan:   ICD-10-CM   1. Visit for preventive health examination  Z00.00   2. Medication management  Z79.899 Lipid panel    TSH    Basic metabolic panel    CBC with Differential/Platelet  3. Elevated LDL cholesterol level  E78.00 Lipid panel    TSH    Basic metabolic panel    CBC with Differential/Platelet  4. Sickle cell trait (HCC)  D57.3 Lipid panel    TSH    Basic metabolic panel    CBC with Differential/Platelet  5. Need for shingles vaccine  Z23 Varicella-zoster vaccine IM     Patient Care Team: Anajah Sterbenz,  Standley Brooking, MD as PCP - General Ladene Artist, MD as Consulting Physician (Gastroenterology) Linda Hedges, DO as Consulting Physician (Obstetrics and Gynecology)  Patient Instructions  There are no preventive care reminders to display for this patient.  If all ok then yearly  Visit  Glad you are doing well.  ? Last dexa scan if you wish to repeat  Last on in record was 2014      Preventive Care 65 Years and Older, Female Preventive care refers to lifestyle choices and visits with your health care provider that can promote health and wellness. This includes:  A yearly physical exam. This is also called an annual well check.  Regular dental and eye exams.  Immunizations.  Screening for certain conditions.  Healthy lifestyle choices, such as diet and exercise. What can I expect for my preventive care visit? Physical exam Your health care provider will check:  Height and weight. These may be used to calculate body mass index (BMI), which is a measurement that tells if you are at a healthy weight.  Heart rate and blood pressure.  Your skin for abnormal spots. Counseling Your health care provider may ask you questions about:  Alcohol, tobacco, and drug use.  Emotional well-being.  Home and relationship well-being.  Sexual activity.  Eating habits.  History of falls.  Memory and ability to understand (cognition).  Work and work Statistician.  Pregnancy and menstrual history. What immunizations do I need?  Influenza (flu) vaccine  This is recommended every year. Tetanus, diphtheria, and pertussis (Tdap) vaccine  You may need a Td booster every 10 years. Varicella (chickenpox) vaccine  You  may need this vaccine if you have not already been vaccinated. Zoster (shingles) vaccine  You may need this after age 40. Pneumococcal conjugate (PCV13) vaccine  One dose is recommended after age 33. Pneumococcal polysaccharide (PPSV23) vaccine  One dose is  recommended after age 28. Measles, mumps, and rubella (MMR) vaccine  You may need at least one dose of MMR if you were born in 1957 or later. You may also need a second dose. Meningococcal conjugate (MenACWY) vaccine  You may need this if you have certain conditions. Hepatitis A vaccine  You may need this if you have certain conditions or if you travel or work in places where you may be exposed to hepatitis A. Hepatitis B vaccine  You may need this if you have certain conditions or if you travel or work in places where you may be exposed to hepatitis B. Haemophilus influenzae type b (Hib) vaccine  You may need this if you have certain conditions. You may receive vaccines as individual doses or as more than one vaccine together in one shot (combination vaccines). Talk with your health care provider about the risks and benefits of combination vaccines. What tests do I need? Blood tests  Lipid and cholesterol levels. These may be checked every 5 years, or more frequently depending on your overall health.  Hepatitis C test.  Hepatitis B test. Screening  Lung cancer screening. You may have this screening every year starting at age 85 if you have a 30-pack-year history of smoking and currently smoke or have quit within the past 15 years.  Colorectal cancer screening. All adults should have this screening starting at age 9 and continuing until age 68. Your health care provider may recommend screening at age 90 if you are at increased risk. You will have tests every 1-10 years, depending on your results and the type of screening test.  Diabetes screening. This is done by checking your blood sugar (glucose) after you have not eaten for a while (fasting). You may have this done every 1-3 years.  Mammogram. This may be done every 1-2 years. Talk with your health care provider about how often you should have regular mammograms.  BRCA-related cancer screening. This may be done if you have a  family history of breast, ovarian, tubal, or peritoneal cancers. Other tests  Sexually transmitted disease (STD) testing.  Bone density scan. This is done to screen for osteoporosis. You may have this done starting at age 72. Follow these instructions at home: Eating and drinking  Eat a diet that includes fresh fruits and vegetables, whole grains, lean protein, and low-fat dairy products. Limit your intake of foods with high amounts of sugar, saturated fats, and salt.  Take vitamin and mineral supplements as recommended by your health care provider.  Do not drink alcohol if your health care provider tells you not to drink.  If you drink alcohol: ? Limit how much you have to 0-1 drink a day. ? Be aware of how much alcohol is in your drink. In the [place] one drink equals one 12 oz bottle of beer (355 mL), one 5 oz glass of wine (148 mL), or one 1 oz glass of hard liquor (44 mL). Lifestyle  Take daily care of your teeth and gums.  Stay active. Exercise for at least 30 minutes on 5 or more days each week.  Do not use any products that contain nicotine or tobacco, such as cigarettes, e-cigarettes, and chewing tobacco. If you need help quitting, ask  your health care provider.  If you are sexually active, practice safe sex. Use a condom or other form of protection in order to prevent STIs (sexually transmitted infections).  Talk with your health care provider about taking a low-dose aspirin or statin. What's next?  Go to your health care provider once a year for a well check visit.  Ask your health care provider how often you should have your eyes and teeth checked.  Stay up to date on all vaccines. This information is not intended to replace advice given to you by your health care provider. Make sure you discuss any questions you have with your health care provider. Document Released: 02/05/2015 Document Revised: 01/03/2018 Document Reviewed: 01/03/2018 Elsevier Patient Education   2020 [place] M.D.

## 2018-08-07 ENCOUNTER — Encounter: Payer: Self-pay | Admitting: Internal Medicine

## 2018-08-07 ENCOUNTER — Ambulatory Visit (INDEPENDENT_AMBULATORY_CARE_PROVIDER_SITE_OTHER): Payer: PPO | Admitting: Internal Medicine

## 2018-08-07 VITALS — BP 110/68 | HR 75 | Temp 98.4°F | Ht 63.0 in | Wt 176.6 lb

## 2018-08-07 DIAGNOSIS — D573 Sickle-cell trait: Secondary | ICD-10-CM

## 2018-08-07 DIAGNOSIS — Z23 Encounter for immunization: Secondary | ICD-10-CM | POA: Diagnosis not present

## 2018-08-07 DIAGNOSIS — E7800 Pure hypercholesterolemia, unspecified: Secondary | ICD-10-CM | POA: Diagnosis not present

## 2018-08-07 DIAGNOSIS — Z79899 Other long term (current) drug therapy: Secondary | ICD-10-CM

## 2018-08-07 DIAGNOSIS — Z0000 Encounter for general adult medical examination without abnormal findings: Secondary | ICD-10-CM

## 2018-08-07 DIAGNOSIS — Z0289 Encounter for other administrative examinations: Secondary | ICD-10-CM

## 2018-08-07 LAB — LIPID PANEL
Cholesterol: 218 mg/dL — ABNORMAL HIGH (ref 0–200)
HDL: 76.1 mg/dL (ref >39.00)
LDL Cholesterol: 123 mg/dL — ABNORMAL HIGH (ref 0–99)
NonHDL: 141.84
Total CHOL/HDL Ratio: 3
Triglycerides: 95 mg/dL (ref 0.0–149.0)
VLDL: 19 mg/dL (ref 0.0–40.0)

## 2018-08-07 LAB — BASIC METABOLIC PANEL
BUN: 9 mg/dL (ref 6–23)
CO2: 28 mEq/L (ref 19–32)
Calcium: 9.3 mg/dL (ref 8.4–10.5)
Chloride: 107 mEq/L (ref 96–112)
Creatinine, Ser: 0.86 mg/dL (ref 0.40–1.20)
GFR: 78.48 mL/min (ref >60.00)
Glucose, Bld: 81 mg/dL (ref 70–99)
Potassium: 3.9 mEq/L (ref 3.5–5.1)
Sodium: 143 mEq/L (ref 135–145)

## 2018-08-07 LAB — CBC WITH DIFFERENTIAL/PLATELET
Basophils Absolute: 0 10*3/uL (ref 0.0–0.1)
Basophils Relative: 0.5 % (ref 0.0–3.0)
Eosinophils Absolute: 0.1 10*3/uL (ref 0.0–0.7)
Eosinophils Relative: 1.9 % (ref 0.0–5.0)
HCT: 37.5 % (ref 36.0–46.0)
Hemoglobin: 12.4 g/dL (ref 12.0–15.0)
Lymphocytes Relative: 30.2 % (ref 12.0–46.0)
Lymphs Abs: 2.2 10*3/uL (ref 0.7–4.0)
MCHC: 32.9 g/dL (ref 30.0–36.0)
MCV: 92.6 fl (ref 78.0–100.0)
Monocytes Absolute: 0.7 10*3/uL (ref 0.1–1.0)
Monocytes Relative: 9.3 % (ref 3.0–12.0)
Neutro Abs: 4.1 10*3/uL (ref 1.4–7.7)
Neutrophils Relative %: 58.1 % (ref 43.0–77.0)
Platelets: 267 10*3/uL (ref 150.0–400.0)
RBC: 4.06 Mil/uL (ref 3.87–5.11)
RDW: 13.8 % (ref 11.5–15.5)
WBC: 7.1 10*3/uL (ref 4.0–10.5)

## 2018-08-07 LAB — TSH: TSH: 3.16 u[IU]/mL (ref 0.35–4.50)

## 2018-08-07 NOTE — Patient Instructions (Addendum)
There are no preventive care reminders to display for this patient.  If all ok then yearly  Visit  Glad you are doing well.  ? Last dexa scan if you wish to repeat  Last on in record was 2014      Preventive Care 72 Years and Older, Female Preventive care refers to lifestyle choices and visits with your health care provider that can promote health and wellness. This includes:  A yearly physical exam. This is also called an annual well check.  Regular dental and eye exams.  Immunizations.  Screening for certain conditions.  Healthy lifestyle choices, such as diet and exercise. What can I expect for my preventive care visit? Physical exam Your health care provider will check:  Height and weight. These may be used to calculate body mass index (BMI), which is a measurement that tells if you are at a healthy weight.  Heart rate and blood pressure.  Your skin for abnormal spots. Counseling Your health care provider may ask you questions about:  Alcohol, tobacco, and drug use.  Emotional well-being.  Home and relationship well-being.  Sexual activity.  Eating habits.  History of falls.  Memory and ability to understand (cognition).  Work and work Statistician.  Pregnancy and menstrual history. What immunizations do I need?  Influenza (flu) vaccine  This is recommended every year. Tetanus, diphtheria, and pertussis (Tdap) vaccine  You may need a Td booster every 10 years. Varicella (chickenpox) vaccine  You may need this vaccine if you have not already been vaccinated. Zoster (shingles) vaccine  You may need this after age 72. Pneumococcal conjugate (PCV13) vaccine  One dose is recommended after age 72. Pneumococcal polysaccharide (PPSV23) vaccine  One dose is recommended after age 72. Measles, mumps, and rubella (MMR) vaccine  You may need at least one dose of MMR if you were born in 1957 or later. You may also need a second dose. Meningococcal  conjugate (MenACWY) vaccine  You may need this if you have certain conditions. Hepatitis A vaccine  You may need this if you have certain conditions or if you travel or work in places where you may be exposed to hepatitis A. Hepatitis B vaccine  You may need this if you have certain conditions or if you travel or work in places where you may be exposed to hepatitis B. Haemophilus influenzae type b (Hib) vaccine  You may need this if you have certain conditions. You may receive vaccines as individual doses or as more than one vaccine together in one shot (combination vaccines). Talk with your health care provider about the risks and benefits of combination vaccines. What tests do I need? Blood tests  Lipid and cholesterol levels. These may be checked every 5 years, or more frequently depending on your overall health.  Hepatitis C test.  Hepatitis B test. Screening  Lung cancer screening. You may have this screening every year starting at age 72 if you have a 30-pack-year history of smoking and currently smoke or have quit within the past 15 years.  Colorectal cancer screening. All adults should have this screening starting at age 72 and continuing until age 69. Your health care provider may recommend screening at age 50 if you are at increased risk. You will have tests every 1-10 years, depending on your results and the type of screening test.  Diabetes screening. This is done by checking your blood sugar (glucose) after you have not eaten for a while (fasting). You may have this done  every 1-3 years.  Mammogram. This may be done every 1-2 years. Talk with your health care provider about how often you should have regular mammograms.  BRCA-related cancer screening. This may be done if you have a family history of breast, ovarian, tubal, or peritoneal cancers. Other tests  Sexually transmitted disease (STD) testing.  Bone density scan. This is done to screen for osteoporosis. You may  have this done starting at age 72. Follow these instructions at home: Eating and drinking  Eat a diet that includes fresh fruits and vegetables, whole grains, lean protein, and low-fat dairy products. Limit your intake of foods with high amounts of sugar, saturated fats, and salt.  Take vitamin and mineral supplements as recommended by your health care provider.  Do not drink alcohol if your health care provider tells you not to drink.  If you drink alcohol: ? Limit how much you have to 0-1 drink a day. ? Be aware of how much alcohol is in your drink. In the [place] one drink equals one 12 oz bottle of beer (355 mL), one 5 oz glass of wine (148 mL), or one 1 oz glass of hard liquor (44 mL). Lifestyle  Take daily care of your teeth and gums.  Stay active. Exercise for at least 30 minutes on 5 or more days each week.  Do not use any products that contain nicotine or tobacco, such as cigarettes, e-cigarettes, and chewing tobacco. If you need help quitting, ask your health care provider.  If you are sexually active, practice safe sex. Use a condom or other form of protection in order to prevent STIs (sexually transmitted infections).  Talk with your health care provider about taking a low-dose aspirin or statin. What's next?  Go to your health care provider once a year for a well check visit.  Ask your health care provider how often you should have your eyes and teeth checked.  Stay up to date on all vaccines. This information is not intended to replace advice given to you by your health care provider. Make sure you discuss any questions you have with your health care provider. Document Released: 02/05/2015 Document Revised: 01/03/2018 Document Reviewed: 01/03/2018 Elsevier Patient Education  2020 [employer].

## 2018-08-12 ENCOUNTER — Ambulatory Visit: Payer: PPO

## 2018-09-24 ENCOUNTER — Other Ambulatory Visit: Payer: Self-pay

## 2018-09-24 ENCOUNTER — Ambulatory Visit
Admission: RE | Admit: 2018-09-24 | Discharge: 2018-09-24 | Disposition: A | Discharge status: Home / Self Care | Payer: Medicare Other | Source: Ambulatory Visit | Attending: Internal Medicine | Admitting: Internal Medicine

## 2018-09-24 DIAGNOSIS — Z1231 Encounter for screening mammogram for malignant neoplasm of breast: Secondary | ICD-10-CM | POA: Diagnosis not present

## 2018-10-08 ENCOUNTER — Ambulatory Visit: Payer: PPO

## 2018-11-11 DIAGNOSIS — H524 Presbyopia: Secondary | ICD-10-CM | POA: Diagnosis not present

## 2018-11-11 DIAGNOSIS — H25013 Cortical age-related cataract, bilateral: Secondary | ICD-10-CM | POA: Diagnosis not present

## 2019-07-24 NOTE — Progress Notes (Signed)
Chief Complaint  Patient presents with  . Annual Exam    Doing okay    HPI: Cathy Meyers 74 y.o. comes in today for Preventive Medicare exam/ wellness visit .Since last visit.  Pf[employer] vaccine in jan/ feb . Doing ok  X  bowels not the same since surgery  Always slow  no blood pain etc . Had gyne exam and all ok  Constipation  At times since surgerys  .  Takes a MOM or  Dieter tea 2 x per week  Is utd on colon screen  Health Maintenance  Topic Date Due  . COVID-19 Vaccine (1) Never done  . TETANUS/TDAP  01/23/2014  . INFLUENZA VACCINE  08/24/2019  . MAMMOGRAM  09/23/2020  . COLONOSCOPY  09/15/2026  . DEXA SCAN  Completed  . Hepatitis C Screening  Completed  . PNA vac Low Risk Adult  Completed   Health Maintenance Review LIFESTYLE:  Exercise:    Good  Tobacco/ETS:n Alcohol:  Rare  Sugar beverages: not reg  Sleep: about 6- 6.5  Drug use: no HH:  2 no pets  Transporting  hospital    Hearing: ok  Vision:  No limitations at present . Last eye check UTD early cataracts  glasses   Safety:  Has smoke detector and wears seat belts.   No excess sun exposure. Sees dentist regularly.  Falls:   n  Advance directive :  Reviewed  Not done yet.  Has paper  Talk to family .  Memory: Felt to be good  , no concern from her or her family.  Depression: No anhedonia unusual crying or depressive symptoms  Nutrition: Eats well balanced diet; adequate calcium and vitamin D. No swallowing chewing problems.  Injury: no major injuries in the last six months.  Other healthcare providers:  Reviewed today .  Preventive parameters: up-to-date  Reviewed   ADLS:   There are no problems or need for assistance  driving, feeding, obtaining food, dressing, toileting and bathing, managing money using phone. She is independent. Husband has health issues .    ROS:  GEN/ HEENT: No fever, significant weight changes sweats headaches vision problems hearing changes, CV/ PULM; No chest  pain shortness of breath cough, syncope,edema  change in exercise tolerance. GI /GU: No adominal pain, vomiting, change in bowel habits. No blood in the stool. No significant GU symptoms. SKIN/HEME: ,no acute skin rashes suspicious lesions or bleeding. No lymphadenopathy, nodules, masses.  NEURO/ PSYCH:  No neurologic signs such as weakness numbness. No depression anxiety. IMM/ Allergy: No unusual infections.  Allergy .   REST of 12 system review negative except as per HPI   Past Medical History:  Diagnosis Date  . Allergy   . Anemia   . Chronic kidney disease   . Gastritis    EGD neg bx 2011   . Headache    otc med prn  . Hiatal hernia    small on egd  . History of varicella   . Hx: UTI (urinary tract infection)    pyelo  . Hyperlipidemia    diet controlled   . Pyelonephritis 06/04/2010  . Shingles 11/24/2011   Classic typical symptoms expectant management and treatment begin antiviral. Discussed pain control options   . Sickle cell trait (HCC)   . SVD (spontaneous vaginal delivery)    x 3    Family History  Adopted: Yes  Problem Relation Age of Onset  . Hypertension Mother  family hx  . Heart failure Mother   . Colon cancer Neg Hx   . Colon polyps Neg Hx   . Esophageal cancer Neg Hx   . Rectal cancer Neg Hx   . Stomach cancer Neg Hx     Social History   Socioeconomic History  . Marital status: Married    Spouse name: Not on file  . Number of children: Not on file  . Years of education: Not on file  . Highest education level: Not on file  Occupational History  . Not on file  Tobacco Use  . Smoking status: Former Smoker    Packs/day: 0.10    Types: Cigarettes    Quit date: 1998    Years since quitting: 23.5  . Smokeless tobacco: Never Used  . Tobacco comment: smoked in school periodically   Vaping Use  . Vaping Use: Never used  Substance and Sexual Activity  . Alcohol use: Yes    Comment: socially  . Drug use: No  . Sexual activity: Not  Currently    Birth control/protection: Post-menopausal  Other Topics Concern  . 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 Difficulty of Paying Living Expenses:   Food Insecurity:   . Worried About Programme researcher, broadcasting/film/video in the Last Year:   . Barista in the Last Year:   Transportation Needs:   . Freight forwarder (Medical):   Marland Kitchen Lack of Transportation (Non-Medical):   Physical Activity:   . Days of Exercise per Week:   . Minutes of Exercise per Session:   Stress:   . Feeling of Stress :   Social Connections:   . Frequency of Communication with Friends and Family:   . Frequency of Social Gatherings with Friends and Family:   . Attends Religious Services:   . Active Member of Clubs or Organizations:   . Attends Banker Meetings:   Marland Kitchen Marital Status:     Outpatient Encounter Medications as of 07/25/2019  Medication Sig  . Ascorbic Acid (VITAMIN C) 1000 MG tablet Vitamin C  . Cholecalciferol (VITAMIN D) 50 MCG (2000 UT) CAPS Vitamin D  . fluticasone (FLONASE) 50 MCG/ACT nasal spray SPRAY 2 SPRAYS INTO EACH NOSTRIL EVERY DAY  . ibuprofen (ADVIL,MOTRIN) 600 MG tablet Take 1 tablet (600 mg total) by mouth every 6 (six) hours as needed.  . Multiple Vitamin (MULTIVITAMIN) tablet Take 1 tablet by mouth daily.    . naproxen sodium (ALEVE) 220 MG tablet Take 440 mg by mouth daily as needed (for headache).   No facility-administered encounter medications on file as of 07/25/2019.    EXAM:  BP 112/72   Pulse 71   Temp 97.7 F (36.5 C) (Temporal)   Ht 5' 2.75" (1.594 m)   Wt 178 lb 3.2 oz (80.8 kg)   SpO2 98%   BMI 31.82 kg/m   Body mass index is 31.82 kg/m.  Physical Exam: Vital signs  reviewed ZOX:WRUE is a well-developed well-nourished alert cooperative   who appears stated age in no acute distress.  HEENT: normocephalic atraumatic ,  Eyes: PERRL EOM's full, conjunctiva clear, Nares: paten,t no deformity discharge or tenderness., Ears: no deformity EAC's clear TMs with normal landmarks. Mouth:masked  NECK: supple without masses, thyromegaly or bruits. CHEST/PULM:  Clear to auscultation and percussion breath sounds equal no wheeze , rales or rhonchi. No chest wall deformities or tenderness. Breast: normal by inspection . No dimpling, discharge, masses, tenderness or discharge . CV: PMI is nondisplaced, S1 S2 no gallops, murmurs, rubs. Peripheral pulses are full without delay.No JVD .  ABDOMEN: Bowel sounds normal nontender  No guard or rebound, no hepato splenomegal no CVA tenderness.   Extremtities:  No clubbing cyanosis or edema, no acute joint swelling or redness no focal atrophy NEURO:  Oriented x3, cranial nerves 3-12 appear to be intact, no obvious focal weakness,gait within normal limits no abnormal reflexes or asymmetrical SKIN: No acute rashes normal turgor, color, no bruising or petechiae. PSYCH: Oriented, good eye contact, no obvious depression anxiety, cognition and judgment appear normal. LN: no cervical axillary inguinal adenopathy No noted deficits in memory, attention, and speech.   Lab Results  Component Value Date   WBC 7.1 08/07/2018   HGB 12.4 08/07/2018   HCT 37.5 08/07/2018   PLT 267.0 08/07/2018   GLUCOSE 81 08/07/2018   CHOL 218 (H) 08/07/2018   TRIG 95.0 08/07/2018   HDL 76.10 08/07/2018   LDLDIRECT 122.4 04/25/2011   LDLCALC 123 (H) 08/07/2018   ALT 10 02/23/2016   AST 14 02/23/2016   NA 143 08/07/2018   K 3.9 08/07/2018   CL 107 08/07/2018   CREATININE 0.86 08/07/2018   BUN 9 08/07/2018   CO2 28 08/07/2018   TSH 3.16 08/07/2018    ASSESSMENT AND PLAN:  Discussed the following assessment and plan:  Visit for preventive health  examination  Elevated LDL cholesterol level - Plan: Basic metabolic panel, CBC with Differential/Platelet, Hepatic function panel, Lipid panel, TSH  Constipation, unspecified constipation type - Plan: Basic metabolic panel, CBC with Differential/Platelet, Hepatic function panel, Lipid panel, TSH  Medication management - Plan: Basic metabolic panel, CBC with Differential/Platelet, Hepatic function panel, Lipid panel, TSH Lipids  Fu and follow u with gi of progresive gi concerns   Last dexa 2014  In system  Has gyne Patient Care Team: Madelin HeadingsPanosh, Lemon Sternberg K, MD as PCP - General Meryl DareStark, Malcolm T, MD as Consulting Physician (Gastroenterology) Mitchel HonourMorris, Megan, DO as Consulting Physician (Obstetrics and Gynecology)  Patient Instructions  Continue lifestyle intervention healthy eating and exercise .   Can try miralax also  As needed for constipation.   If getting worse  Can see GI team for evaluation.    Will notify you  of labs when available.     Health Maintenance, Female Adopting a healthy lifestyle and getting preventive care are important in promoting health and wellness. Ask your health care provider about:  The right schedule for you to have regular tests and exams.  Things you can do on your own to prevent diseases and keep yourself healthy. What should I know about diet, weight, and exercise? Eat a healthy diet   Eat a diet that includes plenty of vegetables, fruits, low-fat dairy products, and lean protein.  Do not eat a lot of foods that are high in solid fats, added sugars, or sodium. Maintain a healthy weight Body mass index (BMI) is used to identify weight problems. It estimates body fat based on height and weight. Your health care provider can help determine your BMI and help you achieve or maintain a  healthy weight. Get regular exercise Get regular exercise. This is one of the most important things you can do for your health. Most adults should:  Exercise for at least  150 minutes each week. The exercise should increase your heart rate and make you sweat (moderate-intensity exercise).  Do strengthening exercises at least twice a week. This is in addition to the moderate-intensity exercise.  Spend less time sitting. Even light physical activity can be beneficial. Watch cholesterol and blood lipids Have your blood tested for lipids and cholesterol at 74 years of age, then have this test every 5 years. Have your cholesterol levels checked more often if:  Your lipid or cholesterol levels are high.  You are older than 74 years of age.  You are at high risk for heart disease. What should I know about cancer screening? Depending on your health history and family history, you may need to have cancer screening at various ages. This may include screening for:  Breast cancer.  Cervical cancer.  Colorectal cancer.  Skin cancer.  Lung cancer. What should I know about heart disease, diabetes, and high blood pressure? Blood pressure and heart disease  High blood pressure causes heart disease and increases the risk of stroke. This is more likely to develop in people who have high blood pressure readings, are of African descent, or are overweight.  Have your blood pressure checked: ? Every 3-5 years if you are 18-70 years of age. ? Every year if you are 44 years old or older. Diabetes Have regular diabetes screenings. This checks your fasting blood sugar level. Have the screening done:  Once every three years after age 71 if you are at a normal weight and have a low risk for diabetes.  More often and at a younger age if you are overweight or have a high risk for diabetes. What should I know about preventing infection? Hepatitis B If you have a higher risk for hepatitis B, you should be screened for this virus. Talk with your health care provider to find out if you are at risk for hepatitis B infection. Hepatitis C Testing is recommended for:  Everyone  born from [birth year] through 1964.  Anyone with known risk factors for hepatitis C. Sexually transmitted infections (STIs)  Get screened for STIs, including gonorrhea and chlamydia, if: ? You are sexually active and are younger than 73 years of age. ? You are older than 73 years of age and your health care provider tells you that you are at risk for this type of infection. ? Your sexual activity has changed since you were last screened, and you are at increased risk for chlamydia or gonorrhea. Ask your health care provider if you are at risk.  Ask your health care provider about whether you are at high risk for HIV. Your health care provider may recommend a prescription medicine to help prevent HIV infection. If you choose to take medicine to prevent HIV, you should first get tested for HIV. You should then be tested every 3 months for as long as you are taking the medicine. Pregnancy  If you are about to stop having your period (premenopausal) and you may become pregnant, seek counseling before you get pregnant.  Take 400 to 800 micrograms (mcg) of folic acid every day if you become pregnant.  Ask for birth control (contraception) if you want to prevent pregnancy. Osteoporosis and menopause Osteoporosis is a disease in which the bones lose minerals and strength with aging. This can result  in bone fractures. If you are 10 years old or older, or if you are at risk for osteoporosis and fractures, ask your health care provider if you should:  Be screened for bone loss.  Take a calcium or vitamin D supplement to lower your risk of fractures.  Be given hormone replacement therapy (HRT) to treat symptoms of menopause. Follow these instructions at home: Lifestyle  Do not use any products that contain nicotine or tobacco, such as cigarettes, e-cigarettes, and chewing tobacco. If you need help quitting, ask your health care provider.  Do not use street drugs.  Do not share needles.  Ask your  health care provider for help if you need support or information about quitting drugs. Alcohol use  Do not drink alcohol if: ? Your health care provider tells you not to drink. ? You are pregnant, may be pregnant, or are planning to become pregnant.  If you drink alcohol: ? Limit how much you use to 0-1 drink a day. ? Limit intake if you are breastfeeding.  Be aware of how much alcohol is in your drink. In the [place] one drink equals one 12 oz bottle of beer (355 mL), one 5 oz glass of wine (148 mL), or one 1 oz glass of hard liquor (44 mL). General instructions  Schedule regular health, dental, and eye exams.  Stay current with your vaccines.  Tell your health care provider if: ? You often feel depressed. ? You have ever been abused or do not feel safe at home. Summary  Adopting a healthy lifestyle and getting preventive care are important in promoting health and wellness.  Follow your health care provider's instructions about healthy diet, exercising, and getting tested or screened for diseases.  Follow your health care provider's instructions on monitoring your cholesterol and blood pressure. This information is not intended to replace advice given to you by your health care provider. Make sure you discuss any questions you have with your health care provider. Document Revised: 01/02/2018 Document Reviewed: 01/02/2018 Elsevier Patient Education  2020 [employer].    [place] K. Hayly Litsey M.D.

## 2019-07-25 ENCOUNTER — Ambulatory Visit (INDEPENDENT_AMBULATORY_CARE_PROVIDER_SITE_OTHER): Payer: Medicare Other | Admitting: Internal Medicine

## 2019-07-25 ENCOUNTER — Encounter: Payer: Self-pay | Admitting: Internal Medicine

## 2019-07-25 ENCOUNTER — Other Ambulatory Visit: Payer: Self-pay

## 2019-07-25 VITALS — BP 112/72 | HR 71 | Temp 97.7°F | Ht 62.75 in | Wt 178.2 lb

## 2019-07-25 DIAGNOSIS — Z0000 Encounter for general adult medical examination without abnormal findings: Secondary | ICD-10-CM | POA: Diagnosis not present

## 2019-07-25 DIAGNOSIS — Z79899 Other long term (current) drug therapy: Secondary | ICD-10-CM

## 2019-07-25 DIAGNOSIS — E7800 Pure hypercholesterolemia, unspecified: Secondary | ICD-10-CM | POA: Diagnosis not present

## 2019-07-25 DIAGNOSIS — K5900 Constipation, unspecified: Secondary | ICD-10-CM

## 2019-07-25 LAB — LIPID PANEL
Cholesterol: 204 mg/dL — ABNORMAL HIGH (ref 0–200)
HDL: 73.1 mg/dL (ref >39.00)
LDL Cholesterol: 116 mg/dL — ABNORMAL HIGH (ref 0–99)
NonHDL: 131
Total CHOL/HDL Ratio: 3
Triglycerides: 77 mg/dL (ref 0.0–149.0)
VLDL: 15.4 mg/dL (ref 0.0–40.0)

## 2019-07-25 LAB — BASIC METABOLIC PANEL
BUN: 10 mg/dL (ref 6–23)
CO2: 29 mEq/L (ref 19–32)
Calcium: 9.5 mg/dL (ref 8.4–10.5)
Chloride: 107 mEq/L (ref 96–112)
Creatinine, Ser: 0.78 mg/dL (ref 0.40–1.20)
GFR: 87.6 mL/min (ref >60.00)
Glucose, Bld: 82 mg/dL (ref 70–99)
Potassium: 4.3 mEq/L (ref 3.5–5.1)
Sodium: 143 mEq/L (ref 135–145)

## 2019-07-25 LAB — HEPATIC FUNCTION PANEL
ALT: 10 U/L (ref 0–35)
AST: 16 U/L (ref 0–37)
Albumin: 4.1 g/dL (ref 3.5–5.2)
Alkaline Phosphatase: 95 U/L (ref 39–117)
Bilirubin, Direct: 0.2 mg/dL (ref 0.0–0.3)
Total Bilirubin: 0.9 mg/dL (ref 0.2–1.2)
Total Protein: 6.7 g/dL (ref 6.0–8.3)

## 2019-07-25 LAB — CBC WITH DIFFERENTIAL/PLATELET
Basophils Absolute: 0 10*3/uL (ref 0.0–0.1)
Basophils Relative: 0.5 % (ref 0.0–3.0)
Eosinophils Absolute: 0.1 10*3/uL (ref 0.0–0.7)
Eosinophils Relative: 1.9 % (ref 0.0–5.0)
HCT: 35.6 % — ABNORMAL LOW (ref 36.0–46.0)
Hemoglobin: 12 g/dL (ref 12.0–15.0)
Lymphocytes Relative: 30.1 % (ref 12.0–46.0)
Lymphs Abs: 1.9 10*3/uL (ref 0.7–4.0)
MCHC: 33.8 g/dL (ref 30.0–36.0)
MCV: 91.1 fl (ref 78.0–100.0)
Monocytes Absolute: 0.6 10*3/uL (ref 0.1–1.0)
Monocytes Relative: 9.6 % (ref 3.0–12.0)
Neutro Abs: 3.6 10*3/uL (ref 1.4–7.7)
Neutrophils Relative %: 57.9 % (ref 43.0–77.0)
Platelets: 266 10*3/uL (ref 150.0–400.0)
RBC: 3.9 Mil/uL (ref 3.87–5.11)
RDW: 14.2 % (ref 11.5–15.5)
WBC: 6.3 10*3/uL (ref 4.0–10.5)

## 2019-07-25 LAB — TSH: TSH: 2.29 u[IU]/mL (ref 0.35–4.50)

## 2019-07-25 NOTE — Patient Instructions (Signed)
Continue lifestyle intervention healthy eating and exercise .   Can try miralax also  As needed for constipation.   If getting worse  Can see GI team for evaluation.    Will notify you  of labs when available.     Health Maintenance, Female Adopting a healthy lifestyle and getting preventive care are important in promoting health and wellness. Ask your health care provider about:  The right schedule for you to have regular tests and exams.  Things you can do on your own to prevent diseases and keep yourself healthy. What should I know about diet, weight, and exercise? Eat a healthy diet   Eat a diet that includes plenty of vegetables, fruits, low-fat dairy products, and lean protein.  Do not eat a lot of foods that are high in solid fats, added sugars, or sodium. Maintain a healthy weight Body mass index (BMI) is used to identify weight problems. It estimates body fat based on height and weight. Your health care provider can help determine your BMI and help you achieve or maintain a healthy weight. Get regular exercise Get regular exercise. This is one of the most important things you can do for your health. Most adults should:  Exercise for at least 150 minutes each week. The exercise should increase your heart rate and make you sweat (moderate-intensity exercise).  Do strengthening exercises at least twice a week. This is in addition to the moderate-intensity exercise.  Spend less time sitting. Even light physical activity can be beneficial. Watch cholesterol and blood lipids Have your blood tested for lipids and cholesterol at 74 years of age, then have this test every 5 years. Have your cholesterol levels checked more often if:  Your lipid or cholesterol levels are high.  You are older than 73 years of age.  You are at high risk for heart disease. What should I know about cancer screening? Depending on your health history and family history, you may need to have cancer  screening at various ages. This may include screening for:  Breast cancer.  Cervical cancer.  Colorectal cancer.  Skin cancer.  Lung cancer. What should I know about heart disease, diabetes, and high blood pressure? Blood pressure and heart disease  High blood pressure causes heart disease and increases the risk of stroke. This is more likely to develop in people who have high blood pressure readings, are of African descent, or are overweight.  Have your blood pressure checked: ? Every 3-5 years if you are 73-94 years of age. ? Every year if you are 22 years old or older. Diabetes Have regular diabetes screenings. This checks your fasting blood sugar level. Have the screening done:  Once every three years after age 73 if you are at a normal weight and have a low risk for diabetes.  More often and at a younger age if you are overweight or have a high risk for diabetes. What should I know about preventing infection? Hepatitis B If you have a higher risk for hepatitis B, you should be screened for this virus. Talk with your health care provider to find out if you are at risk for hepatitis B infection. Hepatitis C Testing is recommended for:  Everyone born from [birth year] through 1964.  Anyone with known risk factors for hepatitis C. Sexually transmitted infections (STIs)  Get screened for STIs, including gonorrhea and chlamydia, if: ? You are sexually active and are younger than 73 years of age. ? You are older than 73 years  of age and your health care provider tells you that you are at risk for this type of infection. ? Your sexual activity has changed since you were last screened, and you are at increased risk for chlamydia or gonorrhea. Ask your health care provider if you are at risk.  Ask your health care provider about whether you are at high risk for HIV. Your health care provider may recommend a prescription medicine to help prevent HIV infection. If you choose to take  medicine to prevent HIV, you should first get tested for HIV. You should then be tested every 3 months for as long as you are taking the medicine. Pregnancy  If you are about to stop having your period (premenopausal) and you may become pregnant, seek counseling before you get pregnant.  Take 400 to 800 micrograms (mcg) of folic acid every day if you become pregnant.  Ask for birth control (contraception) if you want to prevent pregnancy. Osteoporosis and menopause Osteoporosis is a disease in which the bones lose minerals and strength with aging. This can result in bone fractures. If you are 73 years old or older, or if you are at risk for osteoporosis and fractures, ask your health care provider if you should:  Be screened for bone loss.  Take a calcium or vitamin D supplement to lower your risk of fractures.  Be given hormone replacement therapy (HRT) to treat symptoms of menopause. Follow these instructions at home: Lifestyle  Do not use any products that contain nicotine or tobacco, such as cigarettes, e-cigarettes, and chewing tobacco. If you need help quitting, ask your health care provider.  Do not use street drugs.  Do not share needles.  Ask your health care provider for help if you need support or information about quitting drugs. Alcohol use  Do not drink alcohol if: ? Your health care provider tells you not to drink. ? You are pregnant, may be pregnant, or are planning to become pregnant.  If you drink alcohol: ? Limit how much you use to 0-1 drink a day. ? Limit intake if you are breastfeeding.  Be aware of how much alcohol is in your drink. In the [place] one drink equals one 12 oz bottle of beer (355 mL), one 5 oz glass of wine (148 mL), or one 1 oz glass of hard liquor (44 mL). General instructions  Schedule regular health, dental, and eye exams.  Stay current with your vaccines.  Tell your health care provider if: ? You often feel depressed. ? You have  ever been abused or do not feel safe at home. Summary  Adopting a healthy lifestyle and getting preventive care are important in promoting health and wellness.  Follow your health care provider's instructions about healthy diet, exercising, and getting tested or screened for diseases.  Follow your health care provider's instructions on monitoring your cholesterol and blood pressure. This information is not intended to replace advice given to you by your health care provider. Make sure you discuss any questions you have with your health care provider. Document Revised: 01/02/2018 Document Reviewed: 01/02/2018 Elsevier Patient Education  2020 [employer].

## 2019-07-25 NOTE — Progress Notes (Signed)
Results are normal  or  close to normal  range cholesterol better  No significant  abnormalities on  lab results

## 2019-08-18 ENCOUNTER — Other Ambulatory Visit: Payer: Self-pay | Admitting: Internal Medicine

## 2019-08-18 DIAGNOSIS — Z1231 Encounter for screening mammogram for malignant neoplasm of breast: Secondary | ICD-10-CM

## 2019-08-22 ENCOUNTER — Other Ambulatory Visit: Payer: Self-pay

## 2019-08-22 ENCOUNTER — Ambulatory Visit (INDEPENDENT_AMBULATORY_CARE_PROVIDER_SITE_OTHER): Payer: Medicare Other

## 2019-08-22 DIAGNOSIS — Z780 Asymptomatic menopausal state: Secondary | ICD-10-CM

## 2019-08-22 DIAGNOSIS — Z0000 Encounter for general adult medical examination without abnormal findings: Secondary | ICD-10-CM | POA: Diagnosis not present

## 2019-08-22 NOTE — Patient Instructions (Signed)
[name] , Thank you for taking time to come for your Medicare Wellness Visit. I appreciate your ongoing commitment to your health goals. Please review the following plan we discussed and let me know if I can assist you in the future.   Screening recommendations/referrals: Colonoscopy: Up to date, next due 09/13/2026 Mammogram: Up to date, next due 09/24/2019 Bone Density: Currently due, orders placed this visit. Recommended yearly ophthalmology/optometry visit for glaucoma screening and checkup Recommended yearly dental visit for hygiene and checkup  Vaccinations: Influenza vaccine: Up to date, next due 08/2019 Pneumococcal vaccine: completed series Tdap vaccine: Currently due, please contact your insurance to discuss cost or you may await and injury to receive  Shingles vaccine: completed series     Advanced directives: Please bring in copies to your next office visit   Conditions/risks identified: None  Next appointment: None   Preventive Care 65 Years and Older, Female Preventive care refers to lifestyle choices and visits with your health care provider that can promote health and wellness. What does preventive care include?  A yearly physical exam. This is also called an annual well check.  Dental exams once or twice a year.  Routine eye exams. Ask your health care provider how often you should have your eyes checked.  Personal lifestyle choices, including:  Daily care of your teeth and gums.  Regular physical activity.  Eating a healthy diet.  Avoiding tobacco and drug use.  Limiting alcohol use.  Practicing safe sex.  Taking low-dose aspirin every day.  Taking vitamin and mineral supplements as recommended by your health care provider. What happens during an annual well check? The services and screenings done by your health care provider during your annual well check will depend on your age, overall health, lifestyle risk factors, and family history of  disease. Counseling  Your health care provider may ask you questions about your:  Alcohol use.  Tobacco use.  Drug use.  Emotional well-being.  Home and relationship well-being.  Sexual activity.  Eating habits.  History of falls.  Memory and ability to understand (cognition).  Work and work Astronomer.  Reproductive health. Screening  You may have the following tests or measurements:  Height, weight, and BMI.  Blood pressure.  Lipid and cholesterol levels. These may be checked every 5 years, or more frequently if you are over 107 years old.  Skin check.  Lung cancer screening. You may have this screening every year starting at age 74 if you have a 30-pack-year history of smoking and currently smoke or have quit within the past 15 years.  Fecal occult blood test (FOBT) of the stool. You may have this test every year starting at age 83.  Flexible sigmoidoscopy or colonoscopy. You may have a sigmoidoscopy every 5 years or a colonoscopy every 10 years starting at age 12.  Hepatitis C blood test.  Hepatitis B blood test.  Sexually transmitted disease (STD) testing.  Diabetes screening. This is done by checking your blood sugar (glucose) after you have not eaten for a while (fasting). You may have this done every 1-3 years.  Bone density scan. This is done to screen for osteoporosis. You may have this done starting at age 36.  Mammogram. This may be done every 1-2 years. Talk to your health care provider about how often you should have regular mammograms. Talk with your health care provider about your test results, treatment options, and if necessary, the need for more tests. Vaccines  Your health care provider  may recommend certain vaccines, such as:  Influenza vaccine. This is recommended every year.  Tetanus, diphtheria, and acellular pertussis (Tdap, Td) vaccine. You may need a Td booster every 10 years.  Zoster vaccine. You may need this after age  83.  Pneumococcal 13-valent conjugate (PCV13) vaccine. One dose is recommended after age 14.  Pneumococcal polysaccharide (PPSV23) vaccine. One dose is recommended after age 78. Talk to your health care provider about which screenings and vaccines you need and how often you need them. This information is not intended to replace advice given to you by your health care provider. Make sure you discuss any questions you have with your health care provider. Document Released: 02/05/2015 Document Revised: 09/29/2015 Document Reviewed: 11/10/2014 Elsevier Interactive Patient Education  2017 [employer] Prevention in the Home Falls can cause injuries. They can happen to people of all ages. There are many things you can do to make your home safe and to help prevent falls. What can I do on the outside of my home?  Regularly fix the edges of walkways and driveways and fix any cracks.  Remove anything that might make you trip as you walk through a door, such as a raised step or threshold.  Trim any bushes or trees on the path to your home.  Use bright outdoor lighting.  Clear any walking paths of anything that might make someone trip, such as rocks or tools.  Regularly check to see if handrails are loose or broken. Make sure that both sides of any steps have handrails.  Any raised decks and porches should have guardrails on the edges.  Have any leaves, snow, or ice cleared regularly.  Use sand or salt on walking paths during winter.  Clean up any spills in your garage right away. This includes oil or grease spills. What can I do in the bathroom?  Use night lights.  Install grab bars by the toilet and in the tub and shower. Do not use towel bars as grab bars.  Use non-skid mats or decals in the tub or shower.  If you need to sit down in the shower, use a plastic, non-slip stool.  Keep the floor dry. Clean up any water that spills on the floor as soon as it happens.  Remove  soap buildup in the tub or shower regularly.  Attach bath mats securely with double-sided non-slip rug tape.  Do not have throw rugs and other things on the floor that can make you trip. What can I do in the bedroom?  Use night lights.  Make sure that you have a light by your bed that is easy to reach.  Do not use any sheets or blankets that are too big for your bed. They should not hang down onto the floor.  Have a firm chair that has side arms. You can use this for support while you get dressed.  Do not have throw rugs and other things on the floor that can make you trip. What can I do in the kitchen?  Clean up any spills right away.  Avoid walking on wet floors.  Keep items that you use a lot in easy-to-reach places.  If you need to reach something above you, use a strong step stool that has a grab bar.  Keep electrical cords out of the way.  Do not use floor polish or wax that makes floors slippery. If you must use wax, use non-skid floor wax.  Do not have throw rugs  and other things on the floor that can make you trip. What can I do with my stairs?  Do not leave any items on the stairs.  Make sure that there are handrails on both sides of the stairs and use them. Fix handrails that are broken or loose. Make sure that handrails are as long as the stairways.  Check any carpeting to make sure that it is firmly attached to the stairs. Fix any carpet that is loose or worn.  Avoid having throw rugs at the top or bottom of the stairs. If you do have throw rugs, attach them to the floor with carpet tape.  Make sure that you have a light switch at the top of the stairs and the bottom of the stairs. If you do not have them, ask someone to add them for you. What else can I do to help prevent falls?  Wear shoes that:  Do not have high heels.  Have rubber bottoms.  Are comfortable and fit you well.  Are closed at the toe. Do not wear sandals.  If you use a  stepladder:  Make sure that it is fully opened. Do not climb a closed stepladder.  Make sure that both sides of the stepladder are locked into place.  Ask someone to hold it for you, if possible.  Clearly mark and make sure that you can see:  Any grab bars or handrails.  First and last steps.  Where the edge of each step is.  Use tools that help you move around (mobility aids) if they are needed. These include:  Canes.  Walkers.  Scooters.  Crutches.  Turn on the lights when you go into a dark area. Replace any light bulbs as soon as they burn out.  Set up your furniture so you have a clear path. Avoid moving your furniture around.  If any of your floors are uneven, fix them.  If there are any pets around you, be aware of where they are.  Review your medicines with your doctor. Some medicines can make you feel dizzy. This can increase your chance of falling. Ask your doctor what other things that you can do to help prevent falls. This information is not intended to replace advice given to you by your health care provider. Make sure you discuss any questions you have with your health care provider. Document Released: 11/05/2008 Document Revised: 06/17/2015 Document Reviewed: 02/13/2014 Elsevier Interactive Patient Education  2017 [employer].

## 2019-08-22 NOTE — Progress Notes (Signed)
Subjective:   Cathy Meyers is a 74 y.o. female who presents for Medicare Annual (Subsequent) preventive examination.  I connected with Amylynn Fano  today by telephone and verified that I am speaking with the correct person using two identifiers. Location patient: home Location provider: work Persons participating in the virtual visit: patient, provider.   I discussed the limitations, risks, security and privacy concerns of performing an evaluation and management service by telephone and the availability of in person appointments. I also discussed with the patient that there may be a patient responsible charge related to this service. The patient expressed understanding and verbally consented to this telephonic visit.    Interactive audio and video telecommunications were attempted between this provider and patient, however failed, due to patient having technical difficulties OR patient did not have access to video capability.  We continued and completed visit with audio only.     Review of Systems    N/A Cardiac Risk Factors include: advanced age (>30men, >16 women)     Objective:    Today's Vitals   There is no height or weight on file to calculate BMI.  Advanced Directives 08/22/2019 08/08/2017 02/28/2017 02/25/2016  Does Patient Have a Medical Advance Directive? Yes Yes No (No Data)  Type of Estate agent of [place];Living will - - -  Does patient want to make changes to medical advance directive? No - Patient declined - - -  Copy of Healthcare Power of Attorney in Chart? No - copy requested - - -  Would patient like information on creating a medical advance directive? - - Yes (MAU/Ambulatory/Procedural Areas - Information given) -    Current Medications (verified) Outpatient Encounter Medications as of 08/22/2019  Medication Sig  . Ascorbic Acid (VITAMIN C) 1000 MG tablet Vitamin C  . Cholecalciferol (VITAMIN D) 50 MCG (2000 UT) CAPS Vitamin D    . fluticasone (FLONASE) 50 MCG/ACT nasal spray SPRAY 2 SPRAYS INTO EACH NOSTRIL EVERY DAY  . Multiple Vitamin (MULTIVITAMIN) tablet Take 1 tablet by mouth daily.    . naproxen sodium (ALEVE) 220 MG tablet Take 440 mg by mouth daily as needed (for headache).  Marland Kitchen ibuprofen (ADVIL,MOTRIN) 600 MG tablet Take 1 tablet (600 mg total) by mouth every 6 (six) hours as needed. (Patient not taking: Reported on 08/22/2019)   No facility-administered encounter medications on file as of 08/22/2019.    Allergies (verified) Penicillins   History: Past Medical History:  Diagnosis Date  . Allergy   . Anemia   . Chronic kidney disease   . Gastritis    EGD neg bx 2011   . Headache    otc med prn  . Hiatal hernia    small on egd  . History of varicella   . Hx: UTI (urinary tract infection)    pyelo  . Hyperlipidemia    diet controlled   . Pyelonephritis 06/04/2010  . Shingles 11/24/2011   Classic typical symptoms expectant management and treatment begin antiviral. Discussed pain control options   . Sickle cell trait (HCC)   . SVD (spontaneous vaginal delivery)    x 3   Past Surgical History:  Procedure Laterality Date  . COLONOSCOPY    . DILATION AND CURETTAGE OF UTERUS  06/2016  . HYSTEROSCOPY WITH D & C N/A 03/02/2017   Procedure: DILATATION AND CURETTAGE /HYSTEROSCOPY;  Surgeon: Mitchel Honour, DO;  Location: [facility];  Service: Gynecology;  Laterality: N/A;  . UPPER GASTROINTESTINAL ENDOSCOPY    .  WISDOM TOOTH EXTRACTION     Family History  Adopted: Yes  Problem Relation Age of Onset  . Hypertension Mother        family hx  . Heart failure Mother   . Colon cancer Neg Hx   . Colon polyps Neg Hx   . Esophageal cancer Neg Hx   . Rectal cancer Neg Hx   . Stomach cancer Neg Hx    Social History   Socioeconomic History  . Marital status: Married    Spouse name: Not on file  . Number of children: Not on file  . Years of education: Not on file  . Highest education level: Not on file   Occupational History  . Not on file  Tobacco Use  . Smoking status: Former Smoker    Packs/day: 0.10    Types: Cigarettes    Quit date: 1998    Years since quitting: 23.5  . Smokeless tobacco: Never Used  . Tobacco comment: smoked in school periodically   Vaping Use  . Vaping Use: Never used  Substance and Sexual Activity  . Alcohol use: Yes    Comment: socially  . Drug use: No  . Sexual activity: Not Currently    Birth control/protection: Post-menopausal  Other Topics Concern  . 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 Difficulty of Paying Living Expenses: Not hard at all  Food Insecurity: No Food Insecurity  . Worried About Programme researcher, broadcasting/film/video in the Last Year: Never true  . Ran Out of Food in the Last Year: Never true  Transportation Needs: No Transportation Needs  . Lack of Transportation (Medical): No  . Lack of Transportation (Non-Medical): No  Physical Activity: Inactive  . Days of Exercise per Week: 0 days  . Minutes of Exercise per Session: 0 min  Stress: No Stress Concern Present  . Feeling of Stress : Not at all  Social Connections: Socially Integrated  . Frequency of Communication with Friends and Family: More than three times a week  . Frequency of Social Gatherings with Friends and Family: More than three times a week  . Attends Religious Services: More than 4 times per year  . Active Member of Clubs or Organizations: Yes  . Attends Banker Meetings: More than 4 times per year  . Marital Status: Married    Tobacco Counseling Counseling given: Not Answered Comment: smoked in school periodically    Clinical Intake:  Pre-visit preparation completed: Yes  Pain :  No/denies pain     Nutritional Risks: None Diabetes: No  How often do you need to have someone help you when you read instructions, pamphlets, or other written materials from your doctor or pharmacy?: 1 - Never What is the last grade level you completed in school?: High School Graduate  Diabetic?No  Interpreter Needed?: No  Information entered by :: SCrews,LPN   Activities of Daily Living In your present state of health, do you have any difficulty performing the following activities: 08/22/2019  Hearing? N  Vision? N  Difficulty concentrating or making decisions? N  Walking  or climbing stairs? Y  Comment Gets an occasional ache in her knees  Dressing or bathing? N  Doing errands, shopping? N  Preparing Food and eating ? N  Using the Toilet? N  In the past six months, have you accidently leaked urine? N  Do you have problems with loss of bowel control? N  Managing your Medications? N  Managing your Finances? N  Housekeeping or managing your Housekeeping? N  Some recent data might be hidden    Patient Care Team: Panosh, Neta MendsWanda K, MD as PCP - General Meryl DareStark, Malcolm T, MD as Consulting Physician (Gastroenterology) Mitchel HonourMorris, Megan, DO as Consulting Physician (Obstetrics and Gynecology)  Indicate any recent Medical Services you may have received from other than [facility] providers in the past year (date may be approximate).     Assessment:   This is a routine wellness examination for [place].  Hearing/Vision screen  Hearing Screening   125Hz  250Hz  500Hz  1000Hz  2000Hz  3000Hz  4000Hz  6000Hz  8000Hz   Right ear:           Left ear:           Vision Screening Comments: Gets eye exams annually   Dietary issues and exercise activities discussed: Current Exercise Habits: The patient has a physically strenuous job, but has no regular exercise apart from work.;The patient does not participate in regular exercise at present  Goals    .  Patient Stated      Keep monitoring her diet and  eating well     .  Weight (lb) < 150 lb (68 kg) (pt-stated)      I would like to lose a 25 lbs     .  Weight (lb) < 165 lb (74.8 kg)      Portion control;  Try to keep sodium around 2500mg  per day   Check out  online nutrition programs as WikiBlast.com.cychosemyplate.gov and LimitLaws.com.cymyfitnesspal.com; fit702me; Look for foods with "whole" wheat; bran; oatmeal etc Shot at the farmer's markets in season for fresher choices  Watch for "hydrogenated" on the label of oils which are trans-fats.  Watch for "high fructose corn syrup" in snacks, yogurt or ketchup  Meats have less marbling; bright colored fruits and vegetables;  Canned; dump out liquid and wash vegetables. Be mindful of what we are eating  Portion control is essential to a health weight! Sit down; take a break and enjoy your meal; take smaller bites; put the fork down between bites;  It takes 20 minutes to get full; so check in with your fullness cues and stop eating when you start to fill full             Depression Screen PHQ 2/9 Scores 08/22/2019 07/25/2019 08/08/2017 04/18/2017 03/01/2016 02/25/2016 03/02/2015  PHQ - 2 Score 0 0 0 0 0 0 0  PHQ- 9 Score 0 0 - - - - -    Fall Risk Fall Risk  08/22/2019 07/25/2019 08/07/2018 08/08/2017 04/18/2017  Falls in the past year? 0 0 0 No No  Number falls in past yr: 0 - - - -  Injury with Fall? 0 - - - -  Risk for fall due to : (No Data) - - - -  Risk for fall due to: Comment occassional vertigo - - - -  Follow up Falls evaluation completed;Falls prevention discussed - - - -    Any stairs in or around the home? No  If so, are there any without handrails? No  Home free of loose throw  rugs in walkways, pet beds, electrical cords, etc? Yes  Adequate lighting in your home to reduce risk of falls? Yes   ASSISTIVE DEVICES UTILIZED TO PREVENT FALLS:  Life alert? No  Use of a cane, walker or w/c? No  Grab bars in the bathroom? Yes  Shower chair or bench in shower? No  Elevated toilet seat or a handicapped toilet?  No   Cognitive Function: Cognition within normal limits through direct observation MMSE - Mini Mental State Exam 08/08/2017  Not completed: (No Data)        Immunizations Immunization History  Administered Date(s) Administered  . Influenza Split 11/21/2011, 10/24/2013  . Influenza, High Dose Seasonal PF 09/24/2015, 10/05/2016  . Influenza,inj,Quad PF,6+ Mos 10/25/2012, 10/09/2014  . PFIZER SARS-COV-2 Vaccination 02/10/2019, 02/28/2019  . Pneumococcal Conjugate-13 10/08/2013  . Pneumococcal Polysaccharide-23 08/14/2012  . Td 01/24/2004  . Zoster 05/02/2011  . Zoster Recombinat (Shingrix) 05/16/2017, 08/07/2018, 12/12/2018    TDAP status: Due, Education has been provided regarding the importance of this vaccine. Advised may receive this vaccine at local pharmacy or Health Dept. Aware to provide a copy of the vaccination record if obtained from local pharmacy or Health Dept. Verbalized acceptance and understanding. Flu Vaccine status: Up to date Pneumococcal vaccine status: Up to date Covid-19 vaccine status: Completed vaccines  Qualifies for Shingles Vaccine? Yes   Zostavax completed Yes   Shingrix Completed?: Yes  Screening Tests Health Maintenance  Topic Date Due  . TETANUS/TDAP  01/23/2014  . INFLUENZA VACCINE  08/24/2019  . MAMMOGRAM  09/23/2020  . COLONOSCOPY  09/15/2026  . DEXA SCAN  Completed  . COVID-19 Vaccine  Completed  . Hepatitis C Screening  Completed  . PNA vac Low Risk Adult  Completed    Health Maintenance  Health Maintenance Due  Topic Date Due  . TETANUS/TDAP  01/23/2014    Colorectal cancer screening: Completed 09/14/2016. Repeat every 10 years Mammogram status: Completed 09/24/2018. Repeat every year Bone Density status: Completed 08/21/2012. Results reflect: Bone density results: NORMAL. Repeat every 0 years.  Lung Cancer Screening: (Low Dose CT Chest recommended if Age 82-80 years, 30 pack-year currently smoking OR have quit w/in 15years.)  does not qualify.   Lung Cancer Screening Referral: N/A  Additional Screening:  Hepatitis C Screening: does qualify; Completed 02/25/2016  Vision Screening: Recommended annual ophthalmology exams for early detection of glaucoma and other disorders of the eye. Is the patient up to date with their annual eye exam?  Yes  Who is the provider or what is the name of the office in which the patient attends annual eye exams? [facility]  If pt is not established with a provider, would they like to be referred to a provider to establish care? No .   Dental Screening: Recommended annual dental exams for proper oral hygiene  Community Resource Referral / Chronic Care Management: CRR required this visit?  No   CCM required this visit?  No      Plan:     I have personally reviewed and noted the following in the patient's chart:   . Medical and social history . Use of alcohol, tobacco or illicit drugs  . Current medications and supplements . Functional ability and status . Nutritional status . Physical activity . Advanced directives . List of other physicians . Hospitalizations, surgeries, and ER visits in previous 12 months . Vitals . Screenings to include cognitive, depression, and falls . Referrals and appointments  In addition, I have reviewed and discussed with  patient certain preventive protocols, quality metrics, and best practice recommendations. A written personalized care plan for preventive services as well as general preventive health recommendations were provided to patient.     Theodora Blow, LPN   [id number]/60/[id number]   Nurse Notes: None

## 2019-09-01 ENCOUNTER — Other Ambulatory Visit: Payer: Self-pay | Admitting: Internal Medicine

## 2019-09-01 DIAGNOSIS — E2839 Other primary ovarian failure: Secondary | ICD-10-CM

## 2019-09-02 ENCOUNTER — Telehealth: Payer: Self-pay | Admitting: Internal Medicine

## 2019-09-02 ENCOUNTER — Telehealth: Payer: Self-pay | Admitting: *Deleted

## 2019-09-02 NOTE — Telephone Encounter (Signed)
Pt is calling to get a order for a tetnus shot at the pharmacy. She was scheduled here but her insurance will not cover it.   She said she can pick it up tomorrow around 10 or 11.  Please call pt at [phone]

## 2019-09-02 NOTE — Telephone Encounter (Signed)
Clinic RN called patient to inform her that if she receives tetanus shot in office insurance will not cover since she has Medicare. Informed patient in order for insurance to cover she will need a Rx and take to pharmacy.  Patient verbalized she would like this.  Patient would like a call when this is ready for pick up.

## 2019-09-02 NOTE — Telephone Encounter (Signed)
Iv never  Had to write a rx for this but if so you can send   Td rx to her pharmacy

## 2019-09-02 NOTE — Telephone Encounter (Signed)
Last Td was given on 01/24/2004  Please advise. Can you hand write an Rx for this?

## 2019-09-03 ENCOUNTER — Ambulatory Visit: Payer: Medicare Other

## 2019-09-03 NOTE — Telephone Encounter (Signed)
Called patient and let her know that she should not need a prescription for a tetanus vaccine from the pharmacy and I printed out her vaccine record that we have and left up front for patient to pick up at her convenience. Patient verbalized an understanding.

## 2019-09-03 NOTE — Telephone Encounter (Signed)
Called patient and let her know that she should not need a prescription for a tetanus vaccine from the pharmacy and I printed out her vaccine record that we have and left up front for patient to pick up at her convenience. Patient verbalized an understanding. 

## 2019-09-25 ENCOUNTER — Ambulatory Visit: Payer: Medicare Other

## 2019-10-01 ENCOUNTER — Other Ambulatory Visit: Payer: Self-pay

## 2019-10-01 ENCOUNTER — Ambulatory Visit
Admission: RE | Admit: 2019-10-01 | Discharge: 2019-10-01 | Disposition: A | Discharge status: Home / Self Care | Payer: Medicare Other | Source: Ambulatory Visit | Attending: Internal Medicine | Admitting: Internal Medicine

## 2019-10-01 DIAGNOSIS — Z1231 Encounter for screening mammogram for malignant neoplasm of breast: Secondary | ICD-10-CM | POA: Diagnosis not present

## 2019-11-13 DIAGNOSIS — H5203 Hypermetropia, bilateral: Secondary | ICD-10-CM | POA: Diagnosis not present

## 2019-11-13 DIAGNOSIS — H25013 Cortical age-related cataract, bilateral: Secondary | ICD-10-CM | POA: Diagnosis not present

## 2019-11-13 DIAGNOSIS — H10413 Chronic giant papillary conjunctivitis, bilateral: Secondary | ICD-10-CM | POA: Diagnosis not present

## 2019-12-05 ENCOUNTER — Ambulatory Visit
Admission: RE | Admit: 2019-12-05 | Discharge: 2019-12-05 | Disposition: A | Discharge status: Home / Self Care | Payer: Medicare Other | Source: Ambulatory Visit | Attending: Internal Medicine | Admitting: Internal Medicine

## 2019-12-05 ENCOUNTER — Other Ambulatory Visit: Payer: Self-pay

## 2019-12-05 DIAGNOSIS — M8588 Other specified disorders of bone density and structure, other site: Secondary | ICD-10-CM | POA: Diagnosis not present

## 2019-12-05 DIAGNOSIS — E2839 Other primary ovarian failure: Secondary | ICD-10-CM

## 2019-12-05 DIAGNOSIS — Z780 Asymptomatic menopausal state: Secondary | ICD-10-CM | POA: Diagnosis not present

## 2020-03-14 NOTE — Progress Notes (Signed)
Please make sure  patient has copy of the bone denisty  ( no osteoporosis)   not sure she has seen this.  Weigh bearing exercise and adequate  vit d   intake 1000 iu per day

## 2020-03-30 ENCOUNTER — Telehealth: Payer: Self-pay

## 2020-03-30 NOTE — Telephone Encounter (Signed)
-----   Message from Madelin Headings, MD sent at 03/14/2020  6:39 PM EST ----- Please make sure  patient has copy of the bone denisty  ( no osteoporosis)   not sure she has seen this.  Weigh bearing exercise and adequate  vit d   intake 1000 iu per day

## 2020-03-30 NOTE — Telephone Encounter (Signed)
Spoke with patient about bone scan.  Has requested copy to be mail.  Patient said she will call the office to get copy mailed

## 2020-04-29 DIAGNOSIS — M170 Bilateral primary osteoarthritis of knee: Secondary | ICD-10-CM | POA: Diagnosis not present

## 2020-08-02 ENCOUNTER — Other Ambulatory Visit: Payer: Self-pay

## 2020-08-02 NOTE — Progress Notes (Signed)
Chief Complaint  Patient presents with   Annual Exam     HPI: Cathy Meyers 75 y.o. comes in today for Preventive Medicare exam/ wellness visit .Since last visit.  Fighting weight having a hard time getting weight back down but is paying attention.  Would like to get down into the 170s 160 would be optimal. No major changes in health otherwise husband is diabetic so his attention to sugar.  Health Maintenance  Topic Date Due   TETANUS/TDAP  01/23/2014   COVID-19 Vaccine (3 - Pfizer risk series) 03/28/2019   INFLUENZA VACCINE  08/23/2020   MAMMOGRAM  09/30/2021   COLONOSCOPY (Pts 45-55yrs Insurance coverage will need to be confirmed)  09/15/2026   DEXA SCAN  Completed   Hepatitis C Screening  Completed   PNA vac Low Risk Adult  Completed   Zoster Vaccines- Shingrix  Completed   HPV VACCINES  Aged Out   Health Maintenance Review LIFESTYLE:  Exercise:    sen[facility] centers   3 x[per week.  Tobacco/ETS: no Alcohol:  no Sugar beverages:no Sleep: about 6-7  sometimes  later has been falls asleep with the TV on Drug use: no HH:  2   no pets .  [facility]tc   [facility]   early arthritis .    Husband with diabetes.  Hearing:  ok  Vision:  No limitations at present . Last eye check UTD Safety:  Has smoke detector and wears seat belts.   No excess sun exposure. Sees dentist regularly. Falls: n Memory: Felt to be good  , ocass forgetting  no concern from her or her family. Depression: No anhedonia unusual crying or depressive symptoms Nutrition: Eats well balanced diet; adequate calcium and vitamin D. No swallowing chewing problems. Injury: no major injuries in the last six months. Other healthcare providers:  Reviewed today . Preventive parameters: up-to-date  Reviewed  ADLS:   There are no problems or need for assistance  driving, feeding, obtaining food, dressing, toileting and bathing, managing money using phone. She is independent.    ROS:  GEN/ HEENT: No fever,  significant weight changes sweats headaches vision problems hearing changes, CV/ PULM; No chest pain shortness of breath cough, syncope,edema  change in exercise tolerance. GI /GU: No adominal pain, vomiting, change in bowel habits. No blood in the stool. No significant GU symptoms. SKIN/HEME: ,no acute skin rashes suspicious lesions or bleeding. No lymphadenopathy, nodules, masses.  NEURO/ PSYCH:  No neurologic signs such as weakness numbness. No depression anxiety. IMM/ Allergy: No unusual infections.  Allergy .   REST of 12 system review negative except as per HPI   Past Medical History:  Diagnosis Date   Allergy    Anemia    Chronic kidney disease    Gastritis    EGD neg bx 2011    Headache    otc med prn   Hiatal hernia    small on egd   History of varicella    Hx: UTI (urinary tract infection)    pyelo   Hyperlipidemia    diet controlled    Pyelonephritis 06/04/2010   Shingles 11/24/2011   Classic typical symptoms expectant management and treatment begin antiviral. Discussed pain control options    Sickle cell trait (HCC)    SVD (spontaneous vaginal delivery)    x 3    Family History  Adopted: Yes  Problem Relation Age of Onset   Hypertension Mother        family hx  Heart failure Mother    Colon cancer Neg Hx    Colon polyps Neg Hx    Esophageal cancer Neg Hx    Rectal cancer Neg Hx    Stomach cancer Neg Hx     Social History   Socioeconomic History   Marital status: Married    Spouse name: Not on file   Number of children: Not on file   Years of education: Not on file   Highest education level: Not on file  Occupational History   Not on file  Tobacco Use   Smoking status: Former    Packs/day: 0.10    Pack years: 0.00    Types: Cigarettes    Quit date: 25    Years since quitting: 24.5   Smokeless tobacco: Never   Tobacco comments:    smoked in school periodically   Vaping Use   Vaping Use: Never used  Substance and Sexual Activity   Alcohol  use: Yes    Comment: socially   Drug use: No   Sexual activity: Not Currently    Birth control/protection: Post-menopausal  Other Topics Concern   Not on file  Social History Narrative   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   Hx of divorce    No pets    Raised by family member not mom   [place]   G3P3   HHof 2  Husband has DM   - 7 hours of sleep    Social Determinants of Health   Financial Resource Strain: Low Risk    Difficulty of Paying Living Expenses: Not hard at all  Food Insecurity: No Food Insecurity   Worried About Programme researcher, broadcasting/film/video in the Last Year: Never true   Ran Out of Food in the Last Year: Never true  Transportation Needs: No Transportation Needs   Lack of Transportation (Medical): No   Lack of Transportation (Non-Medical): No  Physical Activity: Inactive   Days of Exercise per Week: 0 days   Minutes of Exercise per Session: 0 min  Stress: No Stress Concern Present   Feeling of Stress : Not at all  Social Connections: Socially Integrated   Frequency of Communication with Friends and Family: More than three times a week   Frequency of Social Gatherings with Friends and Family: More than three times a week   Attends Religious Services: More than 4 times per year   Active Member of [employer] or Organizations: Yes   Attends Banker Meetings: More than 4 times per year   Marital Status: Married    Outpatient Encounter Medications as of 08/03/2020  Medication Sig   Ascorbic Acid (VITAMIN C) 1000 MG tablet Vitamin C   Cholecalciferol (VITAMIN D) 50 MCG (2000 UT) CAPS Vitamin D   fluticasone (FLONASE) 50 MCG/ACT nasal spray SPRAY 2 SPRAYS INTO EACH NOSTRIL EVERY DAY   ibuprofen (ADVIL,MOTRIN) 600 MG tablet Take 1 tablet (600 mg total) by mouth every 6 (six) hours as needed.   Multiple Vitamin (MULTIVITAMIN) tablet Take 1 tablet by mouth daily.     naproxen sodium (ALEVE) 220 MG tablet Take 440 mg by mouth daily as needed  (for headache).   No facility-administered encounter medications on file as of 08/03/2020.    EXAM:  BP 118/80 (BP Location: Left Arm, Patient Position: Sitting, Cuff Size: Normal)   Pulse 67   Temp 98.6 F (37 C) (Oral)   Ht 5\' 3"  (1.6 m)  Wt 182 lb 3.2 oz (82.6 kg)   SpO2 95%   BMI 32.28 kg/m   Body mass index is 32.28 kg/m.  Physical Exam: Vital signs reviewed NUU:VOZD is a well-developed well-nourished alert cooperative   who appears stated age in no acute distress.  HEENT: normocephalic atraumatic , Eyes: PERRL EOM's full, conjunctiva clear, Nares: paten,t no deformity discharge or tenderness., Ears: no deformity EAC's clear TMs with normal landmarks. Mouth: masked  NECK: supple without masses, thyromegaly or bruits. CHEST/PULM:  Clear to auscultation and percussion breath sounds equal no wheeze , rales or rhonchi.  CV: PMI is nondisplaced, S1 S2 no gallops, murmurs, rubs. Peripheral pulses are full without delay.No JVD .  ABDOMEN: Bowel sounds normal nontender  No guard or rebound, no hepato splenomegal no CVA tenderness.   Extremtities:  No clubbing cyanosis or edema, no acute joint swelling or redness no focal atrophy prominent mtp old surgical scar toes  NEURO:  Oriented x3, cranial nerves 3-12 appear to be intact, no obvious focal weakness,gait within normal limits no abnormal reflexes or asymmetrical SKIN: No acute rashes normal turgor, color, no bruising or petechiae. PSYCH: Oriented, good eye contact, no obvious depression anxiety, cognition and judgment appear normal. LN: no cervical axillary inguinal adenopathy No noted deficits in memory, attention, and speech.   Lab Results  Component Value Date   WBC 6.3 07/25/2019   HGB 12.0 07/25/2019   HCT 35.6 (L) 07/25/2019   PLT 266.0 07/25/2019   GLUCOSE 82 07/25/2019   CHOL 204 (H) 07/25/2019   TRIG 77.0 07/25/2019   HDL 73.10 07/25/2019   LDLDIRECT 122.4 04/25/2011   LDLCALC 116 (H) 07/25/2019   ALT 10  07/25/2019   AST 16 07/25/2019   NA 143 07/25/2019   K 4.3 07/25/2019   CL 107 07/25/2019   CREATININE 0.78 07/25/2019   BUN 10 07/25/2019   CO2 29 07/25/2019   TSH 2.29 07/25/2019    ASSESSMENT AND PLAN:  Discussed the following assessment and plan:  Visit for preventive health examination - Plan: IBC panel, IBC panel  Medication management - Plan: Basic metabolic panel, CBC with Differential/Platelet, Hepatic function panel, Lipid panel, TSH, Hemoglobin A1c, Vitamin B12, Vitamin B12, Hemoglobin A1c, TSH, Lipid panel, Hepatic function panel, CBC with Differential/Platelet, Basic metabolic panel, CANCELED: Iron, TIBC and Ferritin Panel, CANCELED: Iron, TIBC and Ferritin Panel  Sickle cell trait (HCC) - Plan: Basic metabolic panel, CBC with Differential/Platelet, Hepatic function panel, Lipid panel, TSH, Hemoglobin A1c, Vitamin B12, Vitamin B12, Hemoglobin A1c, TSH, Lipid panel, Hepatic function panel, CBC with Differential/Platelet, Basic metabolic panel, CANCELED: Iron, TIBC and Ferritin Panel, CANCELED: Iron, TIBC and Ferritin Panel  Elevated LDL cholesterol level - Plan: Basic metabolic panel, CBC with Differential/Platelet, Hepatic function panel, Lipid panel, TSH, Hemoglobin A1c, Vitamin B12, Vitamin B12, Hemoglobin A1c, TSH, Lipid panel, Hepatic function panel, CBC with Differential/Platelet, Basic metabolic panel, CANCELED: Iron, TIBC and Ferritin Panel, CANCELED: Iron, TIBC and Ferritin Panel  Obesity (BMI 30-39.9) - Plan: Basic metabolic panel, CBC with Differential/Platelet, Hepatic function panel, Lipid panel, TSH, Hemoglobin A1c, Vitamin B12, Vitamin B12, Hemoglobin A1c, TSH, Lipid panel, Hepatic function panel, CBC with Differential/Platelet, Basic metabolic panel, CANCELED: Iron, TIBC and Ferritin Panel, CANCELED: Iron, TIBC and Ferritin Panel  Mild anemia - Plan: Basic metabolic panel, CBC with Differential/Platelet, Hepatic function panel, Lipid panel, TSH, Hemoglobin A1c,  Vitamin B12, Vitamin B12, Hemoglobin A1c, TSH, Lipid panel, Hepatic function panel, CBC with Differential/Platelet, Basic metabolic panel, CANCELED: Iron, TIBC and Ferritin Panel,  CANCELED: Iron, TIBC and Ferritin Panel Discussion options about healthy weight control eating consider weight watchers other interventions Check B12 and iron levels because of the mild anemia No alarm findings on exam. Follow-up depending and yearly. Patient Care Team: Reef Achterberg, Neta MendsWanda K, MD as PCP - General Meryl DareStark, Malcolm T, MD as Consulting Physician (Gastroenterology) Mitchel HonourMorris, Megan, DO as Consulting Physician (Obstetrics and Gynecology)  Patient Instructions  Checking anemia blood sugar  and cholesterol.   B12 and iron levels .   Wt Readings from Last 3 Encounters:  08/03/20 182 lb 3.2 oz (82.6 kg)  07/25/19 178 lb 3.2 oz (80.8 kg)  08/07/18 176 lb 9.6 oz (80.1 kg)    Consider tracking programs like weight watchers . As we discussed .    Health Maintenance, Female Adopting a healthy lifestyle and getting preventive care are important in promoting health and wellness. Ask your health care provider about: The right schedule for you to have regular tests and exams. Things you can do on your own to prevent diseases and keep yourself healthy. What should I know about diet, weight, and exercise? Eat a healthy diet  Eat a diet that includes plenty of vegetables, fruits, low-fat dairy products, and lean protein. Do not eat a lot of foods that are high in solid fats, added sugars, or sodium.  Maintain a healthy weight Body mass index (BMI) is used to identify weight problems. It estimates body fat based on height and weight. Your health care provider can help determineyour BMI and help you achieve or maintain a healthy weight. Get regular exercise Get regular exercise. This is one of the most important things you can do for your health. Most adults should: Exercise for at least 150 minutes each week. The  exercise should increase your heart rate and make you sweat (moderate-intensity exercise). Do strengthening exercises at least twice a week. This is in addition to the moderate-intensity exercise. Spend less time sitting. Even light physical activity can be beneficial. Watch cholesterol and blood lipids Have your blood tested for lipids and cholesterol at 74 years of age, then havethis test every 5 years. Have your cholesterol levels checked more often if: Your lipid or cholesterol levels are high. You are older than 74 years of age. You are at high risk for heart disease. What should I know about cancer screening? Depending on your health history and family history, you may need to have cancer screening at various ages. This may include screening for: Breast cancer. Cervical cancer. Colorectal cancer. Skin cancer. Lung cancer. What should I know about heart disease, diabetes, and high blood pressure? Blood pressure and heart disease High blood pressure causes heart disease and increases the risk of stroke. This is more likely to develop in people who have high blood pressure readings, are of African descent, or are overweight. Have your blood pressure checked: Every 3-5 years if you are 8118-74 years of age. Every year if you are 74 years old or older. Diabetes Have regular diabetes screenings. This checks your fasting blood sugar level. Have the screening done: Once every three years after age 74 if you are at a normal weight and have a low risk for diabetes. More often and at a younger age if you are overweight or have a high risk for diabetes. What should I know about preventing infection? Hepatitis B If you have a higher risk for hepatitis B, you should be screened for this virus. Talk with your health care provider to find out if  you are at risk forhepatitis B infection. Hepatitis C Testing is recommended for: Everyone born from [birth year] through 1964. Anyone with known risk factors  for hepatitis C. Sexually transmitted infections (STIs) Get screened for STIs, including gonorrhea and chlamydia, if: You are sexually active and are younger than 74 years of age. You are older than 74 years of age and your health care provider tells you that you are at risk for this type of infection. Your sexual activity has changed since you were last screened, and you are at increased risk for chlamydia or gonorrhea. Ask your health care provider if you are at risk. Ask your health care provider about whether you are at high risk for HIV. Your health care provider may recommend a prescription medicine to help prevent HIV infection. If you choose to take medicine to prevent HIV, you should first get tested for HIV. You should then be tested every 3 months for as long as you are taking the medicine. Pregnancy If you are about to stop having your period (premenopausal) and you may become pregnant, seek counseling before you get pregnant. Take 400 to 800 micrograms (mcg) of folic acid every day if you become pregnant. Ask for birth control (contraception) if you want to prevent pregnancy. Osteoporosis and menopause Osteoporosis is a disease in which the bones lose minerals and strength with aging. This can result in bone fractures. If you are 54 years old or older, or if you are at risk for osteoporosis and fractures, ask your health care provider if you should: Be screened for bone loss. Take a calcium or vitamin D supplement to lower your risk of fractures. Be given hormone replacement therapy (HRT) to treat symptoms of menopause. Follow these instructions at home: Lifestyle Do not use any products that contain nicotine or tobacco, such as cigarettes, e-cigarettes, and chewing tobacco. If you need help quitting, ask your health care provider. Do not use street drugs. Do not share needles. Ask your health care provider for help if you need support or information about quitting drugs. Alcohol  use Do not drink alcohol if: Your health care provider tells you not to drink. You are pregnant, may be pregnant, or are planning to become pregnant. If you drink alcohol: Limit how much you use to 0-1 drink a day. Limit intake if you are breastfeeding. Be aware of how much alcohol is in your drink. In the [place] one drink equals one 12 oz bottle of beer (355 mL), one 5 oz glass of wine (148 mL), or one 1 oz glass of hard liquor (44 mL). General instructions Schedule regular health, dental, and eye exams. Stay current with your vaccines. Tell your health care provider if: You often feel depressed. You have ever been abused or do not feel safe at home. Summary Adopting a healthy lifestyle and getting preventive care are important in promoting health and wellness. Follow your health care provider's instructions about healthy diet, exercising, and getting tested or screened for diseases. Follow your health care provider's instructions on monitoring your cholesterol and blood pressure. This information is not intended to replace advice given to you by your health care provider. Make sure you discuss any questions you have with your healthcare provider. Document Revised: 01/02/2018 Document Reviewed: 01/02/2018 Elsevier Patient Education  2022 [employer].     [place]. Edwinna Rochette M.D.

## 2020-08-03 ENCOUNTER — Ambulatory Visit (INDEPENDENT_AMBULATORY_CARE_PROVIDER_SITE_OTHER): Payer: Medicare Other | Admitting: Internal Medicine

## 2020-08-03 ENCOUNTER — Encounter: Payer: Self-pay | Admitting: Internal Medicine

## 2020-08-03 VITALS — BP 118/80 | HR 67 | Temp 98.6°F | Ht 63.0 in | Wt 182.2 lb

## 2020-08-03 DIAGNOSIS — E7800 Pure hypercholesterolemia, unspecified: Secondary | ICD-10-CM

## 2020-08-03 DIAGNOSIS — D573 Sickle-cell trait: Secondary | ICD-10-CM | POA: Diagnosis not present

## 2020-08-03 DIAGNOSIS — Z0000 Encounter for general adult medical examination without abnormal findings: Secondary | ICD-10-CM

## 2020-08-03 DIAGNOSIS — E669 Obesity, unspecified: Secondary | ICD-10-CM

## 2020-08-03 DIAGNOSIS — D649 Anemia, unspecified: Secondary | ICD-10-CM | POA: Diagnosis not present

## 2020-08-03 DIAGNOSIS — Z79899 Other long term (current) drug therapy: Secondary | ICD-10-CM

## 2020-08-03 LAB — BASIC METABOLIC PANEL
BUN: 9 mg/dL (ref 6–23)
CO2: 26 mEq/L (ref 19–32)
Calcium: 9.3 mg/dL (ref 8.4–10.5)
Chloride: 106 mEq/L (ref 96–112)
Creatinine, Ser: 0.87 mg/dL (ref 0.40–1.20)
GFR: 65.82 mL/min (ref >60.00)
Glucose, Bld: 78 mg/dL (ref 70–99)
Potassium: 3.9 mEq/L (ref 3.5–5.1)
Sodium: 142 mEq/L (ref 135–145)

## 2020-08-03 LAB — LIPID PANEL
Cholesterol: 214 mg/dL — ABNORMAL HIGH (ref 0–200)
HDL: 68.3 mg/dL (ref >39.00)
LDL Cholesterol: 128 mg/dL — ABNORMAL HIGH (ref 0–99)
NonHDL: 145.35
Total CHOL/HDL Ratio: 3
Triglycerides: 89 mg/dL (ref 0.0–149.0)
VLDL: 17.8 mg/dL (ref 0.0–40.0)

## 2020-08-03 LAB — HEPATIC FUNCTION PANEL
ALT: 9 U/L (ref 0–35)
AST: 17 U/L (ref 0–37)
Albumin: 4.2 g/dL (ref 3.5–5.2)
Alkaline Phosphatase: 102 U/L (ref 39–117)
Bilirubin, Direct: 0.2 mg/dL (ref 0.0–0.3)
Total Bilirubin: 1.1 mg/dL (ref 0.2–1.2)
Total Protein: 7 g/dL (ref 6.0–8.3)

## 2020-08-03 LAB — CBC WITH DIFFERENTIAL/PLATELET
Basophils Absolute: 0 10*3/uL (ref 0.0–0.1)
Basophils Relative: 0.6 % (ref 0.0–3.0)
Eosinophils Absolute: 0.2 10*3/uL (ref 0.0–0.7)
Eosinophils Relative: 2.4 % (ref 0.0–5.0)
HCT: 36.8 % (ref 36.0–46.0)
Hemoglobin: 12.4 g/dL (ref 12.0–15.0)
Lymphocytes Relative: 29.8 % (ref 12.0–46.0)
Lymphs Abs: 1.9 10*3/uL (ref 0.7–4.0)
MCHC: 33.8 g/dL (ref 30.0–36.0)
MCV: 89.3 fl (ref 78.0–100.0)
Monocytes Absolute: 0.6 10*3/uL (ref 0.1–1.0)
Monocytes Relative: 10.4 % (ref 3.0–12.0)
Neutro Abs: 3.5 10*3/uL (ref 1.4–7.7)
Neutrophils Relative %: 56.8 % (ref 43.0–77.0)
Platelets: 270 10*3/uL (ref 150.0–400.0)
RBC: 4.12 Mil/uL (ref 3.87–5.11)
RDW: 13.7 % (ref 11.5–15.5)
WBC: 6.2 10*3/uL (ref 4.0–10.5)

## 2020-08-03 LAB — IBC PANEL
Iron: 76 ug/dL (ref 42–145)
Saturation Ratios: 22.6 % (ref 20.0–50.0)
Transferrin: 240 mg/dL (ref 212.0–360.0)

## 2020-08-03 LAB — TSH: TSH: 2.65 u[IU]/mL (ref 0.35–5.50)

## 2020-08-03 LAB — VITAMIN B12: Vitamin B-12: 403 pg/mL (ref 211–911)

## 2020-08-03 LAB — HEMOGLOBIN A1C: Hgb A1c MFr Bld: 5.5 % (ref 4.6–6.5)

## 2020-08-03 NOTE — Patient Instructions (Signed)
Checking anemia blood sugar  and cholesterol.   B12 and iron levels .   Wt Readings from Last 3 Encounters:  08/03/20 182 lb 3.2 oz (82.6 kg)  07/25/19 178 lb 3.2 oz (80.8 kg)  08/07/18 176 lb 9.6 oz (80.1 kg)    Consider tracking programs like weight watchers . As we discussed .    Health Maintenance, Female Adopting a healthy lifestyle and getting preventive care are important in promoting health and wellness. Ask your health care provider about: The right schedule for you to have regular tests and exams. Things you can do on your own to prevent diseases and keep yourself healthy. What should I know about diet, weight, and exercise? Eat a healthy diet  Eat a diet that includes plenty of vegetables, fruits, low-fat dairy products, and lean protein. Do not eat a lot of foods that are high in solid fats, added sugars, or sodium.  Maintain a healthy weight Body mass index (BMI) is used to identify weight problems. It estimates body fat based on height and weight. Your health care provider can help determineyour BMI and help you achieve or maintain a healthy weight. Get regular exercise Get regular exercise. This is one of the most important things you can do for your health. Most adults should: Exercise for at least 150 minutes each week. The exercise should increase your heart rate and make you sweat (moderate-intensity exercise). Do strengthening exercises at least twice a week. This is in addition to the moderate-intensity exercise. Spend less time sitting. Even light physical activity can be beneficial. Watch cholesterol and blood lipids Have your blood tested for lipids and cholesterol at 74 years of age, then havethis test every 5 years. Have your cholesterol levels checked more often if: Your lipid or cholesterol levels are high. You are older than 74 years of age. You are at high risk for heart disease. What should I know about cancer screening? Depending on your health  history and family history, you may need to have cancer screening at various ages. This may include screening for: Breast cancer. Cervical cancer. Colorectal cancer. Skin cancer. Lung cancer. What should I know about heart disease, diabetes, and high blood pressure? Blood pressure and heart disease High blood pressure causes heart disease and increases the risk of stroke. This is more likely to develop in people who have high blood pressure readings, are of African descent, or are overweight. Have your blood pressure checked: Every 3-5 years if you are 73-25 years of age. Every year if you are 45 years old or older. Diabetes Have regular diabetes screenings. This checks your fasting blood sugar level. Have the screening done: Once every three years after age 55 if you are at a normal weight and have a low risk for diabetes. More often and at a younger age if you are overweight or have a high risk for diabetes. What should I know about preventing infection? Hepatitis B If you have a higher risk for hepatitis B, you should be screened for this virus. Talk with your health care provider to find out if you are at risk forhepatitis B infection. Hepatitis C Testing is recommended for: Everyone born from [birth year] through 1964. Anyone with known risk factors for hepatitis C. Sexually transmitted infections (STIs) Get screened for STIs, including gonorrhea and chlamydia, if: You are sexually active and are younger than 74 years of age. You are older than 74 years of age and your health care provider tells you that you  are at risk for this type of infection. Your sexual activity has changed since you were last screened, and you are at increased risk for chlamydia or gonorrhea. Ask your health care provider if you are at risk. Ask your health care provider about whether you are at high risk for HIV. Your health care provider may recommend a prescription medicine to help prevent HIV infection. If you  choose to take medicine to prevent HIV, you should first get tested for HIV. You should then be tested every 3 months for as long as you are taking the medicine. Pregnancy If you are about to stop having your period (premenopausal) and you may become pregnant, seek counseling before you get pregnant. Take 400 to 800 micrograms (mcg) of folic acid every day if you become pregnant. Ask for birth control (contraception) if you want to prevent pregnancy. Osteoporosis and menopause Osteoporosis is a disease in which the bones lose minerals and strength with aging. This can result in bone fractures. If you are 68 years old or older, or if you are at risk for osteoporosis and fractures, ask your health care provider if you should: Be screened for bone loss. Take a calcium or vitamin D supplement to lower your risk of fractures. Be given hormone replacement therapy (HRT) to treat symptoms of menopause. Follow these instructions at home: Lifestyle Do not use any products that contain nicotine or tobacco, such as cigarettes, e-cigarettes, and chewing tobacco. If you need help quitting, ask your health care provider. Do not use street drugs. Do not share needles. Ask your health care provider for help if you need support or information about quitting drugs. Alcohol use Do not drink alcohol if: Your health care provider tells you not to drink. You are pregnant, may be pregnant, or are planning to become pregnant. If you drink alcohol: Limit how much you use to 0-1 drink a day. Limit intake if you are breastfeeding. Be aware of how much alcohol is in your drink. In the [place] one drink equals one 12 oz bottle of beer (355 mL), one 5 oz glass of wine (148 mL), or one 1 oz glass of hard liquor (44 mL). General instructions Schedule regular health, dental, and eye exams. Stay current with your vaccines. Tell your health care provider if: You often feel depressed. You have ever been abused or do not feel  safe at home. Summary Adopting a healthy lifestyle and getting preventive care are important in promoting health and wellness. Follow your health care provider's instructions about healthy diet, exercising, and getting tested or screened for diseases. Follow your health care provider's instructions on monitoring your cholesterol and blood pressure. This information is not intended to replace advice given to you by your health care provider. Make sure you discuss any questions you have with your healthcare provider. Document Revised: 01/02/2018 Document Reviewed: 01/02/2018 Elsevier Patient Education  2022 [employer].

## 2020-08-05 NOTE — Progress Notes (Signed)
Blood results are  good   cholesterol slightly up   but ratio still favorable   :no diabetes and anemia is resolved

## 2020-08-10 ENCOUNTER — Telehealth: Payer: Self-pay | Admitting: Internal Medicine

## 2020-08-10 NOTE — Telephone Encounter (Signed)
error 

## 2020-08-11 ENCOUNTER — Ambulatory Visit: Payer: Medicare Other

## 2020-08-16 ENCOUNTER — Ambulatory Visit (INDEPENDENT_AMBULATORY_CARE_PROVIDER_SITE_OTHER): Payer: Medicare Other

## 2020-08-16 ENCOUNTER — Other Ambulatory Visit: Payer: Self-pay

## 2020-08-16 VITALS — BP 128/72 | HR 72 | Temp 98.1°F | Ht 63.0 in | Wt 184.0 lb

## 2020-08-16 DIAGNOSIS — Z0000 Encounter for general adult medical examination without abnormal findings: Secondary | ICD-10-CM

## 2020-08-16 NOTE — Patient Instructions (Signed)
Cathy Meyers , Thank you for taking time to come for your Medicare Wellness Visit. I appreciate your ongoing commitment to your health goals. Please review the following plan we discussed and let me know if I can assist you in the future.   Screening recommendations/referrals: Colonoscopy: 09/14/2016-  2028 Mammogram: 10/01/2019 Bone Density: 12/05/2019 Recommended yearly ophthalmology/optometry visit for glaucoma screening and checkup Recommended yearly dental visit for hygiene and checkup  Vaccinations: Influenza vaccine: due in fall 2022  Pneumococcal vaccine: completed series  Tdap vaccine: due with injury  Shingles vaccine: completed series     Advanced directives: will provide copies   Conditions/risks identified: none   Next appointment: none    Preventive Care 65 Years and Older, Female Preventive care refers to lifestyle choices and visits with your health care provider that can promote health and wellness. What does preventive care include? A yearly physical exam. This is also called an annual well check. Dental exams once or twice a year. Routine eye exams. Ask your health care provider how often you should have your eyes checked. Personal lifestyle choices, including: Daily care of your teeth and gums. Regular physical activity. Eating a healthy diet. Avoiding tobacco and drug use. Limiting alcohol use. Practicing safe sex. Taking low-dose aspirin every day. Taking vitamin and mineral supplements as recommended by your health care provider. What happens during an annual well check? The services and screenings done by your health care provider during your annual well check will depend on your age, overall health, lifestyle risk factors, and family history of disease. Counseling  Your health care provider may ask you questions about your: Alcohol use. Tobacco use. Drug use. Emotional well-being. Home and relationship well-being. Sexual activity. Eating  habits. History of falls. Memory and ability to understand (cognition). Work and work Astronomer. Reproductive health. Screening  You may have the following tests or measurements: Height, weight, and BMI. Blood pressure. Lipid and cholesterol levels. These may be checked every 5 years, or more frequently if you are over 89 years old. Skin check. Lung cancer screening. You may have this screening every year starting at age 9 if you have a 30-pack-year history of smoking and currently smoke or have quit within the past 15 years. Fecal occult blood test (FOBT) of the stool. You may have this test every year starting at age 1. Flexible sigmoidoscopy or colonoscopy. You may have a sigmoidoscopy every 5 years or a colonoscopy every 10 years starting at age 19. Hepatitis C blood test. Hepatitis B blood test. Sexually transmitted disease (STD) testing. Diabetes screening. This is done by checking your blood sugar (glucose) after you have not eaten for a while (fasting). You may have this done every 1-3 years. Bone density scan. This is done to screen for osteoporosis. You may have this done starting at age 17. Mammogram. This may be done every 1-2 years. Talk to your health care provider about how often you should have regular mammograms. Talk with your health care provider about your test results, treatment options, and if necessary, the need for more tests. Vaccines  Your health care provider may recommend certain vaccines, such as: Influenza vaccine. This is recommended every year. Tetanus, diphtheria, and acellular pertussis (Tdap, Td) vaccine. You may need a Td booster every 10 years. Zoster vaccine. You may need this after age 86. Pneumococcal 13-valent conjugate (PCV13) vaccine. One dose is recommended after age 50. Pneumococcal polysaccharide (PPSV23) vaccine. One dose is recommended after age 70. Talk to your health care  provider about which screenings and vaccines you need and how  often you need them. This information is not intended to replace advice given to you by your health care provider. Make sure you discuss any questions you have with your health care provider. Document Released: 02/05/2015 Document Revised: 09/29/2015 Document Reviewed: 11/10/2014 Elsevier Interactive Patient Education  2017 [place] Prevention in the Home Falls can cause injuries. They can happen to people of all ages. There are many things you can do to make your home safe and to help prevent falls. What can I do on the outside of my home? Regularly fix the edges of walkways and driveways and fix any cracks. Remove anything that might make you trip as you walk through a door, such as a raised step or threshold. Trim any bushes or trees on the path to your home. Use bright outdoor lighting. Clear any walking paths of anything that might make someone trip, such as rocks or tools. Regularly check to see if handrails are loose or broken. Make sure that both sides of any steps have handrails. Any raised decks and porches should have guardrails on the edges. Have any leaves, snow, or ice cleared regularly. Use sand or salt on walking paths during winter. Clean up any spills in your garage right away. This includes oil or grease spills. What can I do in the bathroom? Use night lights. Install grab bars by the toilet and in the tub and shower. Do not use towel bars as grab bars. Use non-skid mats or decals in the tub or shower. If you need to sit down in the shower, use a plastic, non-slip stool. Keep the floor dry. Clean up any water that spills on the floor as soon as it happens. Remove soap buildup in the tub or shower regularly. Attach bath mats securely with double-sided non-slip rug tape. Do not have throw rugs and other things on the floor that can make you trip. What can I do in the bedroom? Use night lights. Make sure that you have a light by your bed that is easy to  reach. Do not use any sheets or blankets that are too big for your bed. They should not hang down onto the floor. Have a firm chair that has side arms. You can use this for support while you get dressed. Do not have throw rugs and other things on the floor that can make you trip. What can I do in the kitchen? Clean up any spills right away. Avoid walking on wet floors. Keep items that you use a lot in easy-to-reach places. If you need to reach something above you, use a strong step stool that has a grab bar. Keep electrical cords out of the way. Do not use floor polish or wax that makes floors slippery. If you must use wax, use non-skid floor wax. Do not have throw rugs and other things on the floor that can make you trip. What can I do with my stairs? Do not leave any items on the stairs. Make sure that there are handrails on both sides of the stairs and use them. Fix handrails that are broken or loose. Make sure that handrails are as long as the stairways. Check any carpeting to make sure that it is firmly attached to the stairs. Fix any carpet that is loose or worn. Avoid having throw rugs at the top or bottom of the stairs. If you do have throw rugs, attach them to the  floor with carpet tape. Make sure that you have a light switch at the top of the stairs and the bottom of the stairs. If you do not have them, ask someone to add them for you. What else can I do to help prevent falls? Wear shoes that: Do not have high heels. Have rubber bottoms. Are comfortable and fit you well. Are closed at the toe. Do not wear sandals. If you use a stepladder: Make sure that it is fully opened. Do not climb a closed stepladder. Make sure that both sides of the stepladder are locked into place. Ask someone to hold it for you, if possible. Clearly mark and make sure that you can see: Any grab bars or handrails. First and last steps. Where the edge of each step is. Use tools that help you move  around (mobility aids) if they are needed. These include: Canes. Walkers. Scooters. Crutches. Turn on the lights when you go into a dark area. Replace any light bulbs as soon as they burn out. Set up your furniture so you have a clear path. Avoid moving your furniture around. If any of your floors are uneven, fix them. If there are any pets around you, be aware of where they are. Review your medicines with your doctor. Some medicines can make you feel dizzy. This can increase your chance of falling. Ask your doctor what other things that you can do to help prevent falls. This information is not intended to replace advice given to you by your health care provider. Make sure you discuss any questions you have with your health care provider. Document Released: 11/05/2008 Document Revised: 06/17/2015 Document Reviewed: 02/13/2014 Elsevier Interactive Patient Education  2017 [employer].

## 2020-08-16 NOTE — Progress Notes (Signed)
Subjective:   Cathy Meyers is a 75 y.o. female who presents for Medicare Annual (Subsequent) preventive examination.  Review of Systems    N/a       Objective:    There were no vitals filed for this visit. There is no height or weight on file to calculate BMI.  Advanced Directives 08/22/2019 08/08/2017 02/28/2017 02/25/2016  Does Patient Have a Medical Advance Directive? Yes Yes No (No Data)  Type of Estate agent of [place];Living will - - -  Does patient want to make changes to medical advance directive? No - Patient declined - - -  Copy of Healthcare Power of Attorney in Chart? No - copy requested - - -  Would patient like information on creating a medical advance directive? - - Yes (MAU/Ambulatory/Procedural Areas - Information given) -    Current Medications (verified) Outpatient Encounter Medications as of 08/16/2020  Medication Sig   Ascorbic Acid (VITAMIN C) 1000 MG tablet Vitamin C   Cholecalciferol (VITAMIN D) 50 MCG (2000 UT) CAPS Vitamin D   fluticasone (FLONASE) 50 MCG/ACT nasal spray SPRAY 2 SPRAYS INTO EACH NOSTRIL EVERY DAY   ibuprofen (ADVIL,MOTRIN) 600 MG tablet Take 1 tablet (600 mg total) by mouth every 6 (six) hours as needed.   Multiple Vitamin (MULTIVITAMIN) tablet Take 1 tablet by mouth daily.     naproxen sodium (ALEVE) 220 MG tablet Take 440 mg by mouth daily as needed (for headache).   No facility-administered encounter medications on file as of 08/16/2020.    Allergies (verified) Penicillins   History: Past Medical History:  Diagnosis Date   Allergy    Anemia    Chronic kidney disease    Gastritis    EGD neg bx 2011    Headache    otc med prn   Hiatal hernia    small on egd   History of varicella    Hx: UTI (urinary tract infection)    pyelo   Hyperlipidemia    diet controlled    Pyelonephritis 06/04/2010   Shingles 11/24/2011   Classic typical symptoms expectant management and treatment begin antiviral.  Discussed pain control options    Sickle cell trait (HCC)    SVD (spontaneous vaginal delivery)    x 3   Past Surgical History:  Procedure Laterality Date   COLONOSCOPY     DILATION AND CURETTAGE OF UTERUS  06/2016   HYSTEROSCOPY WITH D & C N/A 03/02/2017   Procedure: DILATATION AND CURETTAGE /HYSTEROSCOPY;  Surgeon: Mitchel Honour, DO;  Location: [facility];  Service: Gynecology;  Laterality: N/A;   UPPER GASTROINTESTINAL ENDOSCOPY     WISDOM TOOTH EXTRACTION     Family History  Adopted: Yes  Problem Relation Age of Onset   Hypertension Mother        family hx   Heart failure Mother    Colon cancer Neg Hx    Colon polyps Neg Hx    Esophageal cancer Neg Hx    Rectal cancer Neg Hx    Stomach cancer Neg Hx    Social History   Socioeconomic History   Marital status: Married    Spouse name: Not on file   Number of children: Not on file   Years of education: Not on file   Highest education level: Not on file  Occupational History   Not on file  Tobacco Use   Smoking status: Former    Packs/day: 0.10    Types: Cigarettes    Quit  date: 371998    Years since quitting: 24.5   Smokeless tobacco: Never   Tobacco comments:    smoked in school periodically   Vaping Use   Vaping Use: Never used  Substance and Sexual Activity   Alcohol use: Yes    Comment: socially   Drug use: No   Sexual activity: Not Currently    Birth control/protection: Post-menopausal  Other Topics Concern   Not on file  Social History Narrative   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   Hx of divorce    No pets    Raised by family member not mom   [place]   G3P3   HHof 2  Husband has DM   - 7 hours of sleep    Social Determinants of Health   Financial Resource Strain: Low Risk    Difficulty of Paying Living Expenses: Not hard at all  Food Insecurity: No Food Insecurity   Worried About Programme researcher, broadcasting/film/videounning Out of Food in the Last Year: Never true   Ran Out of Food in the Last Year:  Never true  Transportation Needs: No Transportation Needs   Lack of Transportation (Medical): No   Lack of Transportation (Non-Medical): No  Physical Activity: Inactive   Days of Exercise per Week: 0 days   Minutes of Exercise per Session: 0 min  Stress: No Stress Concern Present   Feeling of Stress : Not at all  Social Connections: Socially Integrated   Frequency of Communication with Friends and Family: More than three times a week   Frequency of Social Gatherings with Friends and Family: More than three times a week   Attends Religious Services: More than 4 times per year   Active Member of [employer] Financial[employer]ubs or Organizations: Yes   Attends Engineer, structuralClub or Organization Meetings: More than 4 times per year   Marital Status: Married    Tobacco Counseling Counseling given: Not Answered Tobacco comments: smoked in school periodically    Clinical Intake:                 Diabetic?no         Activities of Daily Living In your present state of health, do you have any difficulty performing the following activities: 08/22/2019  Hearing? N  Vision? N  Difficulty concentrating or making decisions? N  Walking or climbing stairs? Y  Comment Gets an occasional ache in her knees  Dressing or bathing? N  Doing errands, shopping? N  Preparing Food and eating ? N  Using the Toilet? N  In the past six months, have you accidently leaked urine? N  Do you have problems with loss of bowel control? N  Managing your Medications? N  Managing your Finances? N  Housekeeping or managing your Housekeeping? N  Some recent data might be hidden    Patient Care Team: Panosh, Neta MendsWanda K, MD as PCP - General Meryl DareStark, Cathy T, MD as Consulting Physician (Gastroenterology) Mitchel HonourMorris, Megan, DO as Consulting Physician (Obstetrics and Gynecology)  Indicate any recent Medical Services you may have received from other than [facility] providers in the past year (date may be approximate).     Assessment:   This is a  routine wellness examination for [place].  Hearing/Vision screen No results found.  Dietary issues and exercise activities discussed:     Goals Addressed   None    Depression Screen PHQ 2/9 Scores 08/03/2020 08/22/2019 07/25/2019 08/08/2017 04/18/2017 03/01/2016 02/25/2016  PHQ - 2 Score 0  0 0 0 0 0 0  PHQ- 9 Score 2 0 0 - - - -    Fall Risk Fall Risk  08/22/2019 07/25/2019 08/07/2018 08/08/2017 04/18/2017  Falls in the past year? 0 0 0 No No  Number falls in past yr: 0 - - - -  Injury with Fall? 0 - - - -  Risk for fall due to : (No Data) - - - -  Risk for fall due to: Comment occassional vertigo - - - -  Follow up Falls evaluation completed;Falls prevention discussed - - - -    FALL RISK PREVENTION PERTAINING TO THE HOME:  Any stairs in or around the home? No  If so, are there any without handrails? No  Home free of loose throw rugs in walkways, pet beds, electrical cords, etc? Yes  Adequate lighting in your home to reduce risk of falls? Yes   ASSISTIVE DEVICES UTILIZED TO PREVENT FALLS:  Life alert? No  Use of a cane, walker or w/c? No  Grab bars in the bathroom? No  Shower chair or bench in shower? No  Elevated toilet seat or a handicapped toilet? No   TIMED UP AND GO:  Was the test performed? Yes .  Length of time to ambulate 10 feet: 7 sec.   Gait steady and fast without use of assistive device  Cognitive Function: Normal cognitive status assessed by direct observation by this Nurse Health Advisor. No abnormalities found.   MMSE - Mini Mental State Exam 08/08/2017  Not completed: (No Data)        Immunizations Immunization History  Administered Date(s) Administered   Influenza Split 11/21/2011, 10/24/2013   Influenza, High Dose Seasonal PF 09/24/2015, 10/05/2016   Influenza,inj,Quad PF,6+ Mos 10/25/2012, 10/09/2014   PFIZER(Purple Top)SARS-COV-2 Vaccination 02/10/2019, 02/28/2019   Pneumococcal Conjugate-13 10/08/2013   Pneumococcal Polysaccharide-23 08/14/2012    Td 01/24/2004   Zoster Recombinat (Shingrix) 05/16/2017, 08/07/2018, 12/12/2018   Zoster, Live 05/02/2011    TDAP status: Due, Education has been provided regarding the importance of this vaccine. Advised may receive this vaccine at local pharmacy or Health Dept. Aware to provide a copy of the vaccination record if obtained from local pharmacy or Health Dept. Verbalized acceptance and understanding.  Flu Vaccine status: Up to date  Pneumococcal vaccine status: Up to date  Covid-19 vaccine status: Completed vaccines  Qualifies for Shingles Vaccine? Yes   Zostavax completed No   Shingrix Completed?: No.    Education has been provided regarding the importance of this vaccine. Patient has been advised to call insurance company to determine out of pocket expense if they have not yet received this vaccine. Advised may also receive vaccine at local pharmacy or Health Dept. Verbalized acceptance and understanding.  Screening Tests Health Maintenance  Topic Date Due   TETANUS/TDAP  01/23/2014   COVID-19 Vaccine (3 - Pfizer risk series) 03/28/2019   INFLUENZA VACCINE  08/23/2020   MAMMOGRAM  09/30/2021   COLONOSCOPY (Pts 45-27yrs Insurance coverage will need to be confirmed)  09/15/2026   DEXA SCAN  Completed   Hepatitis C Screening  Completed   PNA vac Low Risk Adult  Completed   Zoster Vaccines- Shingrix  Completed   HPV VACCINES  Aged Out    Health Maintenance  Health Maintenance Due  Topic Date Due   TETANUS/TDAP  01/23/2014   COVID-19 Vaccine (3 - Pfizer risk series) 03/28/2019    Colorectal cancer screening: Type of screening: Colonoscopy. Completed 09/14/2016. Repeat every 10 years  Mammogram status: Completed 10/01/2019. Repeat every year  Bone Density status: Completed 12/05/2019. Results reflect: Bone density results: NORMAL. Repeat every 5 years.  Lung Cancer Screening: (Low Dose CT Chest recommended if Age 56-80 years, 30 pack-year currently smoking OR have quit w/in  15years.) does not qualify.   Lung Cancer Screening Referral: n/a  Additional Screening:  Hepatitis C Screening: does not qualify; Completed 02/25/2016  Vision Screening: Recommended annual ophthalmology exams for early detection of glaucoma and other disorders of the eye. Is the patient up to date with their annual eye exam?  Yes  Who is the provider or what is the name of the office in which the patient attends annual eye exams? Dr.Bowen  If pt is not established with a provider, would they like to be referred to a provider to establish care? No .   Dental Screening: Recommended annual dental exams for proper oral hygiene  Community Resource Referral / Chronic Care Management: CRR required this visit?  No   CCM required this visit?  No      Plan:     I have personally reviewed and noted the following in the patient's chart:   Medical and social history Use of alcohol, tobacco or illicit drugs  Current medications and supplements including opioid prescriptions.  Functional ability and status Nutritional status Physical activity Advanced directives List of other physicians Hospitalizations, surgeries, and ER visits in previous 12 months Vitals Screenings to include cognitive, depression, and falls Referrals and appointments  In addition, I have reviewed and discussed with patient certain preventive protocols, quality metrics, and best practice recommendations. A written personalized care plan for preventive services as well as general preventive health recommendations were provided to patient.     March Rummage, LPN   [id number]/[id number]   Nurse Notes: none

## 2020-08-23 ENCOUNTER — Other Ambulatory Visit: Payer: Self-pay | Admitting: Internal Medicine

## 2020-08-23 DIAGNOSIS — Z1231 Encounter for screening mammogram for malignant neoplasm of breast: Secondary | ICD-10-CM

## 2020-09-14 ENCOUNTER — Telehealth: Payer: Self-pay

## 2020-09-14 LAB — HEMOGLOBIN A1C: Hemoglobin A1C: 6.9

## 2020-09-14 NOTE — Telephone Encounter (Signed)
Eustaquio Boyden NP from [facility] called to report patient A1C is 6.9   Call back # [phone]

## 2020-09-15 ENCOUNTER — Encounter: Payer: Self-pay | Admitting: Internal Medicine

## 2020-09-15 NOTE — Telephone Encounter (Signed)
Results abstracted

## 2020-10-12 IMAGING — MG DIGITAL SCREENING BILAT W/ TOMO W/ CAD
6 of 10 series · 6 of 30 positions shown · non-contrast
Comparison: Previous exam(s).

CLINICAL DATA: Screening.

EXAM:
DIGITAL SCREENING BILATERAL MAMMOGRAM WITH TOMO AND CAD

[R MLO synth-2D (1 of 2)]
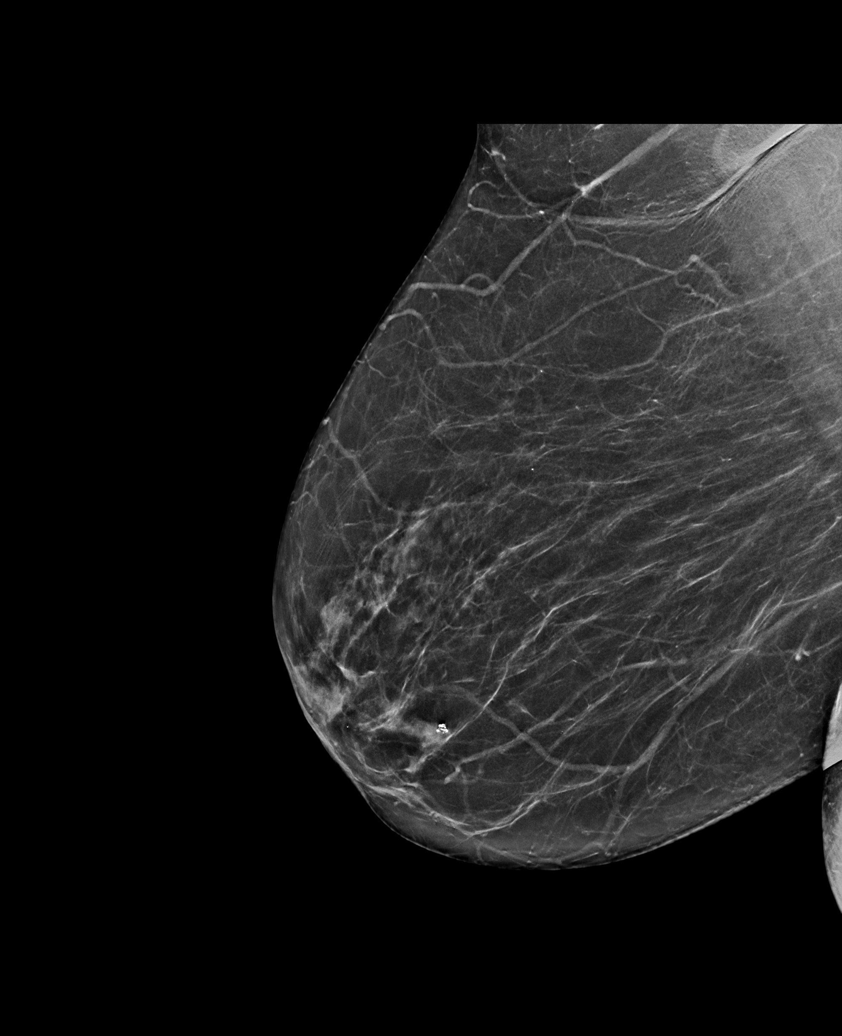

[R CC synth-2D]
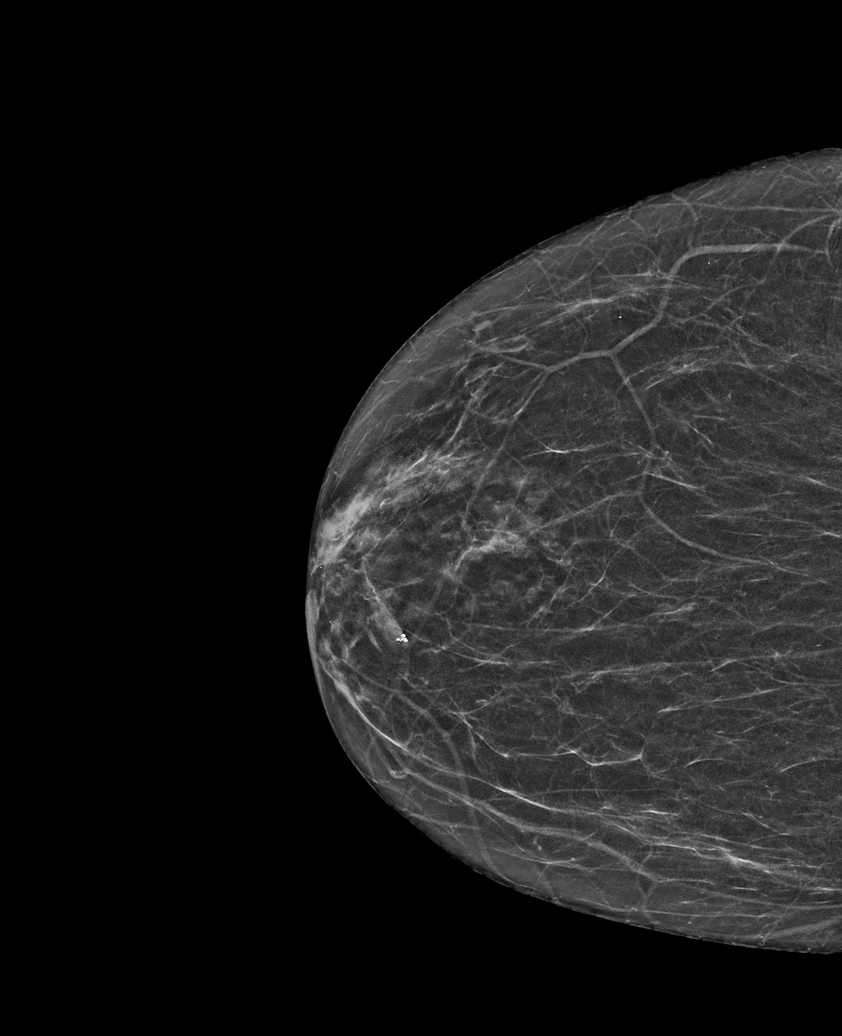

[L CC synth-2D]
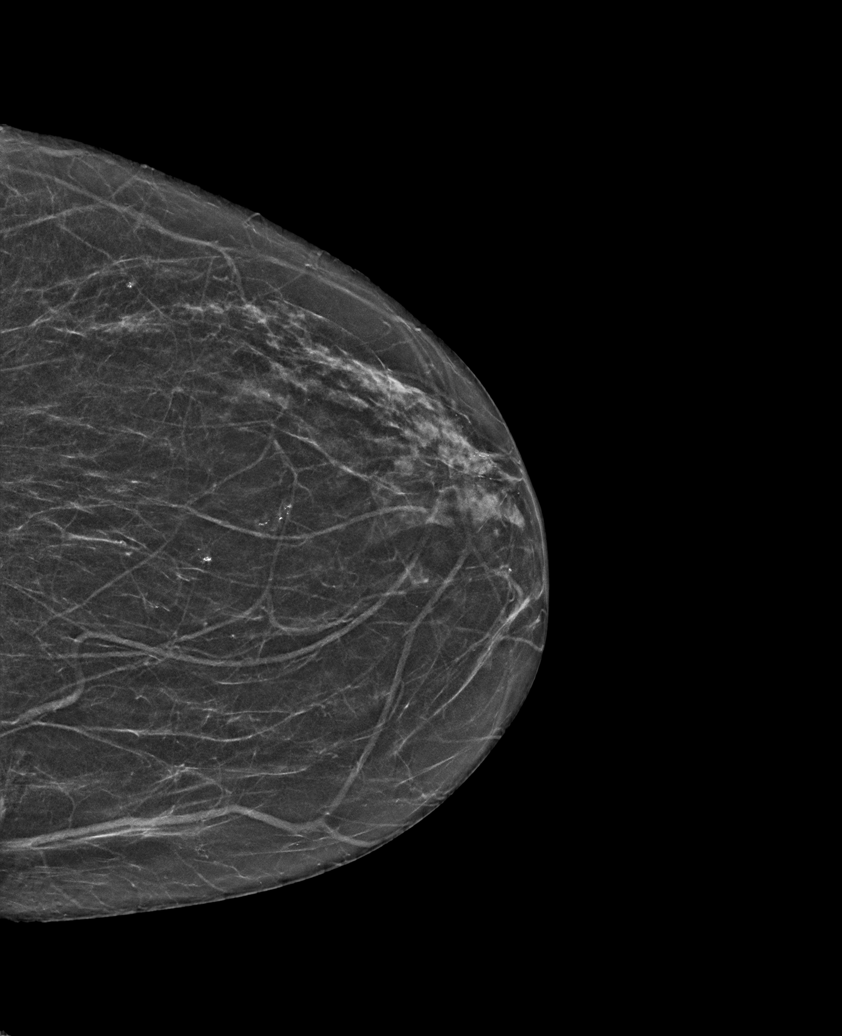

[R MLO synth-2D (2 of 2)]
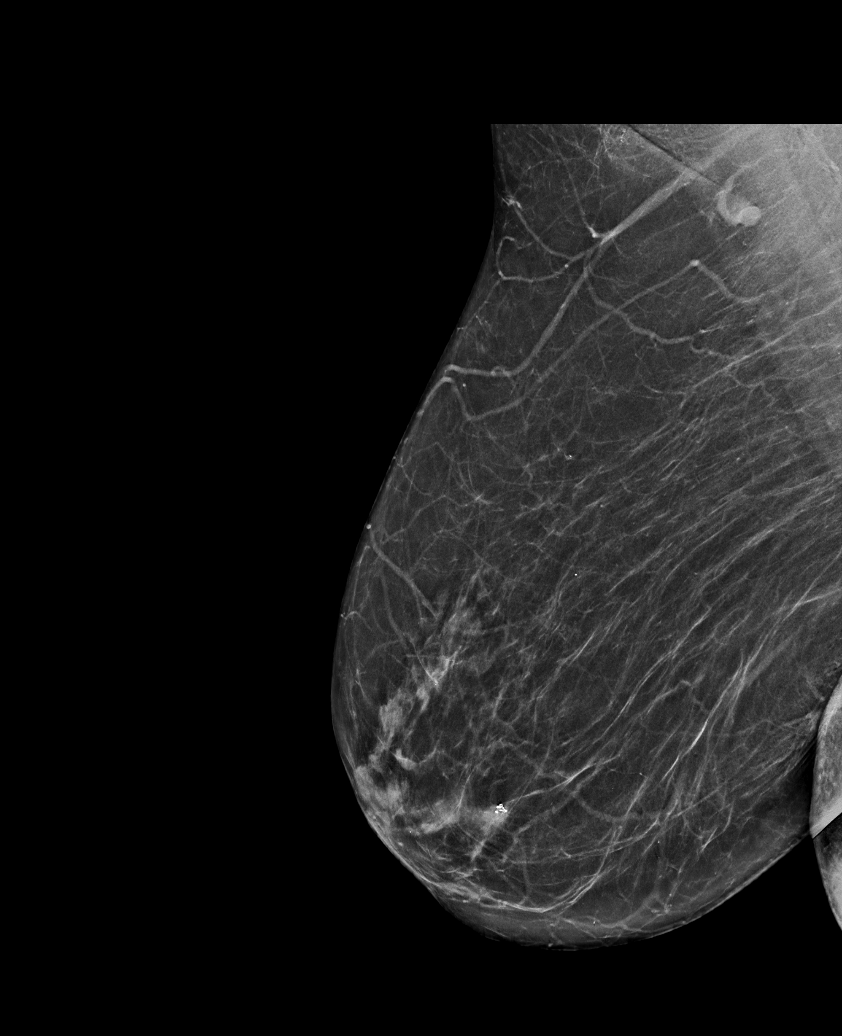

[L MLO synth-2D]
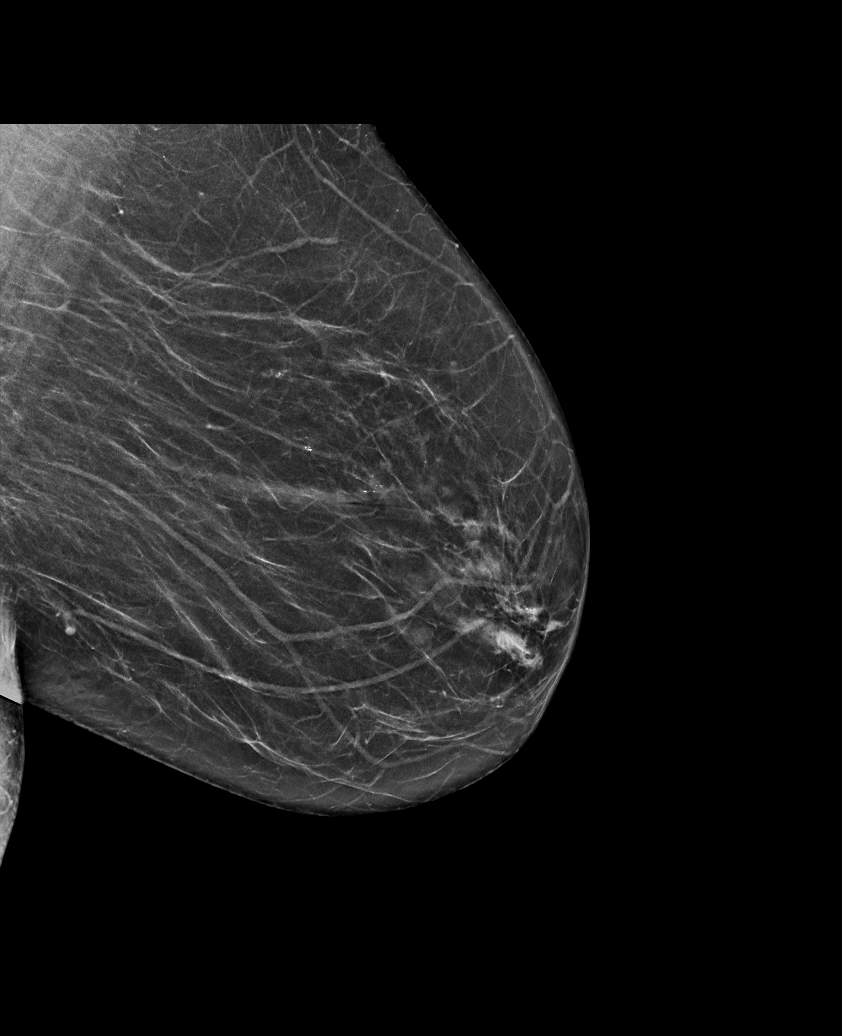

[R CC tomo · tomo slice 25/50.0]
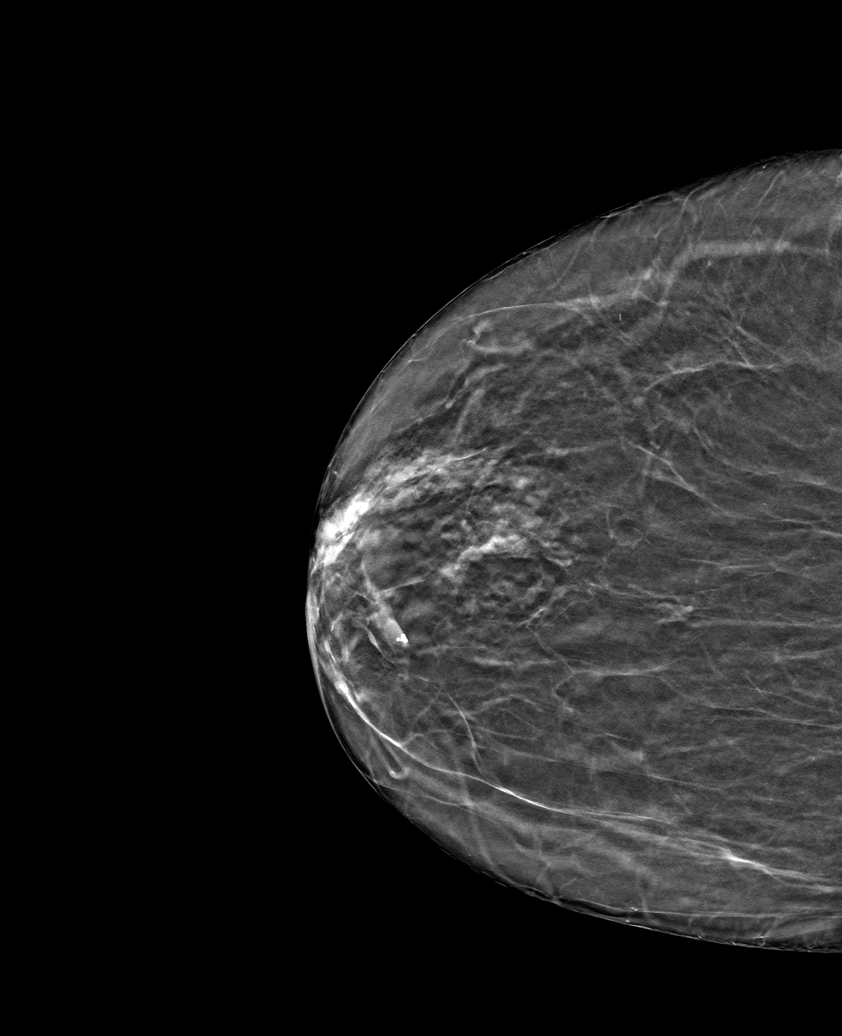

[6 of 30 positions shown; findings below may reference images not displayed]

ACR Breast Density Category b: There are scattered areas of
fibroglandular density.
FINDINGS: There are no findings suspicious for malignancy. Images were
processed with CAD.
IMPRESSION: No mammographic evidence of malignancy. A result letter of this
screening mammogram will be mailed directly to the patient.

RECOMMENDATION:
Screening mammogram in one year. (Code:[id number])

BI-RADS CATEGORY  1: Negative.

## 2020-10-13 ENCOUNTER — Ambulatory Visit
Admission: RE | Admit: 2020-10-13 | Discharge: 2020-10-13 | Disposition: A | Discharge status: Home / Self Care | Payer: Medicare Other | Source: Ambulatory Visit | Attending: Internal Medicine | Admitting: Internal Medicine

## 2020-10-13 ENCOUNTER — Other Ambulatory Visit: Payer: Self-pay

## 2020-10-13 DIAGNOSIS — Z1231 Encounter for screening mammogram for malignant neoplasm of breast: Secondary | ICD-10-CM

## 2021-08-08 ENCOUNTER — Ambulatory Visit (INDEPENDENT_AMBULATORY_CARE_PROVIDER_SITE_OTHER): Payer: Medicare Other | Admitting: Internal Medicine

## 2021-08-08 ENCOUNTER — Encounter: Payer: Self-pay | Admitting: Internal Medicine

## 2021-08-08 VITALS — BP 120/76 | HR 73 | Temp 98.1°F | Ht 63.0 in | Wt 186.6 lb

## 2021-08-08 DIAGNOSIS — M1710 Unilateral primary osteoarthritis, unspecified knee: Secondary | ICD-10-CM

## 2021-08-08 DIAGNOSIS — Z79899 Other long term (current) drug therapy: Secondary | ICD-10-CM

## 2021-08-08 DIAGNOSIS — Z0000 Encounter for general adult medical examination without abnormal findings: Secondary | ICD-10-CM | POA: Diagnosis not present

## 2021-08-08 DIAGNOSIS — D573 Sickle-cell trait: Secondary | ICD-10-CM

## 2021-08-08 DIAGNOSIS — E7800 Pure hypercholesterolemia, unspecified: Secondary | ICD-10-CM

## 2021-08-08 LAB — HEPATIC FUNCTION PANEL
ALT: 12 U/L (ref 0–35)
AST: 21 U/L (ref 0–37)
Albumin: 4.3 g/dL (ref 3.5–5.2)
Alkaline Phosphatase: 98 U/L (ref 39–117)
Bilirubin, Direct: 0.1 mg/dL (ref 0.0–0.3)
Total Bilirubin: 0.8 mg/dL (ref 0.2–1.2)
Total Protein: 7.4 g/dL (ref 6.0–8.3)

## 2021-08-08 LAB — CBC WITH DIFFERENTIAL/PLATELET
Basophils Absolute: 0 10*3/uL (ref 0.0–0.1)
Basophils Relative: 0.4 % (ref 0.0–3.0)
Eosinophils Absolute: 0.1 10*3/uL (ref 0.0–0.7)
Eosinophils Relative: 2.2 % (ref 0.0–5.0)
HCT: 37.1 % (ref 36.0–46.0)
Hemoglobin: 12.1 g/dL (ref 12.0–15.0)
Lymphocytes Relative: 31.7 % (ref 12.0–46.0)
Lymphs Abs: 2 10*3/uL (ref 0.7–4.0)
MCHC: 32.8 g/dL (ref 30.0–36.0)
MCV: 89.4 fl (ref 78.0–100.0)
Monocytes Absolute: 0.6 10*3/uL (ref 0.1–1.0)
Monocytes Relative: 9.9 % (ref 3.0–12.0)
Neutro Abs: 3.6 10*3/uL (ref 1.4–7.7)
Neutrophils Relative %: 55.8 % (ref 43.0–77.0)
Platelets: 268 10*3/uL (ref 150.0–400.0)
RBC: 4.14 Mil/uL (ref 3.87–5.11)
RDW: 14.2 % (ref 11.5–15.5)
WBC: 6.4 10*3/uL (ref 4.0–10.5)

## 2021-08-08 LAB — TSH: TSH: 2.52 u[IU]/mL (ref 0.35–5.50)

## 2021-08-08 LAB — LIPID PANEL
Cholesterol: 188 mg/dL (ref 0–200)
HDL: 67 mg/dL (ref >39.00)
LDL Cholesterol: 103 mg/dL — ABNORMAL HIGH (ref 0–99)
NonHDL: 121.17
Total CHOL/HDL Ratio: 3
Triglycerides: 90 mg/dL (ref 0.0–149.0)
VLDL: 18 mg/dL (ref 0.0–40.0)

## 2021-08-08 LAB — BASIC METABOLIC PANEL
BUN: 8 mg/dL (ref 6–23)
CO2: 26 mEq/L (ref 19–32)
Calcium: 9.7 mg/dL (ref 8.4–10.5)
Chloride: 107 mEq/L (ref 96–112)
Creatinine, Ser: 0.83 mg/dL (ref 0.40–1.20)
GFR: 69.15 mL/min (ref >60.00)
Glucose, Bld: 81 mg/dL (ref 70–99)
Potassium: 3.7 mEq/L (ref 3.5–5.1)
Sodium: 143 mEq/L (ref 135–145)

## 2021-08-08 LAB — HEMOGLOBIN A1C: Hgb A1c MFr Bld: 5.7 % (ref 4.6–6.5)

## 2021-08-08 LAB — SEDIMENTATION RATE: Sed Rate: 23 mm/hr (ref 0–30)

## 2021-08-08 LAB — C-REACTIVE PROTEIN: CRP: <1 mg/dL (ref 0.5–20.0)

## 2021-08-08 NOTE — Progress Notes (Signed)
Chief Complaint  Patient presents with   Annual Exam    Not fasting     HPI: Patient  Cathy Meyers  76 y.o. comes in today for Preventive Health Care visit  Is doing pretty well except she is having more frequent pain in her knees right more than left and they act up is using an over-the-counter topical she got on [employer] with some help but does not seem to be working as well Tylenol no help asked for advice Did see orthopedist a long time ago who advised gave her some medicine that helped short-term. She is pretty active no specific injuries recently fever joint swelling or redness.  Health Maintenance  Topic Date Due   TETANUS/TDAP  01/23/2014   COVID-19 Vaccine (5 - Booster) 02/08/2022 (Originally 10/04/2020)   INFLUENZA VACCINE  08/23/2021   MAMMOGRAM  10/13/2021   COLONOSCOPY (Pts 45-83yrs Insurance coverage will need to be confirmed)  09/15/2026   Pneumonia Vaccine 43+ Years old  Completed   DEXA SCAN  Completed   Hepatitis C Screening  Completed   Zoster Vaccines- Shingrix  Completed   HPV VACCINES  Aged Out   Health Maintenance Review LIFESTYLE:  Exercise:   knee   [facility]  one day [facility]  [facility]  [facility]  Tobacco/ETS:n Alcohol: rare  Sugar beverages: Sleep:  7-8  4-5 Drug use: no HH of  3 grandson who is attending college school  Takes vitamin D and see no supplements   ROS:  REST of 12 system review negative except as per HPI   Past Medical History:  Diagnosis Date   Allergy    Anemia    Chronic kidney disease    Gastritis    EGD neg bx 2011    Headache    otc med prn   Hiatal hernia    small on egd   History of varicella    Hx: UTI (urinary tract infection)    pyelo   Hyperlipidemia    diet controlled    Pyelonephritis 06/04/2010   Shingles 11/24/2011   Classic typical symptoms expectant management and treatment begin antiviral. Discussed pain control options    Sickle cell trait (HCC)    SVD  (spontaneous vaginal delivery)    x 3    Past Surgical History:  Procedure Laterality Date   COLONOSCOPY     DILATION AND CURETTAGE OF UTERUS  06/2016   HYSTEROSCOPY WITH D & C N/A 03/02/2017   Procedure: DILATATION AND CURETTAGE /HYSTEROSCOPY;  Surgeon: Mitchel Honour, DO;  Location: [facility];  Service: Gynecology;  Laterality: N/A;   UPPER GASTROINTESTINAL ENDOSCOPY     WISDOM TOOTH EXTRACTION      Family History  Adopted: Yes  Problem Relation Age of Onset   Hypertension Mother        family hx   Heart failure Mother    Colon cancer Neg Hx    Colon polyps Neg Hx    Esophageal cancer Neg Hx    Rectal cancer Neg Hx    Stomach cancer Neg Hx     Social History   Socioeconomic History   Marital status: Married    Spouse name: Not on file   Number of children: Not on file   Years of education: Not on file   Highest education level: Not on file  Occupational History   Not on file  Tobacco Use   Smoking status: Former    Packs/day: 0.10  Types: Cigarettes    Quit date: 31    Years since quitting: 25.5   Smokeless tobacco: Never   Tobacco comments:    smoked in school periodically   Vaping Use   Vaping Use: Never used  Substance and Sexual Activity   Alcohol use: Yes    Comment: socially   Drug use: No   Sexual activity: Not Currently    Birth control/protection: Post-menopausal  Other Topics Concern   Not on file  Social History Narrative   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   Hx of divorce    No pets    Raised by family member not mom   [place]-   G3P3   HHof 2  Husband has DM   - 7 hours of sleep    Social Determinants of Health   Financial Resource Strain: Low Risk  (08/16/2020)   Overall Financial Resource Strain (CARDIA)    Difficulty of Paying Living Expenses: Not hard at all  Food Insecurity: No Food Insecurity (08/16/2020)   Hunger Vital Sign    Worried About Running Out of Food in the Last Year: Never true     Ran Out of Food in the Last Year: Never true  Transportation Needs: No Transportation Needs (08/16/2020)   PRAPARE - Administrator, Civil Service (Medical): No    Lack of Transportation (Non-Medical): No  Physical Activity: Insufficiently Active (08/16/2020)   Exercise Vital Sign    Days of Exercise per Week: 2 days    Minutes of Exercise per Session: 60 min  Stress: No Stress Concern Present (08/16/2020)   [employer] of Occupational Health - Occupational Stress Questionnaire    Feeling of Stress : Not at all  Social Connections: Moderately Integrated (08/16/2020)   Social Connection and Isolation Panel [NHANES]    Frequency of Communication with Friends and Family: Twice a week    Frequency of Social Gatherings with Friends and Family: Twice a week    Attends Religious Services: More than 4 times per year    Active Member of [employer] or Organizations: No    Attends Banker Meetings: Never    Marital Status: Married    Outpatient Medications Prior to Visit  Medication Sig Dispense Refill   Ascorbic Acid (VITAMIN C) 1000 MG tablet Vitamin C     Cholecalciferol (VITAMIN D) 50 MCG (2000 UT) CAPS Vitamin D     fluticasone (FLONASE) 50 MCG/ACT nasal spray SPRAY 2 SPRAYS INTO EACH NOSTRIL EVERY DAY 1 g 3   ibuprofen (ADVIL,MOTRIN) 600 MG tablet Take 1 tablet (600 mg total) by mouth every 6 (six) hours as needed. 30 tablet 0   Multiple Vitamin (MULTIVITAMIN) tablet Take 1 tablet by mouth daily.       naproxen sodium (ALEVE) 220 MG tablet Take 440 mg by mouth daily as needed (for headache).     No facility-administered medications prior to visit.     EXAM:  BP 120/76 (BP Location: Left Arm, Patient Position: Sitting, Cuff Size: Normal)   Pulse 73   Temp 98.1 F (36.7 C) (Oral)   Ht 5\' 3"  (1.6 m)   Wt 186 lb 9.6 oz (84.6 kg)   SpO2 98%   BMI 33.05 kg/m   Body mass index is 33.05 kg/m. Wt Readings from Last 3 Encounters:  08/08/21 186 lb 9.6 oz (84.6  kg)  08/16/20 184 lb (83.5 kg)  08/03/20 182 lb 3.2 oz (82.6  kg)    Physical Exam: Vital signs reviewed ZOX:WRUE is a well-developed well-nourished alert cooperative    who appearsr stated age in no acute distress.  HEENT: normocephalic atraumatic , Eyes: PERRL EOM's full, conjunctiva clear, Nares: paten,t no deformity discharge or tenderness., Ears: no deformity EAC's clear TMs with normal landmarks. NECK: supple without masses, thyromegaly or bruits. CHEST/PULM:  Clear to auscultation and percussion breath sounds equal no wheeze , rales or rhonchi. No chest wall deformities or tenderness. Breast: normal by inspection . No dimpling, discharge, masses, tenderness or discharge . CV: PMI is nondisplaced, S1 S2 no gallops, murmurs, rubs. Peripheral pulses are full without delay.No JVD .  ABDOMEN: Bowel sounds normal nontender  No guard or rebound, no hepato splenomegal no CVA tenderness.  Extremtities:  No clubbing cyanosis or edema, no acute joint swelling or redness no focal atrophy knees some enlargement no crepitus instability or gross effusion puffy lateral ankles but no edema NEURO:  Oriented x3, cranial nerves 3-12 appear to be intact, no obvious focal weakness,gait within normal limits no abnormal reflexes or asymmetrical SKIN: No acute rashes normal turgor, color, no bruising or petechiae. PSYCH: Oriented, good eye contact, no obvious depression anxiety, cognition and judgment appear normal. LN: no cervical axillary  adenopathy  Lab Results  Component Value Date   WBC 6.4 08/08/2021   HGB 12.1 08/08/2021   HCT 37.1 08/08/2021   PLT 268.0 08/08/2021   GLUCOSE 81 08/08/2021   CHOL 188 08/08/2021   TRIG 90.0 08/08/2021   HDL 67.00 08/08/2021   LDLDIRECT 122.4 04/25/2011   LDLCALC 103 (H) 08/08/2021   ALT 12 08/08/2021   AST 21 08/08/2021   NA 143 08/08/2021   K 3.7 08/08/2021   CL 107 08/08/2021   CREATININE 0.83 08/08/2021   BUN 8 08/08/2021   CO2 26 08/08/2021   TSH 2.52  08/08/2021   HGBA1C 5.7 08/08/2021    BP Readings from Last 3 Encounters:  08/08/21 120/76  08/16/20 128/72  08/03/20 118/80   Coffee  cream otherwise fasting Lab plan reviewed with patient   ASSESSMENT AND PLAN:  Discussed the following assessment and plan:    ICD-10-CM   1. Visit for preventive health examination  Z00.00     2. Elevated LDL cholesterol level  E78.00 Basic metabolic panel    CBC with Differential/Platelet    Hemoglobin A1c    Hepatic function panel    Lipid panel    TSH    TSH    Lipid panel    Hepatic function panel    Hemoglobin A1c    CBC with Differential/Platelet    Basic metabolic panel    3. Medication management  Z79.899 Basic metabolic panel    CBC with Differential/Platelet    Hemoglobin A1c    Hepatic function panel    Lipid panel    TSH    C-reactive protein    C-reactive protein    TSH    Lipid panel    Hepatic function panel    Hemoglobin A1c    CBC with Differential/Platelet    Basic metabolic panel    4. Sickle cell trait (HCC)  D57.3 Basic metabolic panel    CBC with Differential/Platelet    Hemoglobin A1c    Hepatic function panel    Lipid panel    TSH    TSH    Lipid panel    Hepatic function panel    Hemoglobin A1c    CBC with Differential/Platelet  Basic metabolic panel    5. Arthritis of knee  M17.10 Basic metabolic panel    CBC with Differential/Platelet    Hemoglobin A1c    Hepatic function panel    Lipid panel    TSH    Sedimentation rate    C-reactive protein    C-reactive protein    Sedimentation rate    TSH    Lipid panel    Hepatic function panel    Hemoglobin A1c    CBC with Differential/Platelet    Basic metabolic panel    Reviewed knee exercise strengthening prevention can use topical Voltaren to avoid NSAIDs if possible because of renal effects.  Otherwise try some of the stretching exercises if getting worse will reevaluate. Work on  healthy weight Appears to be up-to-date on other  parameters can get bone density next year last 24 November 2019 Return in about 1 year (around 08/09/2022) for depending on results, preventive /cpx and medications.  Patient Care Team: Antonette Hendricks, Neta Mends, MD as PCP - General Meryl Dare, MD as Consulting Physician (Gastroenterology) Mitchel Honour, DO as Consulting Physician (Obstetrics and Gynecology) Patient Instructions  Good to see  you today  Suspect knee pain is osteoarthritis. Try topical gel Voltaren  OTC ( or if generic ok ) to use about 4 grams  up to 4 x per day as needed for knee arthritic pan If bad Doran Clay can take 1-2 aleve  also . Update lab today . Fu depending  on results and how doing .  Can do bone density fu next year     Neta Mends. Zury Fazzino M.D.

## 2021-08-08 NOTE — Patient Instructions (Addendum)
Good to see  you today  Suspect knee pain is osteoarthritis. Try topical gel Voltaren  OTC ( or if generic ok ) to use about 4 grams  up to 4 x per day as needed for knee arthritic pan If bad Doran Clay can take 1-2 aleve  also . Update lab today . Fu depending  on results and how doing .  Can do bone density fu next year

## 2021-08-18 NOTE — Progress Notes (Signed)
Cholesterol much better  rest of labs   in range inflammation markers are not elevated. Continue lifestyle intervention healthy eating and exercise .

## 2021-08-22 ENCOUNTER — Telehealth: Payer: Self-pay | Admitting: Internal Medicine

## 2021-08-22 NOTE — Telephone Encounter (Signed)
Left message for patient to call back and schedule Medicare Annual Wellness Visit (AWV) either virtually or in office. Left  my jabber number [phone]   Last AWV  08/16/20 ; please schedule at anytime with [facility] Nurse Health Advisor 1 or 2   

## 2021-09-05 ENCOUNTER — Ambulatory Visit (INDEPENDENT_AMBULATORY_CARE_PROVIDER_SITE_OTHER): Payer: Medicare Other

## 2021-09-05 VITALS — Ht 63.0 in | Wt 180.0 lb

## 2021-09-05 DIAGNOSIS — Z0000 Encounter for general adult medical examination without abnormal findings: Secondary | ICD-10-CM

## 2021-09-05 NOTE — Patient Instructions (Addendum)
Cathy Meyers , Thank you for taking time to come for your Medicare Wellness Visit. I appreciate your ongoing commitment to your health goals. Please review the following plan we discussed and let me know if I can assist you in the future.   These are the goals we discussed:  Goals       Patient Stated      Keep monitoring her diet and eating well       Stay healthy (pt-stated)      Weight (lb) < 150 lb (68 kg) (pt-stated)      I would like to lose a 25 lbs       Weight (lb) < 165 lb (74.8 kg)      Portion control;  Try to keep sodium around 2500mg  per day   Check out  online nutrition programs as and WikiBlast.com.cy; fit38me; Look for foods with "whole" wheat; bran; oatmeal etc Shot at the farmer's markets in season for fresher choices  Watch for "hydrogenated" on the label of oils which are trans-fats.  Watch for "high fructose corn syrup" in snacks, yogurt or ketchup  Meats have less marbling; bright colored fruits and vegetables;  Canned; dump out liquid and wash vegetables. Be mindful of what we are eating  Portion control is essential to a health weight! Sit down; take a break and enjoy your meal; take smaller bites; put the fork down between bites;  It takes 20 minutes to get full; so check in with your fullness cues and stop eating when you start to fill full               This is a list of the screening recommended for you and due dates:  Health Maintenance  Topic Date Due   Flu Shot  08/23/2021   COVID-19 Vaccine (5 - Mixed Product risk series) 02/08/2022*   Tetanus Vaccine  09/06/2022*   Mammogram  10/13/2021   Colon Cancer Screening  09/15/2026   Pneumonia Vaccine  Completed   DEXA scan (bone density measurement)  Completed   Hepatitis C Screening: USPSTF Recommendation to screen - Ages 56-79 yo.  Completed   Zoster (Shingles) Vaccine  Completed   HPV Vaccine  Aged Out  *Topic was postponed. The date shown is not the original due date.    Advanced directives: Yes  Conditions/risks identified: None  Next appointment: Follow up in one year for your annual wellness visit    Preventive Care 65 Years and Older, Female Preventive care refers to lifestyle choices and visits with your health care provider that can promote health and wellness. What does preventive care include? A yearly physical exam. This is also called an annual well check. Dental exams once or twice a year. Routine eye exams. Ask your health care provider how often you should have your eyes checked. Personal lifestyle choices, including: Daily care of your teeth and gums. Regular physical activity. Eating a healthy diet. Avoiding tobacco and drug use. Limiting alcohol use. Practicing safe sex. Taking low-dose aspirin every day. Taking vitamin and mineral supplements as recommended by your health care provider. What happens during an annual well check? The services and screenings done by your health care provider during your annual well check will depend on your age, overall health, lifestyle risk factors, and family history of disease. Counseling  Your health care provider may ask you questions about your: Alcohol use. Tobacco use. Drug use. Emotional well-being. Home and relationship well-being. Sexual activity. Eating habits. History of  falls. Memory and ability to understand (cognition). Work and work Astronomer. Reproductive health. Screening  You may have the following tests or measurements: Height, weight, and BMI. Blood pressure. Lipid and cholesterol levels. These may be checked every 5 years, or more frequently if you are over 51 years old. Skin check. Lung cancer screening. You may have this screening every year starting at age 74 if you have a 30-pack-year history of smoking and currently smoke or have quit within the past 15 years. Fecal occult blood test (FOBT) of the stool. You may have this test every year starting at age  75. Flexible sigmoidoscopy or colonoscopy. You may have a sigmoidoscopy every 5 years or a colonoscopy every 10 years starting at age 77. Hepatitis C blood test. Hepatitis B blood test. Sexually transmitted disease (STD) testing. Diabetes screening. This is done by checking your blood sugar (glucose) after you have not eaten for a while (fasting). You may have this done every 1-3 years. Bone density scan. This is done to screen for osteoporosis. You may have this done starting at age 47. Mammogram. This may be done every 1-2 years. Talk to your health care provider about how often you should have regular mammograms. Talk with your health care provider about your test results, treatment options, and if necessary, the need for more tests. Vaccines  Your health care provider may recommend certain vaccines, such as: Influenza vaccine. This is recommended every year. Tetanus, diphtheria, and acellular pertussis (Tdap, Td) vaccine. You may need a Td booster every 10 years. Zoster vaccine. You may need this after age 64. Pneumococcal 13-valent conjugate (PCV13) vaccine. One dose is recommended after age 41. Pneumococcal polysaccharide (PPSV23) vaccine. One dose is recommended after age 14. Talk to your health care provider about which screenings and vaccines you need and how often you need them. This information is not intended to replace advice given to you by your health care provider. Make sure you discuss any questions you have with your health care provider. Document Released: 02/05/2015 Document Revised: 09/29/2015 Document Reviewed: 11/10/2014 Elsevier Interactive Patient Education  2017 [employer].  Fall Prevention in the Home Falls can cause injuries. They can happen to people of all ages. There are many things you can do to make your home safe and to help prevent falls. What can I do on the outside of my home? Regularly fix the edges of walkways and driveways and fix any cracks. Remove  anything that might make you trip as you walk through a door, such as a raised step or threshold. Trim any bushes or trees on the path to your home. Use bright outdoor lighting. Clear any walking paths of anything that might make someone trip, such as rocks or tools. Regularly check to see if handrails are loose or broken. Make sure that both sides of any steps have handrails. Any raised decks and porches should have guardrails on the edges. Have any leaves, snow, or ice cleared regularly. Use sand or salt on walking paths during winter. Clean up any spills in your garage right away. This includes oil or grease spills. What can I do in the bathroom? Use night lights. Install grab bars by the toilet and in the tub and shower. Do not use towel bars as grab bars. Use non-skid mats or decals in the tub or shower. If you need to sit down in the shower, use a plastic, non-slip stool. Keep the floor dry. Clean up any water that spills on the  floor as soon as it happens. Remove soap buildup in the tub or shower regularly. Attach bath mats securely with double-sided non-slip rug tape. Do not have throw rugs and other things on the floor that can make you trip. What can I do in the bedroom? Use night lights. Make sure that you have a light by your bed that is easy to reach. Do not use any sheets or blankets that are too big for your bed. They should not hang down onto the floor. Have a firm chair that has side arms. You can use this for support while you get dressed. Do not have throw rugs and other things on the floor that can make you trip. What can I do in the kitchen? Clean up any spills right away. Avoid walking on wet floors. Keep items that you use a lot in easy-to-reach places. If you need to reach something above you, use a strong step stool that has a grab bar. Keep electrical cords out of the way. Do not use floor polish or wax that makes floors slippery. If you must use wax, use  non-skid floor wax. Do not have throw rugs and other things on the floor that can make you trip. What can I do with my stairs? Do not leave any items on the stairs. Make sure that there are handrails on both sides of the stairs and use them. Fix handrails that are broken or loose. Make sure that handrails are as long as the stairways. Check any carpeting to make sure that it is firmly attached to the stairs. Fix any carpet that is loose or worn. Avoid having throw rugs at the top or bottom of the stairs. If you do have throw rugs, attach them to the floor with carpet tape. Make sure that you have a light switch at the top of the stairs and the bottom of the stairs. If you do not have them, ask someone to add them for you. What else can I do to help prevent falls? Wear shoes that: Do not have high heels. Have rubber bottoms. Are comfortable and fit you well. Are closed at the toe. Do not wear sandals. If you use a stepladder: Make sure that it is fully opened. Do not climb a closed stepladder. Make sure that both sides of the stepladder are locked into place. Ask someone to hold it for you, if possible. Clearly mark and make sure that you can see: Any grab bars or handrails. First and last steps. Where the edge of each step is. Use tools that help you move around (mobility aids) if they are needed. These include: Canes. Walkers. Scooters. Crutches. Turn on the lights when you go into a dark area. Replace any light bulbs as soon as they burn out. Set up your furniture so you have a clear path. Avoid moving your furniture around. If any of your floors are uneven, fix them. If there are any pets around you, be aware of where they are. Review your medicines with your doctor. Some medicines can make you feel dizzy. This can increase your chance of falling. Ask your doctor what other things that you can do to help prevent falls. This information is not intended to replace advice given to  you by your health care provider. Make sure you discuss any questions you have with your health care provider. Document Released: 11/05/2008 Document Revised: 06/17/2015 Document Reviewed: 02/13/2014 Elsevier Interactive Patient Education  2017 [employer].

## 2021-09-05 NOTE — Progress Notes (Signed)
Subjective:   Cathy Meyers is a 76 y.o. female who presents for Medicare Annual (Subsequent) preventive examination.  Review of Systems    Virtual Visit via Telephone Note  I connected with  Cathy Meyers on 09/05/21 at 10:00 AM EDT by telephone and verified that I am speaking with the correct person using two identifiers.  Location: Patient: Home Provider: Office Persons participating in the virtual visit: patient/Nurse Health Advisor   I discussed the limitations, risks, security and privacy concerns of performing an evaluation and management service by telephone and the availability of in person appointments. The patient expressed understanding and agreed to proceed.  Interactive audio and video telecommunications were attempted between this nurse and patient, however failed, due to patient having technical difficulties OR patient did not have access to video capability.  We continued and completed visit with audio only.  Some vital signs may be absent or patient reported.   Tillie Rung, LPN  Cardiac Risk Factors include: advanced age (>27men, >48 women)     Objective:    Today's Vitals   09/05/21 0950  Weight: 180 lb (81.6 kg)  Height: 5\' 3"  (1.6 m)   Body mass index is 31.89 kg/m.     09/05/2021    9:58 AM 08/16/2020    1:10 PM 08/22/2019    2:53 PM 08/08/2017    9:36 AM 02/28/2017   10:02 AM  Advanced Directives  Does Patient Have a Medical Advance Directive? Yes Yes Yes Yes No  Type of 04/28/2017 of [place];Living will Healthcare Power of [place];Living will Healthcare Power of [place];Living will    Does patient want to make changes to medical advance directive? No - Patient declined  No - Patient declined    Copy of Healthcare Power of Attorney in Chart? No - copy requested No - copy requested No - copy requested    Would patient like information on creating a medical advance directive?     Yes  (MAU/Ambulatory/Procedural Areas - Information given)    Current Medications (verified) Outpatient Encounter Medications as of 09/05/2021  Medication Sig   Ascorbic Acid (VITAMIN C) 1000 MG tablet Vitamin C   Cholecalciferol (VITAMIN D) 50 MCG (2000 UT) CAPS Vitamin D   fluticasone (FLONASE) 50 MCG/ACT nasal spray SPRAY 2 SPRAYS INTO EACH NOSTRIL EVERY DAY   ibuprofen (ADVIL,MOTRIN) 600 MG tablet Take 1 tablet (600 mg total) by mouth every 6 (six) hours as needed.   Multiple Vitamin (MULTIVITAMIN) tablet Take 1 tablet by mouth daily.     naproxen sodium (ALEVE) 220 MG tablet Take 440 mg by mouth daily as needed (for headache).   No facility-administered encounter medications on file as of 09/05/2021.    Allergies (verified) Penicillins   History: Past Medical History:  Diagnosis Date   Allergy    Anemia    Chronic kidney disease    Gastritis    EGD neg bx 2011    Headache    otc med prn   Hiatal hernia    small on egd   History of varicella    Hx: UTI (urinary tract infection)    pyelo   Hyperlipidemia    diet controlled    Pyelonephritis 06/04/2010   Shingles 11/24/2011   Classic typical symptoms expectant management and treatment begin antiviral. Discussed pain control options    Sickle cell trait (HCC)    SVD (spontaneous vaginal delivery)    x 3   Past Surgical History:  Procedure  Laterality Date   COLONOSCOPY     DILATION AND CURETTAGE OF UTERUS  06/2016   HYSTEROSCOPY WITH D & C N/A 03/02/2017   Procedure: DILATATION AND CURETTAGE /HYSTEROSCOPY;  Surgeon: Mitchel Honour, DO;  Location: [facility];  Service: Gynecology;  Laterality: N/A;   UPPER GASTROINTESTINAL ENDOSCOPY     WISDOM TOOTH EXTRACTION     Family History  Adopted: Yes  Problem Relation Age of Onset   Hypertension Mother        family hx   Heart failure Mother    Colon cancer Neg Hx    Colon polyps Neg Hx    Esophageal cancer Neg Hx    Rectal cancer Neg Hx    Stomach cancer Neg Hx    Social  History   Socioeconomic History   Marital status: Married    Spouse name: Not on file   Number of children: Not on file   Years of education: Not on file   Highest education level: Not on file  Occupational History   Not on file  Tobacco Use   Smoking status: Former    Packs/day: 0.10    Types: Cigarettes    Quit date: 13    Years since quitting: 25.6   Smokeless tobacco: Never   Tobacco comments:    smoked in school periodically   Vaping Use   Vaping Use: Never used  Substance and Sexual Activity   Alcohol use: Yes    Comment: socially   Drug use: No   Sexual activity: Not Currently    Birth control/protection: Post-menopausal  Other Topics Concern   Not on file  Social History Narrative   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   Hx of divorce    No pets    Raised by family member not mom   [place]-   G3P3   HHof 2  Husband has DM   - 7 hours of sleep    Social Determinants of Health   Financial Resource Strain: Low Risk  (09/05/2021)   Overall Financial Resource Strain (CARDIA)    Difficulty of Paying Living Expenses: Not hard at all  Food Insecurity: No Food Insecurity (09/05/2021)   Hunger Vital Sign    Worried About Running Out of Food in the Last Year: Never true    Ran Out of Food in the Last Year: Never true  Transportation Needs: No Transportation Needs (09/05/2021)   PRAPARE - Administrator, Civil Service (Medical): No    Lack of Transportation (Non-Medical): No  Physical Activity: Sufficiently Active (09/05/2021)   Exercise Vital Sign    Days of Exercise per Week: 2 days    Minutes of Exercise per Session: 90 min  Stress: No Stress Concern Present (09/05/2021)   [employer] of Occupational Health - Occupational Stress Questionnaire    Feeling of Stress : Not at all  Social Connections: Socially Integrated (09/05/2021)   Social Connection and Isolation Panel [NHANES]    Frequency of Communication with  Friends and Family: More than three times a week    Frequency of Social Gatherings with Friends and Family: More than three times a week    Attends Religious Services: More than 4 times per year    Active Member of [employer] or Organizations: Yes    Attends Engineer, structural: More than 4 times per year    Marital Status: Married    Tobacco Counseling Counseling given: Not Answered  Tobacco comments: smoked in school periodically    Clinical Intake:  Pre-visit preparation completed: No        BMI - recorded: 33.06 Nutritional Status: BMI > 30  Obese Nutritional Risks: None Diabetes: No  How often do you need to have someone help you when you read instructions, pamphlets, or other written materials from your doctor or pharmacy?: 1 - Never  Diabetic?  No  Interpreter Needed?: No  Information entered by :: Theresa MulliganBeverly Viridiana Spaid LPN   Activities of Daily Living    09/05/2021    9:56 AM  In your present state of health, do you have any difficulty performing the following activities:  Hearing? 0  Vision? 0  Difficulty concentrating or making decisions? 0  Walking or climbing stairs? 0  Dressing or bathing? 0  Doing errands, shopping? 0  Preparing Food and eating ? N  Using the Toilet? N  In the past six months, have you accidently leaked urine? N  Do you have problems with loss of bowel control? N  Managing your Medications? N  Managing your Finances? N  Housekeeping or managing your Housekeeping? N    Patient Care Team: Panosh, Neta MendsWanda K, MD as PCP - General Meryl DareStark, Malcolm T, MD as Consulting Physician (Gastroenterology) Mitchel HonourMorris, Megan, DO as Consulting Physician (Obstetrics and Gynecology)  Indicate any recent Medical Services you may have received from other than [facility] providers in the past year (date may be approximate).     Assessment:   This is a routine wellness examination for Cathy Meyers.  Hearing/Vision screen Hearing Screening - Comments:: No hearing  difficulty Vision Screening - Comments:: Wears glasses. Follow[facility] by [facility]  Dietary issues and exercise activities discussed: Exercise limited by: None identified   Goals Addressed               This Visit's Progress     Stay healthy (pt-stated)         Depression Screen    09/05/2021    9:54 AM 08/08/2021   11:05 AM 08/16/2020    1:11 PM 08/03/2020   11:22 AM 08/22/2019    3:03 PM 07/25/2019   10:01 AM 08/08/2017    9:40 AM  PHQ 2/9 Scores  PHQ - 2 Score 0 0 0 0 0 0 0  PHQ- 9 Score 0 2  2 0 0     Fall Risk    09/05/2021    9:57 AM 08/08/2021   11:05 AM 08/16/2020    1:11 PM 08/22/2019    3:00 PM 07/25/2019   10:02 AM  Fall Risk   Falls in the past year? 0 0 0 0 0  Number falls in past yr: 0 0 0 0   Injury with Fall? 0 0 0 0   Risk for fall due to : No Fall Risks No Fall Risks     Risk for fall due to: Comment    occassional vertigo   Follow up  Falls prevention discussed Falls evaluation completed Falls evaluation completed;Falls prevention discussed     FALL RISK PREVENTION PERTAINING TO THE HOME:  Any stairs in or around the home? No  If so, are there any without handrails? No  Home free of loose throw rugs in walkways, pet beds, electrical cords, etc? Yes  Adequate lighting in your home to reduce risk of falls? Yes   ASSISTIVE DEVICES UTILIZED TO PREVENT FALLS:  Life alert? No  Use of a cane, walker or w/c? No  Grab bars  in the bathroom? Yes  Shower chair or bench in shower? No  Elevated toilet seat or a handicapped toilet? No   TIMED UP AND GO:  Was the test performed? No . Audio Visit   Cognitive Function:        09/05/2021    9:59 AM  6CIT Screen  What Year? 0 points  What month? 0 points  What time? 0 points  Count back from 20 0 points  Months in reverse 0 points  Repeat phrase 0 points  Total Score 0 points    Immunizations Immunization History  Administered Date(s) Administered   Influenza Split 11/21/2011, 10/24/2013    Influenza, High Dose Seasonal PF 09/24/2015, 10/05/2016, 11/08/2020   Influenza,inj,Quad PF,6+ Mos 10/25/2012, 10/09/2014   PFIZER(Purple Top)SARS-COV-2 Vaccination 02/10/2019, 02/28/2019, 08/09/2020   Pneumococcal Conjugate-13 10/08/2013   Pneumococcal Polysaccharide-23 08/14/2012   Td 01/24/2004   Zoster Recombinat (Shingrix) 05/16/2017, 08/07/2018, 12/12/2018   Zoster, Live 05/02/2011    TDAP status: Due, Education has been provided regarding the importance of this vaccine. Advised may receive this vaccine at local pharmacy or Health Dept. Aware to provide a copy of the vaccination record if obtained from local pharmacy or Health Dept. Verbalized acceptance and understanding.  Flu Vaccine status: Completed at today's visit  Pneumococcal vaccine status: Up to date  Covid-19 vaccine status: Completed vaccines  Qualifies for Shingles Vaccine? Yes   Zostavax completed Yes   Shingrix Completed?: Yes  Screening Tests Health Maintenance  Topic Date Due   INFLUENZA VACCINE  08/23/2021   COVID-19 Vaccine (5 - Mixed Product risk series) 02/08/2022 (Originally 10/04/2020)   TETANUS/TDAP  09/06/2022 (Originally 01/23/2014)   MAMMOGRAM  10/13/2021   COLONOSCOPY (Pts 45-37yrs Insurance coverage will need to be confirmed)  09/15/2026   Pneumonia Vaccine 48+ Years old  Completed   DEXA SCAN  Completed   Hepatitis C Screening  Completed   Zoster Vaccines- Shingrix  Completed   HPV VACCINES  Aged Out    Health Maintenance  Health Maintenance Due  Topic Date Due   INFLUENZA VACCINE  08/23/2021    Colorectal cancer screening: No longer required.   Mammogram status: No longer required due to Age.  Bone Density status: Completed 12/05/19. Results reflect: Bone density results: OSTEOPOROSIS. Repeat every   years.  Lung Cancer Screening: (Low Dose CT Chest recommended if Age 29-80 years, 30 pack-year currently smoking OR have quit w/in 15years.) does not qualify.    Additional  Screening:  Hepatitis C Screening: does qualify; Completed 02/25/16  Vision Screening: Recommended annual ophthalmology exams for early detection of glaucoma and other disorders of the eye. Is the patient up to date with their annual eye exam?  Yes  Who is the provider or what is the name of the office in which the patient attends annual eye exams? [facility] If pt is not established with a provider, would they like to be referred to a provider to establish care? No .   Dental Screening: Recommended annual dental exams for proper oral hygiene  Community Resource Referral / Chronic Care Management:  CRR required this visit?  No   CCM required this visit?  No      Plan:     I have personally reviewed and noted the following in the patient's chart:   Medical and social history Use of alcohol, tobacco or illicit drugs  Current medications and supplements including opioid prescriptions.  Functional ability and status Nutritional status Physical activity Advanced directives List of  other physicians Hospitalizations, surgeries, and ER visits in previous 12 months Vitals Screenings to include cognitive, depression, and falls Referrals and appointments  In addition, I have reviewed and discussed with patient certain preventive protocols, quality metrics, and best practice recommendations. A written personalized care plan for preventive services as well as general preventive health recommendations were provided to patient.     Tillie Rung, LPN   09/16/537   Nurse Notes: None

## 2021-09-07 ENCOUNTER — Other Ambulatory Visit: Payer: Self-pay | Admitting: Internal Medicine

## 2021-09-07 DIAGNOSIS — Z1231 Encounter for screening mammogram for malignant neoplasm of breast: Secondary | ICD-10-CM

## 2021-10-18 ENCOUNTER — Ambulatory Visit: Payer: Medicare Other

## 2021-11-18 ENCOUNTER — Ambulatory Visit
Admission: RE | Admit: 2021-11-18 | Discharge: 2021-11-18 | Disposition: A | Discharge status: Home / Self Care | Payer: Medicare Other | Source: Ambulatory Visit | Attending: Internal Medicine | Admitting: Internal Medicine

## 2021-11-18 DIAGNOSIS — Z1231 Encounter for screening mammogram for malignant neoplasm of breast: Secondary | ICD-10-CM

## 2021-12-06 DIAGNOSIS — H40013 Open angle with borderline findings, low risk, bilateral: Secondary | ICD-10-CM | POA: Diagnosis not present

## 2022-08-15 NOTE — Progress Notes (Addendum)
Chief Complaint  Patient presents with   Annual Exam    HPI: Patient  Cathy Meyers  77 y.o. comes in today for Preventive Health Care visit  Knee  pain used topical  2-3 x per week and helps her  then ocassional penetrex  Just had gyne check  Weight gain since jan feels well otherwise doesn't see a lot of change in health. Goes out to eat with friends ? How often sleep 5-6.5 hours Health Maintenance  Topic Date Due   DTaP/Tdap/Td (2 - Tdap) 01/23/2014   Medicare Annual Wellness (AWV)  09/06/2022   COVID-19 Vaccine (5 - 2023-24 season) 09/01/2022 (Originally 09/23/2021)   INFLUENZA VACCINE  08/24/2022   MAMMOGRAM  11/19/2022   Pneumonia Vaccine 18+ Years old  Completed   DEXA SCAN  Completed   Hepatitis C Screening  Completed   Zoster Vaccines- Shingrix  Completed   HPV VACCINES  Aged Out   Colonoscopy  Discontinued   Health Maintenance Review LIFESTYLE:  Exercise:  [facility] 2 x per week and tai chi   and arthritis bar  Tobacco/ETS: no Alcohol:  once in a while going out . Sugar beverages: ocass diet  soda  greentea Sleep: 6-8   Drug use: no HH of  2  no pets  Is adopted   biol mom passsed in 90s  developed alzheimer in lat 80s     ROS:  GEN/ HEENT: No fever, significant weight changes sweats headaches vision problems hearing changes, CV/ PULM; No chest pain shortness of breath cough, syncope,edema  change in exercise tolerance. GI /GU: No adominal pain, vomiting, change in bowel habits. No blood in the stool. No significant GU symptoms. SKIN/HEME: ,no acute skin rashes suspicious lesions or bleeding. No lymphadenopathy, nodules, masses.  NEURO/ PSYCH:  No neurologic signs such as weakness numbness. No depression anxiety. IMM/ Allergy: No unusual infections.  Allergy .   REST of 12 system review negative except as per HPI   Past Medical History:  Diagnosis Date   Allergy    Anemia    Chronic kidney disease    Gastritis    EGD neg bx 2011     Headache    otc med prn   Hiatal hernia    small on egd   History of varicella    Hx: UTI (urinary tract infection)    pyelo   Hyperlipidemia    diet controlled    Pyelonephritis 06/04/2010   Shingles 11/24/2011   Classic typical symptoms expectant management and treatment begin antiviral. Discussed pain control options    Sickle cell trait (HCC)    SVD (spontaneous vaginal delivery)    x 3    Past Surgical History:  Procedure Laterality Date   COLONOSCOPY     DILATION AND CURETTAGE OF UTERUS  06/2016   HYSTEROSCOPY WITH D & C N/A 03/02/2017   Procedure: DILATATION AND CURETTAGE /HYSTEROSCOPY;  Surgeon: Mitchel Honour, DO;  Location: [facility];  Service: Gynecology;  Laterality: N/A;   UPPER GASTROINTESTINAL ENDOSCOPY     WISDOM TOOTH EXTRACTION      Family History  Adopted: Yes  Problem Relation Age of Onset   Hypertension Mother        family hx   Heart failure Mother    Colon cancer Neg Hx    Colon polyps Neg Hx    Esophageal cancer Neg Hx    Rectal cancer Neg Hx    Stomach cancer Neg Hx  Social History   Socioeconomic History   Marital status: Married    Spouse name: Not on file   Number of children: Not on file   Years of education: Not on file   Highest education level: Not on file  Occupational History   Not on file  Tobacco Use   Smoking status: Former    Current packs/day: 0.00    Types: Cigarettes    Quit date: 87    Years since quitting: 26.5   Smokeless tobacco: Never   Tobacco comments:    smoked in school periodically   Vaping Use   Vaping status: Never Used  Substance and Sexual Activity   Alcohol use: Yes    Comment: socially   Drug use: No   Sexual activity: Not Currently    Birth control/protection: Post-menopausal  Other Topics Concern   Not on file  Social History Narrative   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   Hx of divorce    No pets    Raised by family member not mom   [place]   G3P3    HHof 2  Husband has DM   - 7 hours of sleep    Social Determinants of Health   Financial Resource Strain: Low Risk  (09/05/2021)   Overall Financial Resource Strain (CARDIA)    Difficulty of Paying Living Expenses: Not hard at all  Food Insecurity: No Food Insecurity (09/05/2021)   Hunger Vital Sign    Worried About Running Out of Food in the Last Year: Never true    Ran Out of Food in the Last Year: Never true  Transportation Needs: No Transportation Needs (09/05/2021)   PRAPARE - Administrator, Civil Service (Medical): No    Lack of Transportation (Non-Medical): No  Physical Activity: Insufficiently Active (08/16/2022)   Exercise Vital Sign    Days of Exercise per Week: 2 days    Minutes of Exercise per Session: 60 min  Stress: No Stress Concern Present (08/16/2022)   [employer] of Occupational Health - Occupational Stress Questionnaire    Feeling of Stress : Not at all  Social Connections: Socially Integrated (08/16/2022)   Social Connection and Isolation Panel [NHANES]    Frequency of Communication with Friends and Family: More than three times a week    Frequency of Social Gatherings with Friends and Family: More than three times a week    Attends Religious Services: More than 4 times per year    Active Member of [employer] or Organizations: Yes    Attends Engineer, structural: More than 4 times per year    Marital Status: Married    Outpatient Medications Prior to Visit  Medication Sig Dispense Refill   Ascorbic Acid (VITAMIN C) 1000 MG tablet Vitamin C     Aspirin-Acetaminophen-Caffeine (EXCEDRIN PO) Take by mouth as needed. 500mg      Cholecalciferol (VITAMIN D) 50 MCG (2000 UT) CAPS Vitamin D     fluticasone (FLONASE) 50 MCG/ACT nasal spray SPRAY 2 SPRAYS INTO EACH NOSTRIL EVERY DAY 1 g 3   ibuprofen (ADVIL,MOTRIN) 600 MG tablet Take 1 tablet (600 mg total) by mouth every 6 (six) hours as needed. 30 tablet 0   Multiple Vitamin (MULTIVITAMIN) tablet  Take 1 tablet by mouth daily.       naproxen sodium (ALEVE) 220 MG tablet Take 440 mg by mouth daily as needed (for headache).     OVER THE COUNTER MEDICATION  PeneTrex. Rubbing ointment for arthritis pain.     No facility-administered medications prior to visit.     EXAM:  BP 128/80 (BP Location: Left Arm, Patient Position: Sitting, Cuff Size: Large)   Pulse 74   Temp 98.1 F (36.7 C) (Oral)   Ht 5\' 3"  (1.6 m)   Wt 187 lb 12.8 oz (85.2 kg)   SpO2 98%   BMI 33.27 kg/m   Body mass index is 33.27 kg/m. Wt Readings from Last 3 Encounters:  08/16/22 187 lb 12.8 oz (85.2 kg)  09/05/21 180 lb (81.6 kg)  08/08/21 186 lb 9.6 oz (84.6 kg)    Physical Exam: Vital signs reviewed EXH:BZJI is a well-developed well-nourished alert cooperative    who appearsr stated age in no acute distress.  HEENT: normocephalic atraumatic , Eyes: PERRL EOM's full, conjunctiva clear, Nares: paten,t no deformity discharge or tenderness., Ears: no deformity EAC's clear TMs with normal landmarks. Mouth: clear OP, no lesions, edema.  Moist mucous membranes. Dentition in adequate repair. NECK: supple without masses, thyromegaly or bruits. CHEST/PULM:  Clear to auscultation and percussion breath sounds equal no wheeze , rales or rhonchi. No chest wall deformities or tenderness. Breast: normal by inspection . No dimpling, discharge, masses, tenderness or discharge . CV: PMI is nondisplaced, S1 S2 no gallops, murmurs, rubs. Peripheral pulses are full without delay.No JVD .  ABDOMEN: Bowel sounds normal nontender  No guard or rebound, no hepato splenomegal no CVA tenderness.  No hernia. Extremtities:  No clubbing cyanosis or edema, no acute joint swelling or redness no focal atrophy NEURO:  Oriented x3, cranial nerves 3-12 appear to be intact, no obvious focal weakness,gait within normal limits no abnormal reflexes or asymmetrical SKIN: No acute rashes normal turgor, color, no bruising or petechiae. PSYCH: Oriented,  good eye contact, no obvious depression anxiety, cognition and judgment appear normal. LN: no cervical axillary inguinal adenopathy  Lab Results  Component Value Date   WBC 6.4 08/08/2021   HGB 12.1 08/08/2021   HCT 37.1 08/08/2021   PLT 268.0 08/08/2021   GLUCOSE 81 08/08/2021   CHOL 188 08/08/2021   TRIG 90.0 08/08/2021   HDL 67.00 08/08/2021   LDLDIRECT 122.4 04/25/2011   LDLCALC 103 (H) 08/08/2021   ALT 12 08/08/2021   AST 21 08/08/2021   NA 143 08/08/2021   K 3.7 08/08/2021   CL 107 08/08/2021   CREATININE 0.83 08/08/2021   BUN 8 08/08/2021   CO2 26 08/08/2021   TSH 2.52 08/08/2021   HGBA1C 5.7 08/08/2021    BP Readings from Last 3 Encounters:  08/16/22 128/80  08/08/21 120/76  08/16/20 128/72    Lab planreviewed with patient   ASSESSMENT AND PLAN:  Discussed the following assessment and plan:    ICD-10-CM   1. Routine general medical examination at a health care facility  Z00.00     2. SICKLE CELL TRAIT  D57.3 Basic metabolic panel    Hemoglobin A1c    Lipid panel    TSH    T4, free    Hepatic function panel    CBC with Differential/Platelet    3. Medication management  Z79.899 Basic metabolic panel    Hemoglobin A1c    Lipid panel    TSH    T4, free    Hepatic function panel    CBC with Differential/Platelet    4. Elevated LDL cholesterol level  E78.00 Basic metabolic panel    Hemoglobin A1c    Lipid panel  TSH    T4, free    Hepatic function panel    CBC with Differential/Platelet    5. Weight gain  R63.5 Basic metabolic panel    Hemoglobin A1c    Lipid panel    TSH    T4, free    Hepatic function panel    CBC with Differential/Platelet   17# since Jan24? prob multifactorial  disc adding rsistance training and lab today tsh lipid chem    6. Pain in both knees, unspecified chronicity  M25.561 Basic metabolic panel   Z61.096 Hemoglobin A1c    Lipid panel    TSH    T4, free    Hepatic function panel    CBC with  Differential/Platelet   intermittnet controlled by topicals as needed    7. Constipation, unspecified constipation type  K59.00 Basic metabolic panel    Hemoglobin A1c    Lipid panel    TSH    T4, free    Hepatic function panel    CBC with Differential/Platelet   chronic   managed with fiber supp  and diet    Most likely multifactorial   Optimize sleep disc and adding resistance training  R/o metabolic effects causes   not fasting today had muscardine grape juice .  Return for depending on results and yearly .  Patient Care Team: Drako Maese, Neta Mends, MD as PCP - General Meryl Dare, MD as Consulting Physician (Gastroenterology) Mitchel Honour, DO as Consulting Physician (Obstetrics and Gynecology) Patient Instructions  Good to see y ou today . Weight gain  probably multifactorial. Optimize sleep.  Add resistance training . Avoid calories in beverages .  Track intake and sleep  for 2-3 weeks and make changes as appropriate.     Neta Mends. Cathy Meyers M.D.

## 2022-08-16 ENCOUNTER — Encounter: Payer: Self-pay | Admitting: Internal Medicine

## 2022-08-16 ENCOUNTER — Ambulatory Visit (INDEPENDENT_AMBULATORY_CARE_PROVIDER_SITE_OTHER): Payer: Medicare Other | Admitting: Internal Medicine

## 2022-08-16 VITALS — BP 128/80 | HR 74 | Temp 98.1°F | Ht 63.0 in | Wt 187.8 lb

## 2022-08-16 DIAGNOSIS — Z79899 Other long term (current) drug therapy: Secondary | ICD-10-CM | POA: Diagnosis not present

## 2022-08-16 DIAGNOSIS — K5900 Constipation, unspecified: Secondary | ICD-10-CM

## 2022-08-16 DIAGNOSIS — M25562 Pain in left knee: Secondary | ICD-10-CM | POA: Diagnosis not present

## 2022-08-16 DIAGNOSIS — Z0000 Encounter for general adult medical examination without abnormal findings: Secondary | ICD-10-CM

## 2022-08-16 DIAGNOSIS — D573 Sickle-cell trait: Secondary | ICD-10-CM | POA: Diagnosis not present

## 2022-08-16 DIAGNOSIS — M25561 Pain in right knee: Secondary | ICD-10-CM

## 2022-08-16 DIAGNOSIS — E7800 Pure hypercholesterolemia, unspecified: Secondary | ICD-10-CM

## 2022-08-16 DIAGNOSIS — R635 Abnormal weight gain: Secondary | ICD-10-CM

## 2022-08-16 LAB — CBC WITH DIFFERENTIAL/PLATELET
Basophils Absolute: 0 10*3/uL (ref 0.0–0.1)
Basophils Relative: 0.4 % (ref 0.0–3.0)
Eosinophils Absolute: 0.2 10*3/uL (ref 0.0–0.7)
Eosinophils Relative: 2.7 % (ref 0.0–5.0)
HCT: 37.7 % (ref 36.0–46.0)
Hemoglobin: 12.2 g/dL (ref 12.0–15.0)
Lymphocytes Relative: 28.9 % (ref 12.0–46.0)
Lymphs Abs: 2.1 10*3/uL (ref 0.7–4.0)
MCHC: 32.5 g/dL (ref 30.0–36.0)
MCV: 90.3 fl (ref 78.0–100.0)
Monocytes Absolute: 0.6 10*3/uL (ref 0.1–1.0)
Monocytes Relative: 8.8 % (ref 3.0–12.0)
Neutro Abs: 4.3 10*3/uL (ref 1.4–7.7)
Neutrophils Relative %: 59.2 % (ref 43.0–77.0)
Platelets: 297 10*3/uL (ref 150.0–400.0)
RBC: 4.17 Mil/uL (ref 3.87–5.11)
RDW: 14.4 % (ref 11.5–15.5)
WBC: 7.2 10*3/uL (ref 4.0–10.5)

## 2022-08-16 LAB — HEPATIC FUNCTION PANEL
ALT: 13 U/L (ref 0–35)
AST: 19 U/L (ref 0–37)
Albumin: 4.1 g/dL (ref 3.5–5.2)
Alkaline Phosphatase: 114 U/L (ref 39–117)
Bilirubin, Direct: 0.2 mg/dL (ref 0.0–0.3)
Total Bilirubin: 0.9 mg/dL (ref 0.2–1.2)
Total Protein: 7.5 g/dL (ref 6.0–8.3)

## 2022-08-16 LAB — BASIC METABOLIC PANEL
BUN: 7 mg/dL (ref 6–23)
CO2: 26 mEq/L (ref 19–32)
Calcium: 9.8 mg/dL (ref 8.4–10.5)
Chloride: 107 mEq/L (ref 96–112)
Creatinine, Ser: 0.79 mg/dL (ref 0.40–1.20)
GFR: 72.84 mL/min (ref >60.00)
Glucose, Bld: 80 mg/dL (ref 70–99)
Potassium: 3.7 mEq/L (ref 3.5–5.1)
Sodium: 142 mEq/L (ref 135–145)

## 2022-08-16 LAB — LIPID PANEL
Cholesterol: 204 mg/dL — ABNORMAL HIGH (ref 0–200)
HDL: 66.3 mg/dL (ref >39.00)
LDL Cholesterol: 115 mg/dL — ABNORMAL HIGH (ref 0–99)
NonHDL: 137.41
Total CHOL/HDL Ratio: 3
Triglycerides: 113 mg/dL (ref 0.0–149.0)
VLDL: 22.6 mg/dL (ref 0.0–40.0)

## 2022-08-16 LAB — HEMOGLOBIN A1C: Hgb A1c MFr Bld: 5.5 % (ref 4.6–6.5)

## 2022-08-16 LAB — T4, FREE: Free T4: 0.95 ng/dL (ref 0.60–1.60)

## 2022-08-16 LAB — TSH: TSH: 2.71 u[IU]/mL (ref 0.35–5.50)

## 2022-08-16 NOTE — Patient Instructions (Signed)
Good to see y ou today . Weight gain  probably multifactorial. Optimize sleep.  Add resistance training . Avoid calories in beverages .  Track intake and sleep  for 2-3 weeks and make changes as appropriate.

## 2022-08-21 NOTE — Progress Notes (Signed)
Cholesterol slightly up but still favorable ratio thyroid nl and no diabetes  Intensify lattention  to ifestyle interventions.

## 2022-09-11 ENCOUNTER — Ambulatory Visit (INDEPENDENT_AMBULATORY_CARE_PROVIDER_SITE_OTHER): Payer: Medicare Other

## 2022-09-11 VITALS — Ht 63.0 in | Wt 184.0 lb

## 2022-09-11 DIAGNOSIS — Z0000 Encounter for general adult medical examination without abnormal findings: Secondary | ICD-10-CM | POA: Diagnosis not present

## 2022-09-11 NOTE — Patient Instructions (Addendum)
Cathy Meyers , Thank you for taking time to come for your Medicare Wellness Visit. I appreciate your ongoing commitment to your health goals. Please review the following plan we discussed and let me know if I can assist you in the future.   Referrals/Orders/Follow-Ups/Clinician Recommendations:   This is a list of the screening recommended for you and due dates:  Health Maintenance  Topic Date Due   DTaP/Tdap/Td vaccine (2 - Tdap) 01/23/2014   COVID-19 Vaccine (5 - 2023-24 season) 09/23/2021   Flu Shot  08/24/2022   Mammogram  11/19/2022   Medicare Annual Wellness Visit  09/11/2023   Pneumonia Vaccine  Completed   DEXA scan (bone density measurement)  Completed   Hepatitis C Screening  Completed   Zoster (Shingles) Vaccine  Completed   HPV Vaccine  Aged Out   Colon Cancer Screening  Discontinued    Advanced directives: (Declined) Advance directive discussed with you today. Even though you declined this today, please call our office should you change your mind, and we can give you the proper paperwork for you to fill out.  Next Medicare Annual Wellness Visit scheduled for next year: Yes  Preventive Care 43 Years and Older, Female Preventive care refers to lifestyle choices and visits with your health care provider that can promote health and wellness. What does preventive care include? A yearly physical exam. This is also called an annual well check. Dental exams once or twice a year. Routine eye exams. Ask your health care provider how often you should have your eyes checked. Personal lifestyle choices, including: Daily care of your teeth and gums. Regular physical activity. Eating a healthy diet. Avoiding tobacco and drug use. Limiting alcohol use. Practicing safe sex. Taking low-dose aspirin every day. Taking vitamin and mineral supplements as recommended by your health care provider. What happens during an annual well check? The services and screenings done by your health  care provider during your annual well check will depend on your age, overall health, lifestyle risk factors, and family history of disease. Counseling  Your health care provider may ask you questions about your: Alcohol use. Tobacco use. Drug use. Emotional well-being. Home and relationship well-being. Sexual activity. Eating habits. History of falls. Memory and ability to understand (cognition). Work and work Astronomer. Reproductive health. Screening  You may have the following tests or measurements: Height, weight, and BMI. Blood pressure. Lipid and cholesterol levels. These may be checked every 5 years, or more frequently if you are over 96 years old. Skin check. Lung cancer screening. You may have this screening every year starting at age 50 if you have a 30-pack-year history of smoking and currently smoke or have quit within the past 15 years. Fecal occult blood test (FOBT) of the stool. You may have this test every year starting at age 16. Flexible sigmoidoscopy or colonoscopy. You may have a sigmoidoscopy every 5 years or a colonoscopy every 10 years starting at age 34. Hepatitis C blood test. Hepatitis B blood test. Sexually transmitted disease (STD) testing. Diabetes screening. This is done by checking your blood sugar (glucose) after you have not eaten for a while (fasting). You may have this done every 1-3 years. Bone density scan. This is done to screen for osteoporosis. You may have this done starting at age 50. Mammogram. This may be done every 1-2 years. Talk to your health care provider about how often you should have regular mammograms. Talk with your health care provider about your test results, treatment options,  and if necessary, the need for more tests. Vaccines  Your health care provider may recommend certain vaccines, such as: Influenza vaccine. This is recommended every year. Tetanus, diphtheria, and acellular pertussis (Tdap, Td) vaccine. You may need a Td  booster every 10 years. Zoster vaccine. You may need this after age 77. Pneumococcal 13-valent conjugate (PCV13) vaccine. One dose is recommended after age 61. Pneumococcal polysaccharide (PPSV23) vaccine. One dose is recommended after age 78. Talk to your health care provider about which screenings and vaccines you need and how often you need them. This information is not intended to replace advice given to you by your health care provider. Make sure you discuss any questions you have with your health care provider. Document Released: 02/05/2015 Document Revised: 09/29/2015 Document Reviewed: 11/10/2014 Elsevier Interactive Patient Education  2017 [employer].  Fall Prevention in the Home Falls can cause injuries. They can happen to people of all ages. There are many things you can do to make your home safe and to help prevent falls. What can I do on the outside of my home? Regularly fix the edges of walkways and driveways and fix any cracks. Remove anything that might make you trip as you walk through a door, such as a raised step or threshold. Trim any bushes or trees on the path to your home. Use bright outdoor lighting. Clear any walking paths of anything that might make someone trip, such as rocks or tools. Regularly check to see if handrails are loose or broken. Make sure that both sides of any steps have handrails. Any raised decks and porches should have guardrails on the edges. Have any leaves, snow, or ice cleared regularly. Use sand or salt on walking paths during winter. Clean up any spills in your garage right away. This includes oil or grease spills. What can I do in the bathroom? Use night lights. Install grab bars by the toilet and in the tub and shower. Do not use towel bars as grab bars. Use non-skid mats or decals in the tub or shower. If you need to sit down in the shower, use a plastic, non-slip stool. Keep the floor dry. Clean up any water that spills on the floor  as soon as it happens. Remove soap buildup in the tub or shower regularly. Attach bath mats securely with double-sided non-slip rug tape. Do not have throw rugs and other things on the floor that can make you trip. What can I do in the bedroom? Use night lights. Make sure that you have a light by your bed that is easy to reach. Do not use any sheets or blankets that are too big for your bed. They should not hang down onto the floor. Have a firm chair that has side arms. You can use this for support while you get dressed. Do not have throw rugs and other things on the floor that can make you trip. What can I do in the kitchen? Clean up any spills right away. Avoid walking on wet floors. Keep items that you use a lot in easy-to-reach places. If you need to reach something above you, use a strong step stool that has a grab bar. Keep electrical cords out of the way. Do not use floor polish or wax that makes floors slippery. If you must use wax, use non-skid floor wax. Do not have throw rugs and other things on the floor that can make you trip. What can I do with my stairs? Do not leave  any items on the stairs. Make sure that there are handrails on both sides of the stairs and use them. Fix handrails that are broken or loose. Make sure that handrails are as long as the stairways. Check any carpeting to make sure that it is firmly attached to the stairs. Fix any carpet that is loose or worn. Avoid having throw rugs at the top or bottom of the stairs. If you do have throw rugs, attach them to the floor with carpet tape. Make sure that you have a light switch at the top of the stairs and the bottom of the stairs. If you do not have them, ask someone to add them for you. What else can I do to help prevent falls? Wear shoes that: Do not have high heels. Have rubber bottoms. Are comfortable and fit you well. Are closed at the toe. Do not wear sandals. If you use a stepladder: Make sure that it is  fully opened. Do not climb a closed stepladder. Make sure that both sides of the stepladder are locked into place. Ask someone to hold it for you, if possible. Clearly mark and make sure that you can see: Any grab bars or handrails. First and last steps. Where the edge of each step is. Use tools that help you move around (mobility aids) if they are needed. These include: Canes. Walkers. Scooters. Crutches. Turn on the lights when you go into a dark area. Replace any light bulbs as soon as they burn out. Set up your furniture so you have a clear path. Avoid moving your furniture around. If any of your floors are uneven, fix them. If there are any pets around you, be aware of where they are. Review your medicines with your doctor. Some medicines can make you feel dizzy. This can increase your chance of falling. Ask your doctor what other things that you can do to help prevent falls. This information is not intended to replace advice given to you by your health care provider. Make sure you discuss any questions you have with your health care provider. Document Released: 11/05/2008 Document Revised: 06/17/2015 Document Reviewed: 02/13/2014 Elsevier Interactive Patient Education  2017 [employer].

## 2022-09-11 NOTE — Progress Notes (Signed)
Subjective:   Cathy Meyers is a 77 y.o. female who presents for Medicare Annual (Subsequent) preventive examination.  Visit Complete: Virtual  I connected with  Cathy Meyers on 09/11/22 by a audio enabled telemedicine application and verified that I am speaking with the correct person using two identifiers.  Patient Location: Home  Provider Location: Home Office  I discussed the limitations of evaluation and management by telemedicine. The patient expressed understanding and agreed to proceed.  Patient Medicare AWV questionnaire was completed by the patient on ; I have confirmed that all information answered by patient is correct and no changes since this date.  Review of Systems    Vital Signs: Unable to obtain new vitals due to this being a telehealth visit.  Cardiac Risk Factors include: advanced age (>1men, >35 women)     Objective:    Today's Vitals   09/11/22 1341  Weight: 184 lb (83.5 kg)  Height: 5\' 3"  (1.6 m)   Body mass index is 32.59 kg/m.     09/11/2022    1:50 PM 09/05/2021    9:58 AM 08/16/2020    1:10 PM 08/22/2019    2:53 PM 08/08/2017    9:36 AM 02/28/2017   10:02 AM 02/25/2016    3:23 PM  Advanced Directives  Does Patient Have a Medical Advance Directive? No Yes Yes Yes Yes No --  Type of Special educational needs teacher of [place];Living will Healthcare Power of [place];Living will Healthcare Power of [place];Living will     Does patient want to make changes to medical advance directive?  No - Patient declined  No - Patient declined     Copy of Healthcare Power of Attorney in Chart?  No - copy requested No - copy requested No - copy requested     Would patient like information on creating a medical advance directive? No - Patient declined     Yes (MAU/Ambulatory/Procedural Areas - Information given)     Current Medications (verified) Outpatient Encounter Medications as of 09/11/2022  Medication Sig   Ascorbic Acid (VITAMIN C) 1000  MG tablet Vitamin C   Aspirin-Acetaminophen-Caffeine (EXCEDRIN PO) Take by mouth as needed. 500mg    Cholecalciferol (VITAMIN D) 50 MCG (2000 UT) CAPS Vitamin D   fluticasone (FLONASE) 50 MCG/ACT nasal spray SPRAY 2 SPRAYS INTO EACH NOSTRIL EVERY DAY   ibuprofen (ADVIL,MOTRIN) 600 MG tablet Take 1 tablet (600 mg total) by mouth every 6 (six) hours as needed.   Multiple Vitamin (MULTIVITAMIN) tablet Take 1 tablet by mouth daily.     naproxen sodium (ALEVE) 220 MG tablet Take 440 mg by mouth daily as needed (for headache).   OVER THE COUNTER MEDICATION PeneTrex. Rubbing ointment for arthritis pain.   No facility-administered encounter medications on file as of 09/11/2022.    Allergies (verified) Penicillins   History: Past Medical History:  Diagnosis Date   Allergy    Anemia    Chronic kidney disease    Gastritis    EGD neg bx 2011    Headache    otc med prn   Hiatal hernia    small on egd   History of varicella    Hx: UTI (urinary tract infection)    pyelo   Hyperlipidemia    diet controlled    Pyelonephritis 06/04/2010   Shingles 11/24/2011   Classic typical symptoms expectant management and treatment begin antiviral. Discussed pain control options    Sickle cell trait (HCC)    SVD (spontaneous vaginal  delivery)    x 3   Past Surgical History:  Procedure Laterality Date   COLONOSCOPY     DILATION AND CURETTAGE OF UTERUS  06/2016   HYSTEROSCOPY WITH D & C N/A 03/02/2017   Procedure: DILATATION AND CURETTAGE /HYSTEROSCOPY;  Surgeon: Mitchel Honour, DO;  Location: [facility];  Service: Gynecology;  Laterality: N/A;   UPPER GASTROINTESTINAL ENDOSCOPY     WISDOM TOOTH EXTRACTION     Family History  Adopted: Yes  Problem Relation Age of Onset   Hypertension Mother        family hx   Heart failure Mother    Colon cancer Neg Hx    Colon polyps Neg Hx    Esophageal cancer Neg Hx    Rectal cancer Neg Hx    Stomach cancer Neg Hx    Social History   Socioeconomic History    Marital status: Married    Spouse name: Not on file   Number of children: Not on file   Years of education: Not on file   Highest education level: Not on file  Occupational History   Not on file  Tobacco Use   Smoking status: Former    Current packs/day: 0.00    Types: Cigarettes    Quit date: 75    Years since quitting: 26.6   Smokeless tobacco: Never   Tobacco comments:    smoked in school periodically   Vaping Use   Vaping status: Never Used  Substance and Sexual Activity   Alcohol use: Yes    Comment: socially   Drug use: No   Sexual activity: Not Currently    Birth control/protection: Post-menopausal  Other Topics Concern   Not on file  Social History Narrative   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   Hx of divorce    No pets    Raised by family member not mom   [place]   G3P3   HHof 2  Husband has DM   - 7 hours of sleep    Social Determinants of Health   Financial Resource Strain: Low Risk  (09/11/2022)   Overall Financial Resource Strain (CARDIA)    Difficulty of Paying Living Expenses: Not hard at all  Food Insecurity: No Food Insecurity (09/11/2022)   Hunger Vital Sign    Worried About Running Out of Food in the Last Year: Never true    Ran Out of Food in the Last Year: Never true  Transportation Needs: No Transportation Needs (09/11/2022)   PRAPARE - Administrator, Civil Service (Medical): No    Lack of Transportation (Non-Medical): No  Physical Activity: Sufficiently Active (09/11/2022)   Exercise Vital Sign    Days of Exercise per Week: 2 days    Minutes of Exercise per Session: 120 min  Recent Concern: Physical Activity - Insufficiently Active (08/16/2022)   Exercise Vital Sign    Days of Exercise per Week: 2 days    Minutes of Exercise per Session: 60 min  Stress: No Stress Concern Present (09/11/2022)   [employer] of Occupational Health - Occupational Stress Questionnaire    Feeling of Stress : Not  at all  Social Connections: Socially Integrated (09/11/2022)   Social Connection and Isolation Panel [NHANES]    Frequency of Communication with Friends and Family: More than three times a week    Frequency of Social Gatherings with Friends and Family: More than three times a week  Attends Religious Services: More than 4 times per year    Active Member of Clubs or Organizations: Yes    Attends Engineer, structural: More than 4 times per year    Marital Status: Married    Tobacco Counseling Counseling given: Not Answered Tobacco comments: smoked in school periodically    Clinical Intake:  Pre-visit preparation completed: Yes  Pain : No/denies pain     BMI - recorded: 32.59 Nutritional Risks: None Diabetes: No  How often do you need to have someone help you when you read instructions, pamphlets, or other written materials from your doctor or pharmacy?: 1 - Never  Interpreter Needed?: No  Information entered by :: Theresa Mulligan LPN   Activities of Daily Living    09/11/2022    1:48 PM  In your present state of health, do you have any difficulty performing the following activities:  Hearing? 0  Vision? 0  Difficulty concentrating or making decisions? 0  Walking or climbing stairs? 0  Dressing or bathing? 0  Doing errands, shopping? 0  Preparing Food and eating ? N  Using the Toilet? N  In the past six months, have you accidently leaked urine? N  Do you have problems with loss of bowel control? N  Managing your Medications? N  Managing your Finances? N  Housekeeping or managing your Housekeeping? N    Patient Care Team: Panosh, Neta Mends, MD as PCP - General Meryl Dare, MD as Consulting Physician (Gastroenterology) Mitchel Honour, DO as Consulting Physician (Obstetrics and Gynecology)  Indicate any recent Medical Services you may have received from other than [facility] providers in the past year (date may be approximate).     Assessment:   This is a  routine wellness examination for [place].  Hearing/Vision screen Hearing Screening - Comments:: Denies hearing difficulties   Vision Screening - Comments:: Wears rx glasses - up to date with routine eye exams with  [facility]  Dietary issues and exercise activities discussed:     Goals Addressed               This Visit's Progress     Patient Stated (pt-stated)        Keep monitoring my diet by eating well.       Depression Screen    09/11/2022    1:48 PM 08/16/2022    9:30 AM 09/05/2021    9:54 AM 08/08/2021   11:05 AM 08/16/2020    1:11 PM 08/03/2020   11:22 AM 08/22/2019    3:03 PM  PHQ 2/9 Scores  PHQ - 2 Score 0 0 0 0 0 0 0  PHQ- 9 Score 0 2 0 2  2 0    Fall Risk    09/11/2022    1:49 PM 08/16/2022    9:30 AM 09/05/2021    9:57 AM 08/08/2021   11:05 AM 08/16/2020    1:11 PM  Fall Risk   Falls in the past year? 0 0 0 0 0  Number falls in past yr: 0 0 0 0 0  Injury with Fall? 0 0 0 0 0  Risk for fall due to : No Fall Risks No Fall Risks No Fall Risks No Fall Risks   Follow up Falls prevention discussed Falls evaluation completed  Falls prevention discussed Falls evaluation completed    MEDICARE RISK AT HOME: Medicare Risk at Home Any stairs in or around the home?: No If so, are there any without handrails?: No  Home free of loose throw rugs in walkways, pet beds, electrical cords, etc?: Yes Adequate lighting in your home to reduce risk of falls?: Yes Life alert?: No Use of a cane, walker or w/c?: No Grab bars in the bathroom?: Yes Shower chair or bench in shower?: No Elevated toilet seat or a handicapped toilet?: No  TIMED UP AND GO:  Was the test performed?  No    Cognitive Function:    08/08/2017    9:47 AM  MMSE - Mini Mental State Exam  Not completed: --        09/11/2022    1:50 PM 09/05/2021    9:59 AM  6CIT Screen  What Year? 0 points 0 points  What month? 0 points 0 points  What time? 0 points 0 points  Count back from 20 0 points 0  points  Months in reverse 0 points 0 points  Repeat phrase 0 points 0 points  Total Score 0 points 0 points    Immunizations Immunization History  Administered Date(s) Administered   Influenza Split 11/21/2011, 10/24/2013   Influenza, High Dose Seasonal PF 09/24/2015, 10/05/2016, 11/08/2020   Influenza,inj,Quad PF,6+ Mos 10/25/2012, 10/09/2014   PFIZER(Purple Top)SARS-COV-2 Vaccination 02/10/2019, 02/28/2019, 10/22/2019, 08/09/2020   Pneumococcal Conjugate-13 10/08/2013   Pneumococcal Polysaccharide-23 08/14/2012   Td 01/24/2004   Zoster Recombinant(Shingrix) 05/16/2017, 08/07/2018, 12/12/2018   Zoster, Live 05/02/2011    TDAP status: Due, Education has been provided regarding the importance of this vaccine. Advised may receive this vaccine at local pharmacy or Health Dept. Aware to provide a copy of the vaccination record if obtained from local pharmacy or Health Dept. Verbalized acceptance and understanding.  Flu Vaccine status: Due, Education has been provided regarding the importance of this vaccine. Advised may receive this vaccine at local pharmacy or Health Dept. Aware to provide a copy of the vaccination record if obtained from local pharmacy or Health Dept. Verbalized acceptance and understanding.  Pneumococcal vaccine status: Up to date  Covid-19 vaccine status: Declined, Education has been provided regarding the importance of this vaccine but patient still declined. Advised may receive this vaccine at local pharmacy or Health Dept.or vaccine clinic. Aware to provide a copy of the vaccination record if obtained from local pharmacy or Health Dept. Verbalized acceptance and understanding.  Qualifies for Shingles Vaccine? Yes   Zostavax completed Yes   Shingrix Completed?: Yes  Screening Tests Health Maintenance  Topic Date Due   DTaP/Tdap/Td (2 - Tdap) 01/23/2014   COVID-19 Vaccine (5 - 2023-24 season) 09/23/2021   INFLUENZA VACCINE  08/24/2022   MAMMOGRAM  11/19/2022    Medicare Annual Wellness (AWV)  09/11/2023   Pneumonia Vaccine 77+ Years old  Completed   DEXA SCAN  Completed   Hepatitis C Screening  Completed   Zoster Vaccines- Shingrix  Completed   HPV VACCINES  Aged Out   Colonoscopy  Discontinued    Health Maintenance  Health Maintenance Due  Topic Date Due   DTaP/Tdap/Td (2 - Tdap) 01/23/2014   COVID-19 Vaccine (5 - 2023-24 season) 09/23/2021   INFLUENZA VACCINE  08/24/2022      Mammogram status: Completed 11/18/21. Repeat every year  Bone Density status: Completed 12/05/19. Results reflect: Bone density results: OSTEOPENIA. Repeat every   years.  Lung Cancer Screening: (Low Dose CT Chest recommended if Age 59-80 years, 20 pack-year currently smoking OR have quit w/in 15years.) does not qualify.     Additional Screening:  Hepatitis C Screening: does qualify; Completed 02/25/16  Vision Screening:  Recommended annual ophthalmology exams for early detection of glaucoma and other disorders of the eye. Is the patient up to date with their annual eye exam?  Yes  Who is the provider or what is the name of the office in which the patient attends annual eye exams? [facility] If pt is not established with a provider, would they like to be referred to a provider to establish care? No .   Dental Screening: Recommended annual dental exams for proper oral hygiene   Community Resource Referral / Chronic Care Management:  CRR required this visit?  No   CCM required this visit?  No     Plan:     I have personally reviewed and noted the following in the patient's chart:   Medical and social history Use of alcohol, tobacco or illicit drugs  Current medications and supplements including opioid prescriptions. Patient is not currently taking opioid prescriptions. Functional ability and status Nutritional status Physical activity Advanced directives List of other physicians Hospitalizations, surgeries, and ER visits in previous 12  months Vitals Screenings to include cognitive, depression, and falls Referrals and appointments  In addition, I have reviewed and discussed with patient certain preventive protocols, quality metrics, and best practice recommendations. A written personalized care plan for preventive services as well as general preventive health recommendations were provided to patient.     Tillie Rung, LPN   [id number]   After Visit Summary: (MyChart) Due to this being a telephonic visit, the after visit summary with patients personalized plan was offered to patient via MyChart   Nurse Notes: None

## 2022-10-16 ENCOUNTER — Other Ambulatory Visit: Payer: Self-pay | Admitting: Internal Medicine

## 2022-10-16 DIAGNOSIS — Z1231 Encounter for screening mammogram for malignant neoplasm of breast: Secondary | ICD-10-CM

## 2022-10-20 DIAGNOSIS — R300 Dysuria: Secondary | ICD-10-CM | POA: Diagnosis not present

## 2022-11-22 ENCOUNTER — Ambulatory Visit
Admission: RE | Admit: 2022-11-22 | Discharge: 2022-11-22 | Disposition: A | Discharge status: Home / Self Care | Payer: Medicare Other | Source: Ambulatory Visit | Attending: Internal Medicine | Admitting: Internal Medicine

## 2022-11-22 DIAGNOSIS — Z1231 Encounter for screening mammogram for malignant neoplasm of breast: Secondary | ICD-10-CM | POA: Diagnosis not present

## 2023-01-02 DIAGNOSIS — H40013 Open angle with borderline findings, low risk, bilateral: Secondary | ICD-10-CM | POA: Diagnosis not present

## 2023-01-02 DIAGNOSIS — H524 Presbyopia: Secondary | ICD-10-CM | POA: Diagnosis not present

## 2023-09-11 NOTE — Progress Notes (Unsigned)
 No chief complaint on file.   HPI: Patient  Cathy Meyers  78 y.o. comes in today for Preventive Health Care visit   Health Maintenance  Topic Date Due   DTaP/Tdap/Td (2 - Tdap) 01/23/2014   COVID-19 Vaccine (5 - 2024-25 season) 09/24/2022   Medicare Annual Wellness (AWV)  09/11/2023   INFLUENZA VACCINE  08/24/2023   MAMMOGRAM  11/22/2023   Pneumococcal Vaccine: 50+ Years  Completed   DEXA SCAN  Completed   Hepatitis C Screening  Completed   Zoster Vaccines- Shingrix   Completed   HPV VACCINES  Aged Out   Meningococcal B Vaccine  Aged Out   Colonoscopy  Discontinued   Health Maintenance Review LIFESTYLE:  Exercise:   Tobacco/ETS: Alcohol:  Sugar beverages: Sleep: Drug use: no HH of  Work:    ROS:  GEN/ HEENT: No fever, significant weight changes sweats headaches vision problems hearing changes, CV/ PULM; No chest pain shortness of breath cough, syncope,edema  change in exercise tolerance. GI /GU: No adominal pain, vomiting, change in bowel habits. No blood in the stool. No significant GU symptoms. SKIN/HEME: ,no acute skin rashes suspicious lesions or bleeding. No lymphadenopathy, nodules, masses.  NEURO/ PSYCH:  No neurologic signs such as weakness numbness. No depression anxiety. IMM/ Allergy: No unusual infections.  Allergy .   REST of 12 system review negative except as per HPI   Past Medical History:  Diagnosis Date   Allergy    Anemia    Chronic kidney disease    Gastritis    EGD neg bx 2011    Headache    otc med prn   Hiatal hernia    small on egd   History of varicella    Hx: UTI (urinary tract infection)    pyelo   Hyperlipidemia    diet controlled    Pyelonephritis 06/04/2010   Shingles 11/24/2011   Classic typical symptoms expectant management and treatment begin antiviral. Discussed pain control options    Sickle cell trait (HCC)    SVD (spontaneous vaginal delivery)    x 3    Past Surgical History:  Procedure Laterality Date    COLONOSCOPY     DILATION AND CURETTAGE OF UTERUS  06/2016   HYSTEROSCOPY WITH D & C N/A 03/02/2017   Procedure: DILATATION AND CURETTAGE /HYSTEROSCOPY;  Surgeon: Dannielle Bouchard, DO;  Location: [facility];  Service: Gynecology;  Laterality: N/A;   UPPER GASTROINTESTINAL ENDOSCOPY     WISDOM TOOTH EXTRACTION      Family History  Adopted: Yes  Problem Relation Age of Onset   Hypertension Mother        family hx   Heart failure Mother    Colon cancer Neg Hx    Colon polyps Neg Hx    Esophageal cancer Neg Hx    Rectal cancer Neg Hx    Stomach cancer Neg Hx     Social History   Socioeconomic History   Marital status: Married    Spouse name: Not on file   Number of children: Not on file   Years of education: Not on file   Highest education level: Not on file  Occupational History   Not on file  Tobacco Use   Smoking status: Former    Current packs/day: 0.00    Types: Cigarettes    Quit date: 1998    Years since quitting: 27.6   Smokeless tobacco: Never   Tobacco comments:    smoked in school periodically  Vaping Use   Vaping status: Never Used  Substance and Sexual Activity   Alcohol use: Yes    Comment: socially   Drug use: No   Sexual activity: Not Currently    Birth control/protection: Post-menopausal  Other Topics Concern   Not on file  Social History Narrative   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   Hx of divorce    No pets    Raised by family member not mom   [place]   G3P3   HHof 2  Husband has DM   - 7 hours of sleep    Social Drivers of Corporate investment banker Strain: Low Risk  (09/11/2022)   Overall Financial Resource Strain (CARDIA)    Difficulty of Paying Living Expenses: Not hard at all  Food Insecurity: No Food Insecurity (09/11/2022)   Hunger Vital Sign    Worried About Running Out of Food in the Last Year: Never true    Ran Out of Food in the Last Year: Never true  Transportation Needs: No Transportation Needs  (09/11/2022)   PRAPARE - Administrator, Civil Service (Medical): No    Lack of Transportation (Non-Medical): No  Physical Activity: Sufficiently Active (09/11/2022)   Exercise Vital Sign    Days of Exercise per Week: 2 days    Minutes of Exercise per Session: 120 min  Recent Concern: Physical Activity - Insufficiently Active (08/16/2022)   Exercise Vital Sign    Days of Exercise per Week: 2 days    Minutes of Exercise per Session: 60 min  Stress: No Stress Concern Present (09/11/2022)   [employer] of Occupational Health - Occupational Stress Questionnaire    Feeling of Stress : Not at all  Social Connections: Socially Integrated (09/11/2022)   Social Connection and Isolation Panel    Frequency of Communication with Friends and Family: More than three times a week    Frequency of Social Gatherings with Friends and Family: More than three times a week    Attends Religious Services: More than 4 times per year    Active Member of [employer] or Organizations: Yes    Attends Engineer, structural: More than 4 times per year    Marital Status: Married    Outpatient Medications Prior to Visit  Medication Sig Dispense Refill   Ascorbic Acid (VITAMIN C) 1000 MG tablet Vitamin C     Aspirin-Acetaminophen -Caffeine (EXCEDRIN PO) Take by mouth as needed. 500mg      Cholecalciferol (VITAMIN D) 50 MCG (2000 UT) CAPS Vitamin D     fluticasone  (FLONASE ) 50 MCG/ACT nasal spray SPRAY 2 SPRAYS INTO EACH NOSTRIL EVERY DAY 1 g 3   ibuprofen  (ADVIL ,MOTRIN ) 600 MG tablet Take 1 tablet (600 mg total) by mouth every 6 (six) hours as needed. 30 tablet 0   Multiple Vitamin (MULTIVITAMIN) tablet Take 1 tablet by mouth daily.       naproxen sodium (ALEVE) 220 MG tablet Take 440 mg by mouth daily as needed (for headache).     OVER THE COUNTER MEDICATION PeneTrex. Rubbing ointment for arthritis pain.     No facility-administered medications prior to visit.     EXAM:  There were no vitals  taken for this visit.  There is no height or weight on file to calculate BMI. Wt Readings from Last 3 Encounters:  09/11/22 184 lb (83.5 kg)  08/16/22 187 lb 12.8 oz (85.2 kg)  09/05/21 180 lb (81.6 kg)  Physical Exam: Vital signs reviewed HZW:Uypd is a well-developed well-nourished alert cooperative    who appearsr stated age in no acute distress.  HEENT: normocephalic atraumatic , Eyes: PERRL EOM's full, conjunctiva clear, Nares: paten,t no deformity discharge or tenderness., Ears: no deformity EAC's clear TMs with normal landmarks. Mouth: clear OP, no lesions, edema.  Moist mucous membranes. Dentition in adequate repair. NECK: supple without masses, thyromegaly or bruits. CHEST/PULM:  Clear to auscultation and percussion breath sounds equal no wheeze , rales or rhonchi. No chest wall deformities or tenderness. Breast: normal by inspection . No dimpling, discharge, masses, tenderness or discharge . CV: PMI is nondisplaced, S1 S2 no gallops, murmurs, rubs. Peripheral pulses are full without delay.No JVD .  ABDOMEN: Bowel sounds normal nontender  No guard or rebound, no hepato splenomegal no CVA tenderness.  No hernia. Extremtities:  No clubbing cyanosis or edema, no acute joint swelling or redness no focal atrophy NEURO:  Oriented x3, cranial nerves 3-12 appear to be intact, no obvious focal weakness,gait within normal limits no abnormal reflexes or asymmetrical SKIN: No acute rashes normal turgor, color, no bruising or petechiae. PSYCH: Oriented, good eye contact, no obvious depression anxiety, cognition and judgment appear normal. LN: no cervical axillary inguinal adenopathy  Lab Results  Component Value Date   WBC 7.2 08/16/2022   HGB 12.2 08/16/2022   HCT 37.7 08/16/2022   PLT 297.0 08/16/2022   GLUCOSE 80 08/16/2022   CHOL 204 (H) 08/16/2022   TRIG 113.0 08/16/2022   HDL 66.30 08/16/2022   LDLDIRECT 122.4 04/25/2011   LDLCALC 115 (H) 08/16/2022   ALT 13 08/16/2022   AST  19 08/16/2022   NA 142 08/16/2022   K 3.7 08/16/2022   CL 107 08/16/2022   CREATININE 0.79 08/16/2022   BUN 7 08/16/2022   CO2 26 08/16/2022   TSH 2.71 08/16/2022   HGBA1C 5.5 08/16/2022    BP Readings from Last 3 Encounters:  08/16/22 128/80  08/08/21 120/76  08/16/20 128/72    Lab results reviewed with patient   ASSESSMENT AND PLAN:  Discussed the following assessment and plan:  No diagnosis found. No follow-ups on file.  Patient Care Team: Angle Karel, Apolinar POUR, MD as PCP - General Aneita Gwendlyn DASEN, MD (Inactive) as Consulting Physician (Gastroenterology) Dannielle Bouchard, DO as Consulting Physician (Obstetrics and Gynecology) There are no Patient Instructions on file for this visit.  Mikiah Demond K. Aashna Matson M.D.

## 2023-09-12 ENCOUNTER — Encounter: Payer: Self-pay | Admitting: Internal Medicine

## 2023-09-12 ENCOUNTER — Ambulatory Visit (INDEPENDENT_AMBULATORY_CARE_PROVIDER_SITE_OTHER): Admitting: Internal Medicine

## 2023-09-12 VITALS — BP 122/84 | HR 71 | Temp 98.1°F | Ht 63.0 in | Wt 176.8 lb

## 2023-09-12 DIAGNOSIS — Z79899 Other long term (current) drug therapy: Secondary | ICD-10-CM

## 2023-09-12 DIAGNOSIS — E785 Hyperlipidemia, unspecified: Secondary | ICD-10-CM | POA: Diagnosis not present

## 2023-09-12 DIAGNOSIS — M1710 Unilateral primary osteoarthritis, unspecified knee: Secondary | ICD-10-CM

## 2023-09-12 DIAGNOSIS — Z6831 Body mass index (BMI) 31.0-31.9, adult: Secondary | ICD-10-CM

## 2023-09-12 DIAGNOSIS — D573 Sickle-cell trait: Secondary | ICD-10-CM

## 2023-09-12 DIAGNOSIS — J302 Other seasonal allergic rhinitis: Secondary | ICD-10-CM | POA: Diagnosis not present

## 2023-09-12 NOTE — Patient Instructions (Signed)
 Lab appt  Continue lifestyle intervention healthy eating and exercise .

## 2023-09-14 ENCOUNTER — Telehealth: Payer: Self-pay

## 2023-09-14 NOTE — Telephone Encounter (Signed)
 Contacted patient on preferred number for scheduled AWV. Patient stated unable to complete visit today will call back to reschedule.

## 2023-09-19 ENCOUNTER — Ambulatory Visit (INDEPENDENT_AMBULATORY_CARE_PROVIDER_SITE_OTHER)

## 2023-09-19 VITALS — Ht 63.0 in | Wt 176.0 lb

## 2023-09-19 DIAGNOSIS — Z0000 Encounter for general adult medical examination without abnormal findings: Secondary | ICD-10-CM

## 2023-09-19 NOTE — Progress Notes (Signed)
 Subjective:   Cathy Meyers is a 78 y.o. who presents for a Medicare Wellness preventive visit.  As a reminder, Annual Wellness Visits don't include a physical exam, and some assessments may be limited, especially if this visit is performed virtually. We may recommend an in-person follow-up visit with your provider if needed.  Visit Complete: Virtual I connected with  Cathy Meyers on 09/19/23 by a audio enabled telemedicine application and verified that I am speaking with the correct person using two identifiers.  Patient Location: Home  Provider Location: Home Office  I discussed the limitations of evaluation and management by telemedicine. The patient expressed understanding and agreed to proceed.  Vital Signs: Because this visit was a virtual/telehealth visit, some criteria may be missing or patient reported. Any vitals not documented were not able to be obtained and vitals that have been documented are patient reported.    Persons Participating in Visit: Patient.  AWV Questionnaire: No: Patient Medicare AWV questionnaire was not completed prior to this visit.  Cardiac Risk Factors include: advanced age (>53men, >19 women)     Objective:    Today's Vitals   09/19/23 1059  Weight: 176 lb (79.8 kg)  Height: 5' 3 (1.6 m)   Body mass index is 31.18 kg/m.     09/19/2023   11:04 AM 09/11/2022    1:50 PM 09/05/2021    9:58 AM 08/16/2020    1:10 PM 08/22/2019    2:53 PM 08/08/2017    9:36 AM 02/28/2017   10:02 AM  Advanced Directives  Does Patient Have a Medical Advance Directive? Yes No Yes Yes Yes Yes  No   Type of Estate agent of [place];Living will  Healthcare Power of [employer];Living will Healthcare Power of [place];Living will Healthcare Power of [place];Living will    Does patient want to make changes to medical advance directive?   No - Patient declined  No - Patient declined    Copy of Healthcare Power of Attorney in Chart? No -  copy requested  No - copy requested No - copy requested No - copy requested    Would patient like information on creating a medical advance directive?  No - Patient declined     Yes (MAU/Ambulatory/Procedural Areas - Information given)      Data saved with a previous flowsheet row definition    Current Medications (verified) Outpatient Encounter Medications as of 09/19/2023  Medication Sig   Ascorbic Acid (VITAMIN C) 1000 MG tablet Vitamin C   Aspirin-Acetaminophen -Caffeine (EXCEDRIN PO) Take by mouth as needed. 500mg    Calcium Carbonate-Vit D-Min (CALCIUM 1200 PO) Take by mouth in the morning and at bedtime.   Cholecalciferol (VITAMIN D) 50 MCG (2000 UT) CAPS Vitamin D   fluticasone  (FLONASE ) 50 MCG/ACT nasal spray SPRAY 2 SPRAYS INTO EACH NOSTRIL EVERY DAY   ibuprofen  (ADVIL ,MOTRIN ) 600 MG tablet Take 1 tablet (600 mg total) by mouth every 6 (six) hours as needed.   MAGNESIUM PO Take 500 mg by mouth daily.   Multiple Vitamin (MULTIVITAMIN) tablet Take 1 tablet by mouth daily.     naproxen sodium (ALEVE) 220 MG tablet Take 440 mg by mouth daily as needed (for headache).   OVER THE COUNTER MEDICATION PeneTrex. Rubbing ointment for arthritis pain.   No facility-administered encounter medications on file as of 09/19/2023.    Allergies (verified) Penicillins   History: Past Medical History:  Diagnosis Date   Allergy    Anemia    Chronic kidney disease  Gastritis    EGD neg bx 2011    Headache    otc med prn   Hiatal hernia    small on egd   History of varicella    Hx: UTI (urinary tract infection)    pyelo   Hyperlipidemia    diet controlled    Pyelonephritis 06/04/2010   Shingles 11/24/2011   Classic typical symptoms expectant management and treatment begin antiviral. Discussed pain control options    Sickle cell trait (HCC)    SVD (spontaneous vaginal delivery)    x 3   Past Surgical History:  Procedure Laterality Date   COLONOSCOPY     DILATION AND CURETTAGE OF  UTERUS  06/2016   HYSTEROSCOPY WITH D & C N/A 03/02/2017   Procedure: DILATATION AND CURETTAGE /HYSTEROSCOPY;  Surgeon: Dannielle Bouchard, DO;  Location: [facility];  Service: Gynecology;  Laterality: N/A;   UPPER GASTROINTESTINAL ENDOSCOPY     WISDOM TOOTH EXTRACTION     Family History  Adopted: Yes  Problem Relation Age of Onset   Hypertension Mother        family hx   Heart failure Mother    Colon cancer Neg Hx    Colon polyps Neg Hx    Esophageal cancer Neg Hx    Rectal cancer Neg Hx    Stomach cancer Neg Hx    Social History   Socioeconomic History   Marital status: Married    Spouse name: Not on file   Number of children: Not on file   Years of education: Not on file   Highest education level: Not on file  Occupational History   Not on file  Tobacco Use   Smoking status: Former    Current packs/day: 0.00    Types: Cigarettes    Quit date: 59    Years since quitting: 27.6   Smokeless tobacco: Never   Tobacco comments:    smoked in school periodically   Vaping Use   Vaping status: Never Used  Substance and Sexual Activity   Alcohol use: Yes    Comment: socially   Drug use: No   Sexual activity: Not Currently    Birth control/protection: Post-menopausal  Other Topics Concern   Not on file  Social History Narrative   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   Hx of divorce    No pets    Raised by family member not mom   [place]   G3P3   HHof 2  Husband has DM   - 7 hours of sleep    Social Drivers of Corporate investment banker Strain: Low Risk  (09/19/2023)   Overall Financial Resource Strain (CARDIA)    Difficulty of Paying Living Expenses: Not hard at all  Food Insecurity: No Food Insecurity (09/19/2023)   Hunger Vital Sign    Worried About Running Out of Food in the Last Year: Never true    Ran Out of Food in the Last Year: Never true  Transportation Needs: No Transportation Needs (09/19/2023)   PRAPARE - Doctor, general practice (Medical): No    Lack of Transportation (Non-Medical): No  Physical Activity: Insufficiently Active (09/19/2023)   Exercise Vital Sign    Days of Exercise per Week: 2 days    Minutes of Exercise per Session: 60 min  Stress: No Stress Concern Present (09/19/2023)   [employer] of Occupational Health - Occupational Stress Questionnaire    Feeling  of Stress: Not at all  Social Connections: Socially Integrated (09/19/2023)   Social Connection and Isolation Panel    Frequency of Communication with Friends and Family: More than three times a week    Frequency of Social Gatherings with Friends and Family: More than three times a week    Attends Religious Services: More than 4 times per year    Active Member of [employer] or Organizations: Yes    Attends Engineer, structural: More than 4 times per year    Marital Status: Married    Tobacco Counseling Counseling given: Not Answered Tobacco comments: smoked in school periodically     Clinical Intake:  Pre-visit preparation completed: Yes  Pain : No/denies pain     BMI - recorded: 32.18 Nutritional Status: BMI > 30  Obese Nutritional Risks: None Diabetes: No  Lab Results  Component Value Date   HGBA1C 5.5 08/16/2022   HGBA1C 5.7 08/08/2021   HGBA1C 6.9 09/14/2020     How often do you need to have someone help you when you read instructions, pamphlets, or other written materials from your doctor or pharmacy?: 1 - Never  Interpreter Needed?: No  Information entered by :: Rojelio Blush LPN   Activities of Daily Living     09/19/2023   11:03 AM  In your present state of health, do you have any difficulty performing the following activities:  Hearing? 0  Vision? 0  Difficulty concentrating or making decisions? 0  Walking or climbing stairs? 0  Dressing or bathing? 0  Doing errands, shopping? 0  Preparing Food and eating ? N  Using the Toilet? N  In the past six months, have you accidently  leaked urine? N  Do you have problems with loss of bowel control? N  Managing your Medications? N  Managing your Finances? N  Housekeeping or managing your Housekeeping? N    Patient Care Team: Panosh, Apolinar POUR, MD as PCP - General Aneita Gwendlyn DASEN, MD (Inactive) as Consulting Physician (Gastroenterology) Dannielle Bouchard, DO as Consulting Physician (Obstetrics and Gynecology)  I have updated your Care Teams any recent Medical Services you may have received from other providers in the past year.     Assessment:   This is a routine wellness examination for Cathy Meyers.  Hearing/Vision screen Hearing Screening - Comments:: Denies hearing difficulties   Vision Screening - Comments:: Wears rx glasses - up to date with routine eye exams with  [facility]   Goals Addressed               This Visit's Progress     Continue physical activity (pt-stated)        Remain active.       Depression Screen     09/19/2023   11:02 AM 09/12/2023   11:08 AM 09/11/2022    1:48 PM 08/16/2022    9:30 AM 09/05/2021    9:54 AM 08/08/2021   11:05 AM 08/16/2020    1:11 PM  PHQ 2/9 Scores  PHQ - 2 Score 0 0 0 0 0 0 0  PHQ- 9 Score   0 2 0 2     Fall Risk     09/19/2023   11:03 AM 09/12/2023   11:08 AM 09/11/2022    1:49 PM 08/16/2022    9:30 AM 09/05/2021    9:57 AM  Fall Risk   Falls in the past year? 0 0 0 0 0  Number falls in past yr: 0 0 0  0 0  Injury with Fall? 0 0 0 0 0  Risk for fall due to : No Fall Risks No Fall Risks No Fall Risks No Fall Risks No Fall Risks  Follow up Falls evaluation completed Falls evaluation completed Falls prevention discussed Falls evaluation completed     MEDICARE RISK AT HOME:  Medicare Risk at Home Any stairs in or around the home?: No If so, are there any without handrails?: No Home free of loose throw rugs in walkways, pet beds, electrical cords, etc?: Yes Adequate lighting in your home to reduce risk of falls?: Yes Life alert?: No Use of a cane,  walker or w/c?: No Grab bars in the bathroom?: Yes Shower chair or bench in shower?: Yes Elevated toilet seat or a handicapped toilet?: No  TIMED UP AND GO:  Was the test performed?  No  Cognitive Function: 6CIT completed    08/08/2017    9:47 AM  MMSE - Mini Mental State Exam  Not completed: --        09/19/2023   11:05 AM 09/11/2022    1:50 PM 09/05/2021    9:59 AM  6CIT Screen  What Year? 0 points 0 points 0 points  What month? 0 points 0 points 0 points  What time? 0 points 0 points 0 points  Count back from 20 0 points 0 points 0 points  Months in reverse 0 points 0 points 0 points  Repeat phrase 0 points 0 points 0 points  Total Score 0 points 0 points 0 points    Immunizations Immunization History  Administered Date(s) Administered   INFLUENZA, HIGH DOSE SEASONAL PF 09/24/2015, 10/05/2016, 11/08/2020, 10/04/2022   Influenza Split 11/21/2011, 10/24/2013   Influenza,inj,Quad PF,6+ Mos 10/25/2012, 10/09/2014   Moderna Covid-19 Fall Seasonal Vaccine 8yrs & older 12/20/2021, 10/18/2022   PFIZER(Purple Top)SARS-COV-2 Vaccination 02/10/2019, 02/28/2019, 10/22/2019, 11/21/2019, 08/09/2020   PNEUMOCOCCAL CONJUGATE-20 10/24/2021   Pneumococcal Conjugate-13 10/08/2013   Pneumococcal Polysaccharide-23 08/14/2012   Td 01/24/2004   Tdap 05/15/2022   Zoster Recombinant(Shingrix ) 05/16/2017, 08/07/2018, 12/12/2018   Zoster, Live 05/02/2011, 05/16/2017    Screening Tests Health Maintenance  Topic Date Due   COVID-19 Vaccine (8 - Pfizer risk 2024-25 season) 09/28/2023 (Originally 04/17/2023)   INFLUENZA VACCINE  04/22/2024 (Originally 08/24/2023)   MAMMOGRAM  11/22/2023   Medicare Annual Wellness (AWV)  09/18/2024   DTaP/Tdap/Td (3 - Td or Tdap) 05/14/2032   Pneumococcal Vaccine: 50+ Years  Completed   DEXA SCAN  Completed   Hepatitis C Screening  Completed   Zoster Vaccines- Shingrix   Completed   HPV VACCINES  Aged Out   Meningococcal B Vaccine  Aged Out   Colonoscopy   Discontinued    Health Maintenance  There are no preventive care reminders to display for this patient. Health Maintenance Items Addressed:   Additional Screening:  Vision Screening: Recommended annual ophthalmology exams for early detection of glaucoma and other disorders of the eye. Would you like a referral to an eye doctor? No    Dental Screening: Recommended annual dental exams for proper oral hygiene  Community Resource Referral / Chronic Care Management: CRR required this visit?  No   CCM required this visit?  No   Plan:    I have personally reviewed and noted the following in the patient's chart:   Medical and social history Use of alcohol, tobacco or illicit drugs  Current medications and supplements including opioid prescriptions. Patient is not currently taking opioid prescriptions. Functional ability and status Nutritional status  Physical activity Advanced directives List of other physicians Hospitalizations, surgeries, and ER visits in previous 12 months Vitals Screenings to include cognitive, depression, and falls Referrals and appointments  In addition, I have reviewed and discussed with patient certain preventive protocols, quality metrics, and best practice recommendations. A written personalized care plan for preventive services as well as general preventive health recommendations were provided to patient.   Rojelio LELON Blush, LPN   [id number]/72/[id number]   After Visit Summary: (MyChart) Due to this being a telephonic visit, the after visit summary with patients personalized plan was offered to patient via MyChart   Notes: Nothing significant to report at this time.

## 2023-09-19 NOTE — Patient Instructions (Signed)
 Cathy Meyers , Thank you for taking time out of your busy schedule to complete your Annual Wellness Visit with me. I enjoyed our conversation and look forward to speaking with you again next year. I, as well as your care team,  appreciate your ongoing commitment to your health goals. Please review the following plan we discussed and let me know if I can assist you in the future. Your Game plan/ To Do List    Referrals: If you haven't heard from the office you've been referred to, please reach out to them at the phone provided.   Follow up Visits: We will see or speak with you next year for your Next Medicare AWV with our clinical staff 09/24/24 @ 10:40a Have you seen your provider in the last 6 months (3 months if uncontrolled diabetes)?   Clinician Recommendations:  Aim for 30 minutes of exercise or brisk walking, 6-8 glasses of water, and 5 servings of fruits and vegetables each day.       This is a list of the screenings recommended for you:  Health Maintenance  Topic Date Due   COVID-19 Vaccine (8 - Pfizer risk 2024-25 season) 09/28/2023*   Flu Shot  04/22/2024*   Mammogram  11/22/2023   Medicare Annual Wellness Visit  09/18/2024   DTaP/Tdap/Td vaccine (3 - Td or Tdap) 05/14/2032   Pneumococcal Vaccine for age over 63  Completed   DEXA scan (bone density measurement)  Completed   Hepatitis C Screening  Completed   Zoster (Shingles) Vaccine  Completed   HPV Vaccine  Aged Out   Meningitis B Vaccine  Aged Out   Colon Cancer Screening  Discontinued  *Topic was postponed. The date shown is not the original due date.    Advanced directives: (Copy Requested) Please bring a copy of your health care power of attorney and living will to the office to be added to your chart at your convenience. You can mail to [facility] [address] [place], [place] [address] or email to [email or web address] Advance Care Planning is important because it:  [x]  Makes sure you  receive the medical care that is consistent with your values, goals, and preferences  [x]  It provides guidance to your family and loved ones and reduces their decisional burden about whether or not they are making the right decisions based on your wishes.  Follow the link provided in your after visit summary or read over the paperwork we have mailed to you to help you started getting your Advance Directives in place. If you need assistance in completing these, please reach out to us  so that we can help you!  See attachments for Preventive Care and Fall Prevention Tips.

## 2023-09-20 ENCOUNTER — Other Ambulatory Visit (INDEPENDENT_AMBULATORY_CARE_PROVIDER_SITE_OTHER)

## 2023-09-20 DIAGNOSIS — M1710 Unilateral primary osteoarthritis, unspecified knee: Secondary | ICD-10-CM

## 2023-09-20 DIAGNOSIS — Z79899 Other long term (current) drug therapy: Secondary | ICD-10-CM | POA: Diagnosis not present

## 2023-09-20 DIAGNOSIS — E785 Hyperlipidemia, unspecified: Secondary | ICD-10-CM

## 2023-09-20 DIAGNOSIS — Z6831 Body mass index (BMI) 31.0-31.9, adult: Secondary | ICD-10-CM

## 2023-09-20 DIAGNOSIS — D573 Sickle-cell trait: Secondary | ICD-10-CM | POA: Diagnosis not present

## 2023-09-20 LAB — HEMOGLOBIN A1C: Hgb A1c MFr Bld: 5.8 % (ref 4.6–6.5)

## 2023-09-20 LAB — CBC WITH DIFFERENTIAL/PLATELET
Basophils Absolute: 0 K/uL (ref 0.0–0.1)
Basophils Relative: 0.6 % (ref 0.0–3.0)
Eosinophils Absolute: 0.2 K/uL (ref 0.0–0.7)
Eosinophils Relative: 2.9 % (ref 0.0–5.0)
HCT: 37.3 % (ref 36.0–46.0)
Hemoglobin: 12.3 g/dL (ref 12.0–15.0)
Lymphocytes Relative: 24.3 % (ref 12.0–46.0)
Lymphs Abs: 1.4 K/uL (ref 0.7–4.0)
MCHC: 32.8 g/dL (ref 30.0–36.0)
MCV: 91 fl (ref 78.0–100.0)
Monocytes Absolute: 0.5 K/uL (ref 0.1–1.0)
Monocytes Relative: 8.3 % (ref 3.0–12.0)
Neutro Abs: 3.8 K/uL (ref 1.4–7.7)
Neutrophils Relative %: 63.9 % (ref 43.0–77.0)
Platelets: 352 K/uL (ref 150.0–400.0)
RBC: 4.1 Mil/uL (ref 3.87–5.11)
RDW: 14.2 % (ref 11.5–15.5)
WBC: 5.9 K/uL (ref 4.0–10.5)

## 2023-09-20 LAB — LIPID PANEL
Cholesterol: 207 mg/dL — ABNORMAL HIGH (ref 0–200)
HDL: 62.3 mg/dL (ref >39.00)
LDL Cholesterol: 125 mg/dL — ABNORMAL HIGH (ref 0–99)
NonHDL: 145.01
Total CHOL/HDL Ratio: 3
Triglycerides: 99 mg/dL (ref 0.0–149.0)
VLDL: 19.8 mg/dL (ref 0.0–40.0)

## 2023-09-20 LAB — COMPREHENSIVE METABOLIC PANEL WITH GFR
ALT: 13 U/L (ref 0–35)
AST: 18 U/L (ref 0–37)
Albumin: 4.1 g/dL (ref 3.5–5.2)
Alkaline Phosphatase: 101 U/L (ref 39–117)
BUN: 10 mg/dL (ref 6–23)
CO2: 27 meq/L (ref 19–32)
Calcium: 9.3 mg/dL (ref 8.4–10.5)
Chloride: 107 meq/L (ref 96–112)
Creatinine, Ser: 0.83 mg/dL (ref 0.40–1.20)
GFR: 68.13 mL/min (ref >60.00)
Glucose, Bld: 90 mg/dL (ref 70–99)
Potassium: 4.2 meq/L (ref 3.5–5.1)
Sodium: 145 meq/L (ref 135–145)
Total Bilirubin: 0.8 mg/dL (ref 0.2–1.2)
Total Protein: 7.2 g/dL (ref 6.0–8.3)

## 2023-09-20 LAB — TSH: TSH: 2.65 u[IU]/mL (ref 0.35–5.50)

## 2023-10-01 ENCOUNTER — Other Ambulatory Visit: Payer: Self-pay | Admitting: Internal Medicine

## 2023-10-01 DIAGNOSIS — Z1231 Encounter for screening mammogram for malignant neoplasm of breast: Secondary | ICD-10-CM

## 2023-11-23 ENCOUNTER — Ambulatory Visit

## 2023-11-28 ENCOUNTER — Ambulatory Visit
Admission: RE | Admit: 2023-11-28 | Discharge: 2023-11-28 | Disposition: A | Source: Ambulatory Visit | Attending: Internal Medicine | Admitting: Internal Medicine

## 2023-11-28 DIAGNOSIS — Z1231 Encounter for screening mammogram for malignant neoplasm of breast: Secondary | ICD-10-CM

## 2024-01-09 ENCOUNTER — Encounter (HOSPITAL_BASED_OUTPATIENT_CLINIC_OR_DEPARTMENT_OTHER): Payer: Self-pay | Admitting: *Deleted

## 2024-01-09 ENCOUNTER — Other Ambulatory Visit: Payer: Self-pay

## 2024-01-09 ENCOUNTER — Emergency Department (HOSPITAL_BASED_OUTPATIENT_CLINIC_OR_DEPARTMENT_OTHER)
Admission: EM | Admit: 2024-01-09 | Discharge: 2024-01-10 | Disposition: A | Discharge status: Home / Self Care | Attending: Emergency Medicine | Admitting: Emergency Medicine

## 2024-01-09 DIAGNOSIS — R42 Dizziness and giddiness: Secondary | ICD-10-CM | POA: Insufficient documentation

## 2024-01-09 DIAGNOSIS — N189 Chronic kidney disease, unspecified: Secondary | ICD-10-CM | POA: Insufficient documentation

## 2024-01-09 DIAGNOSIS — Z7982 Long term (current) use of aspirin: Secondary | ICD-10-CM | POA: Insufficient documentation

## 2024-01-09 NOTE — ED Triage Notes (Signed)
 Pt has had vertigo intermittently.  She states that this has happened to her almost every year since she was in her 20's.  Usually the vertigo goes away on her own but this time it was more significant.  She states that the room is spinning.  No focal weakness, no facial droop, no slurred speech or any neuro deficit.

## 2024-01-10 LAB — CBC WITH DIFFERENTIAL/PLATELET
Abs Immature Granulocytes: 0.02 K/uL (ref 0.00–0.07)
Basophils Absolute: 0 K/uL (ref 0.0–0.1)
Basophils Relative: 0 %
Eosinophils Absolute: 0.1 K/uL (ref 0.0–0.5)
Eosinophils Relative: 1 %
HCT: 35.7 % — ABNORMAL LOW (ref 36.0–46.0)
Hemoglobin: 12.1 g/dL (ref 12.0–15.0)
Immature Granulocytes: 0 %
Lymphocytes Relative: 14 %
Lymphs Abs: 1.2 K/uL (ref 0.7–4.0)
MCH: 30.4 pg (ref 26.0–34.0)
MCHC: 33.9 g/dL (ref 30.0–36.0)
MCV: 89.7 fL (ref 80.0–100.0)
Monocytes Absolute: 0.6 K/uL (ref 0.1–1.0)
Monocytes Relative: 7 %
Neutro Abs: 6.9 K/uL (ref 1.7–7.7)
Neutrophils Relative %: 78 %
Platelets: 290 K/uL (ref 150–400)
RBC: 3.98 MIL/uL (ref 3.87–5.11)
RDW: 13.8 % (ref 11.5–15.5)
WBC: 8.9 K/uL (ref 4.0–10.5)
nRBC: 0 % (ref 0.0–0.2)

## 2024-01-10 LAB — BASIC METABOLIC PANEL WITH GFR
Anion gap: 10 (ref 5–15)
BUN: 10 mg/dL (ref 8–23)
CO2: 25 mmol/L (ref 22–32)
Calcium: 10 mg/dL (ref 8.9–10.3)
Chloride: 107 mmol/L (ref 98–111)
Creatinine, Ser: 0.72 mg/dL (ref 0.44–1.00)
GFR, Estimated: >60 mL/min (ref >60)
Glucose, Bld: 110 mg/dL — ABNORMAL HIGH (ref 70–99)
Potassium: 3.9 mmol/L (ref 3.5–5.1)
Sodium: 142 mmol/L (ref 135–145)

## 2024-01-10 MED ORDER — MECLIZINE HCL 25 MG PO TABS: 25.0000 mg | ORAL_TABLET | Freq: Three times a day (TID) | ORAL | 0 refills | Status: AC | PRN

## 2024-01-10 MED ORDER — SODIUM CHLORIDE 0.9 % IV BOLUS
1000.0000 mL | Freq: Once | INTRAVENOUS | Status: AC
  Administered 2024-01-10: 01:00:00 1000 mL via INTRAVENOUS

## 2024-01-10 MED ORDER — ONDANSETRON HCL 4 MG/2ML IJ SOLN
4.0000 mg | Freq: Once | INTRAMUSCULAR | Status: AC
  Administered 2024-01-10: 01:00:00 4 mg via INTRAVENOUS
  Filled 2024-01-10: qty 2

## 2024-01-10 MED ORDER — MECLIZINE HCL 25 MG PO TABS
25.0000 mg | ORAL_TABLET | Freq: Once | ORAL | Status: AC
  Administered 2024-01-10: 01:00:00 25 mg via ORAL
  Filled 2024-01-10: qty 1

## 2024-01-10 NOTE — ED Provider Notes (Signed)
 [place] [facility] AT [facility] Provider Note   CSN: [id number] Arrival date & time: 01/09/24  2251     Patient presents with: Dizziness   Cathy Meyers is a 78 y.o. female.  {Add pertinent medical, surgical, social history, OB history to HPI:32947} Patient is a 78 year old female with past medical history of hyperlipidemia, anemia, chronic kidney disease, and intermittent vertigo for many years.  Patient presenting today with complaints of dizziness.  She describes a spinning sensation that began earlier today.  She tells me this happens to her about once a year since she was 79 years old.  She denies any headache or visual disturbances.  No weakness or numbness.  She does feel somewhat off balance.  Symptoms are worse with movement and changing position.       Prior to Admission medications  Medication Sig Start Date End Date Taking? Authorizing Provider  Ascorbic Acid (VITAMIN C) 1000 MG tablet Vitamin C    [provider]  Aspirin-Acetaminophen -Caffeine (EXCEDRIN PO) Take by mouth as needed. 500mg     [provider]  Calcium Carbonate-Vit D-Min (CALCIUM 1200 PO) Take by mouth in the morning and at bedtime.    [provider]  Cholecalciferol (VITAMIN D) 50 MCG (2000 UT) CAPS Vitamin D    [provider]  fluticasone  (FLONASE ) 50 MCG/ACT nasal spray SPRAY 2 SPRAYS INTO EACH NOSTRIL EVERY DAY 05/17/18   Panosh, Wanda K, MD  ibuprofen  (ADVIL ,MOTRIN ) 600 MG tablet Take 1 tablet (600 mg total) by mouth every 6 (six) hours as needed. 03/02/17   Morris, Duwaine, DO  MAGNESIUM PO Take 500 mg by mouth daily.    [provider]  Multiple Vitamin (MULTIVITAMIN) tablet Take 1 tablet by mouth daily.      [provider]  naproxen sodium (ALEVE) 220 MG tablet Take 440 mg by mouth daily as needed (for headache).    [provider]  OVER THE COUNTER MEDICATION PeneTrex. Rubbing ointment for arthritis pain.     [provider]    Allergies: Penicillins    Review of Systems  All other systems reviewed and are negative.   Updated Vital Signs BP (!) 149/86 (BP Location: Right Arm)   Pulse 81   Temp 97.9 F (36.6 C)   Resp 17   SpO2 99%   Physical Exam Vitals and nursing note reviewed.  Constitutional:      General: She is not in acute distress.    Appearance: She is well-developed. She is not diaphoretic.  HENT:     Head: Normocephalic and atraumatic.  Eyes:     Extraocular Movements: Extraocular movements intact.     Pupils: Pupils are equal, round, and reactive to light.  Cardiovascular:     Rate and Rhythm: Normal rate and regular rhythm.     Heart sounds: No murmur heard.    No friction rub. No gallop.  Pulmonary:     Effort: Pulmonary effort is normal. No respiratory distress.     Breath sounds: Normal breath sounds. No wheezing.  Abdominal:     General: Bowel sounds are normal. There is no distension.     Palpations: Abdomen is soft.     Tenderness: There is no abdominal tenderness.  Musculoskeletal:        General: Normal range of motion.     Cervical back: Normal range of motion and neck supple.  Skin:    General: Skin is warm and dry.  Neurological:     General:  No focal deficit present.     Mental Status: She is alert and oriented to person, place, and time.     Cranial Nerves: No cranial nerve deficit.     Motor: No weakness.     (all labs ordered are listed, but only abnormal results are displayed) Labs Reviewed - No data to display  EKG: None  Radiology: No results found.  {Document cardiac monitor, telemetry assessment procedure when appropriate:32947} Procedures   Medications Ordered in the ED  sodium chloride  0.9 % bolus 1,000 mL (has no administration in time range)  ondansetron  (ZOFRAN ) injection 4 mg (has no administration in time range)  meclizine  (ANTIVERT ) tablet 25 mg (has no administration in time range)      {Click here for  ABCD2, HEART and other calculators REFRESH Note before signing:1}                              Medical Decision Making Amount and/or Complexity of Data Reviewed Labs: ordered.  Risk Prescription drug management.   ***  {Document critical care time when appropriate  Document review of labs and clinical decision tools ie CHADS2VASC2, etc  Document your independent review of radiology images and any outside records  Document your discussion with family members, caretakers and with consultants  Document social determinants of health affecting pt's care  Document your decision making why or why not admission, treatments were needed:32947:::1}   Final diagnoses:  None    ED Discharge Orders     None

## 2024-01-10 NOTE — Discharge Instructions (Signed)
 Begin taking meclizine  as prescribed as needed for dizziness.  Follow-up with primary doctor if symptoms persist, and return to the ER if symptoms significantly worsen or change.

## 2024-01-10 NOTE — ED Notes (Signed)
 Pt ambulatory to and from restroom without incident. No dizziness reported.

## 2024-09-24 ENCOUNTER — Ambulatory Visit
